# Patient Record
Sex: Female | Born: 1975 | Hispanic: Yes | Marital: Single | Smoking: Former smoker
Health system: Southern US, Community
[De-identification: ages and names within clinical notes are randomized; demographics above are authoritative.]

## PROBLEM LIST (undated history)

## (undated) DIAGNOSIS — I10 Essential (primary) hypertension: Secondary | ICD-10-CM

## (undated) DIAGNOSIS — D509 Iron deficiency anemia, unspecified: Secondary | ICD-10-CM

## (undated) DIAGNOSIS — M5136 Other intervertebral disc degeneration, lumbar region: Secondary | ICD-10-CM

## (undated) DIAGNOSIS — Z87442 Personal history of urinary calculi: Secondary | ICD-10-CM

## (undated) DIAGNOSIS — R011 Cardiac murmur, unspecified: Secondary | ICD-10-CM

## (undated) DIAGNOSIS — Z9889 Other specified postprocedural states: Secondary | ICD-10-CM

## (undated) DIAGNOSIS — F329 Major depressive disorder, single episode, unspecified: Secondary | ICD-10-CM

## (undated) DIAGNOSIS — F32A Depression, unspecified: Secondary | ICD-10-CM

## (undated) DIAGNOSIS — D649 Anemia, unspecified: Secondary | ICD-10-CM

## (undated) DIAGNOSIS — F419 Anxiety disorder, unspecified: Secondary | ICD-10-CM

## (undated) DIAGNOSIS — G4730 Sleep apnea, unspecified: Secondary | ICD-10-CM

## (undated) DIAGNOSIS — Z9289 Personal history of other medical treatment: Secondary | ICD-10-CM

## (undated) DIAGNOSIS — M5126 Other intervertebral disc displacement, lumbar region: Secondary | ICD-10-CM

## (undated) DIAGNOSIS — K219 Gastro-esophageal reflux disease without esophagitis: Secondary | ICD-10-CM

## (undated) DIAGNOSIS — Z973 Presence of spectacles and contact lenses: Secondary | ICD-10-CM

## (undated) DIAGNOSIS — M5030 Other cervical disc degeneration, unspecified cervical region: Secondary | ICD-10-CM

## (undated) DIAGNOSIS — R112 Nausea with vomiting, unspecified: Secondary | ICD-10-CM

## (undated) DIAGNOSIS — M5020 Other cervical disc displacement, unspecified cervical region: Secondary | ICD-10-CM

## (undated) DIAGNOSIS — M51369 Other intervertebral disc degeneration, lumbar region without mention of lumbar back pain or lower extremity pain: Secondary | ICD-10-CM

## (undated) DIAGNOSIS — Z8489 Family history of other specified conditions: Secondary | ICD-10-CM

## (undated) HISTORY — DX: Cardiac murmur, unspecified: R01.1

## (undated) HISTORY — DX: Personal history of other medical treatment: Z92.89

## (undated) HISTORY — PX: ERCP: SHX60

## (undated) HISTORY — PX: TUBAL LIGATION: SHX77

## (undated) HISTORY — PX: ELBOW SURGERY: SHX618

## (undated) HISTORY — DX: Iron deficiency anemia, unspecified: D50.9

## (undated) HISTORY — PX: DILATION AND CURETTAGE OF UTERUS: SHX78

## (undated) HISTORY — DX: Anemia, unspecified: D64.9

## (undated) HISTORY — PX: OTHER SURGICAL HISTORY: SHX169

---

## 2003-06-04 DIAGNOSIS — R011 Cardiac murmur, unspecified: Secondary | ICD-10-CM

## 2003-06-04 DIAGNOSIS — Z9289 Personal history of other medical treatment: Secondary | ICD-10-CM

## 2003-06-04 HISTORY — DX: Cardiac murmur, unspecified: R01.1

## 2003-06-04 HISTORY — DX: Personal history of other medical treatment: Z92.89

## 2006-02-15 ENCOUNTER — Other Ambulatory Visit: Payer: Self-pay

## 2006-02-15 ENCOUNTER — Emergency Department: Payer: Self-pay | Admitting: Emergency Medicine

## 2006-04-22 ENCOUNTER — Encounter: Payer: Self-pay | Admitting: Cardiovascular Disease

## 2007-01-22 ENCOUNTER — Emergency Department: Payer: Self-pay | Admitting: Emergency Medicine

## 2007-01-22 ENCOUNTER — Other Ambulatory Visit: Payer: Self-pay

## 2007-03-02 ENCOUNTER — Ambulatory Visit: Payer: Self-pay | Admitting: Family Medicine

## 2007-05-03 ENCOUNTER — Emergency Department: Payer: Self-pay | Admitting: Internal Medicine

## 2007-09-23 ENCOUNTER — Ambulatory Visit: Payer: Self-pay | Admitting: Gastroenterology

## 2008-06-03 HISTORY — PX: RENAL ENDOSCOPY VIA NEPHROSTOMY / PYELOSTOMY: SUR446

## 2008-06-03 HISTORY — PX: COLONOSCOPY: SHX174

## 2008-06-21 ENCOUNTER — Emergency Department: Payer: Self-pay | Admitting: Emergency Medicine

## 2008-06-30 ENCOUNTER — Ambulatory Visit: Payer: Self-pay | Admitting: Urology

## 2008-07-08 ENCOUNTER — Ambulatory Visit: Payer: Self-pay | Admitting: Urology

## 2008-07-14 ENCOUNTER — Ambulatory Visit: Payer: Self-pay | Admitting: Urology

## 2008-07-18 ENCOUNTER — Ambulatory Visit: Payer: Self-pay | Admitting: Urology

## 2008-07-21 ENCOUNTER — Ambulatory Visit: Payer: Self-pay | Admitting: Unknown Physician Specialty

## 2008-09-01 ENCOUNTER — Emergency Department: Payer: Self-pay

## 2008-09-16 DIAGNOSIS — N8000 Endometriosis of the uterus, unspecified: Secondary | ICD-10-CM | POA: Insufficient documentation

## 2008-10-06 ENCOUNTER — Ambulatory Visit: Payer: Self-pay | Admitting: Family Medicine

## 2008-11-04 ENCOUNTER — Ambulatory Visit: Payer: Self-pay | Admitting: Family Medicine

## 2008-12-21 ENCOUNTER — Ambulatory Visit: Payer: Self-pay | Admitting: Internal Medicine

## 2008-12-28 ENCOUNTER — Ambulatory Visit: Payer: Self-pay | Admitting: Gastroenterology

## 2009-01-04 ENCOUNTER — Encounter: Admission: RE | Admit: 2009-01-04 | Discharge: 2009-01-04 | Payer: Self-pay | Admitting: Neurological Surgery

## 2009-03-03 ENCOUNTER — Ambulatory Visit: Payer: Self-pay | Admitting: Urology

## 2009-05-10 ENCOUNTER — Ambulatory Visit: Payer: Self-pay | Admitting: Unknown Physician Specialty

## 2009-05-18 ENCOUNTER — Ambulatory Visit: Payer: Self-pay | Admitting: Urology

## 2009-06-14 ENCOUNTER — Ambulatory Visit: Payer: Self-pay | Admitting: Gastroenterology

## 2009-06-17 ENCOUNTER — Ambulatory Visit: Payer: Self-pay | Admitting: Family Medicine

## 2009-07-19 ENCOUNTER — Ambulatory Visit: Payer: Self-pay | Admitting: Family Medicine

## 2009-07-25 ENCOUNTER — Ambulatory Visit: Payer: Self-pay | Admitting: Gastroenterology

## 2009-07-25 LAB — HM COLONOSCOPY

## 2009-07-26 ENCOUNTER — Encounter: Payer: Self-pay | Admitting: Cardiovascular Disease

## 2009-07-26 ENCOUNTER — Emergency Department: Payer: Self-pay | Admitting: Emergency Medicine

## 2009-08-01 ENCOUNTER — Ambulatory Visit: Payer: Self-pay | Admitting: Family Medicine

## 2009-08-03 ENCOUNTER — Ambulatory Visit: Payer: Self-pay | Admitting: Gastroenterology

## 2009-08-08 ENCOUNTER — Ambulatory Visit: Payer: Self-pay | Admitting: Unknown Physician Specialty

## 2009-10-05 ENCOUNTER — Ambulatory Visit: Payer: Self-pay | Admitting: Unknown Physician Specialty

## 2009-10-19 DIAGNOSIS — K219 Gastro-esophageal reflux disease without esophagitis: Secondary | ICD-10-CM | POA: Insufficient documentation

## 2009-11-21 ENCOUNTER — Ambulatory Visit: Payer: Self-pay

## 2009-12-15 ENCOUNTER — Ambulatory Visit: Payer: Self-pay | Admitting: Rheumatology

## 2009-12-18 ENCOUNTER — Ambulatory Visit: Payer: Self-pay | Admitting: Family Medicine

## 2009-12-29 ENCOUNTER — Ambulatory Visit: Payer: Self-pay | Admitting: Cardiovascular Disease

## 2009-12-29 DIAGNOSIS — R079 Chest pain, unspecified: Secondary | ICD-10-CM | POA: Insufficient documentation

## 2009-12-29 DIAGNOSIS — R0602 Shortness of breath: Secondary | ICD-10-CM | POA: Insufficient documentation

## 2010-01-01 ENCOUNTER — Encounter: Payer: Self-pay | Admitting: Cardiovascular Disease

## 2010-01-03 ENCOUNTER — Ambulatory Visit: Payer: Self-pay | Admitting: Cardiovascular Disease

## 2010-01-04 ENCOUNTER — Encounter: Payer: Self-pay | Admitting: Cardiovascular Disease

## 2010-01-17 ENCOUNTER — Telehealth: Payer: Self-pay | Admitting: Cardiovascular Disease

## 2010-03-23 ENCOUNTER — Telehealth: Payer: Self-pay | Admitting: Cardiovascular Disease

## 2010-04-19 ENCOUNTER — Telehealth: Payer: Self-pay | Admitting: Cardiovascular Disease

## 2010-06-03 HISTORY — PX: GASTRIC BYPASS: SHX52

## 2010-06-08 ENCOUNTER — Ambulatory Visit: Payer: Self-pay | Admitting: Specialist

## 2010-07-03 NOTE — Assessment & Plan Note (Signed)
Summary: NP6/GLC   Visit Type:  Initial Consult Primary Provider:  Nilda Simmer, M.D.  CC:  shortness of breath and chest tightness.  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 She presents for evaluation.  She reports that she has had significant weight gain over the past year, approximately 100 pounds. She does not think that this is contributing to new onset shortness of breath over the past several months. 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 when she walks outside of work, she has difficulty with shortness of breath and has to stop. She does not think it is too to general deconditioning.  She used to smoke but stopped in 2001. She smoked for total of 8 years  EKG shows normal sinus rhythm with rate 74 beats per minute, no significant ST or T wave changes. EKG from February 2011 and shows normal sinus rhythm with rate 69 beats per minute.  Preventive Screening-Counseling & Management  Alcohol-Tobacco     Smoking Status: quit  Caffeine-Diet-Exercise     Does Patient Exercise: no  Current Medications (verified): 1)  Protonix 40 Mg Tbec (Pantoprazole Sodium) .... Take 1 Tablet By Mouth Once A Day  Allergies (verified): No Known Drug Allergies  Past History:  Past Medical History: heart murmur 2005 stress test 2005 in [place] showed MR & TR  Past Surgical History: gallbladder removed Laproscopy colonoscopy-2010 endoscopy-2010 tubal ligation  Social History: Full Time Married  Tobacco Use - Former. 1PPD x 8 years. Quit 2001. Regular Exercise - no Smoking Status:  quit Does Patient Exercise:  no  Review of Systems       The patient complains of chest pain, dyspnea on exertion, and peripheral edema.  The  patient denies fever, weight loss, weight gain, vision loss, decreased hearing, hoarseness, syncope, prolonged cough, abdominal pain, incontinence, muscle weakness, depression, and enlarged lymph nodes.         Fatigue  Vital Signs:  Patient profile:   35 year old female Height:      69 inches Weight:      272 pounds BMI:     40.31 Pulse rate:   81 / minute BP sitting:   155 / 102  (right arm) Cuff size:   large  Vitals Entered By: Benedict Needy, RN (December 29, 2009 11:39 AM)  Physical Exam  General:  Well developed, well nourished, in no acute distress. Head:  normocephalic and atraumatic Neck:  Neck supple, no JVD. No masses, thyromegaly or abnormal cervical nodes. Lungs:  Clear bilaterally to auscultation and percussion. Heart:  Non-displaced PMI, chest non-tender; regular rate and rhythm, S1, S2 with I-II SEM RSB, no rubs or gallops. Carotid upstroke normal, no bruit.  Pedals normal pulses. No edema, no varicosities. Abdomen:  Bowel sounds positive; abdomen soft and non-tender without masses Msk:  Back normal, normal gait. Muscle strength and tone normal. Pulses:  pulses normal in all 4 extremities Extremities:  No clubbing or cyanosis. Neurologic:  Alert and oriented x 3. Skin:  Intact without lesions or rashes. Psych:  Normal affect.   Impression & Recommendations:  Problem # 1:  CHEST PAIN UNSPECIFIED (ICD-786.50) she has a recent history of chest discomfort. She reports it happens more commonly with exertion. She is low risk as she is  not a diabetic, not actively smoking, is young. No significant family history of coronary artery disease. Negative stress test by her report in 2005 which sounds like a treadmill echo.  We have set her up for a routine treadmill exercise study to evaluate her heart rate with exercise, as well as her blood pressure.  Orders: EKG w/ Interpretation (93000) Echocardiogram (Echo) Treadmill (Treadmill)  Problem # 2:  DYSPNEA  (ICD-786.05) Etiology of her shortness of breath is uncertain. We have ordered an echocardiogram given her murmur is new symptoms of shortness of breath. This could also be due to weight gain, general deconditioned state. No clinical signs of significant pulmonary hypertension and she has no significant edema though we will confirm her right heart pressures on echocardiography.  Orders: EKG w/ Interpretation (93000) Echocardiogram (Echo) Treadmill (Treadmill)  Patient Instructions: 1)  Your physician recommends that you continue on your current medications as directed. Please refer to the Current Medication list given to you today. 2)  Your physician has requested that you have an echocardiogram.  Echocardiography is a painless test that uses sound waves to create images of your heart. It provides your doctor with information about the size and shape of your heart and how well your heart's chambers and valves are working.  This procedure takes approximately one hour. There are no restrictions for this procedure. 3)  Your physician has requested that you have an exercise tolerance test.  For further information please visit [email or web address]  Please also follow instruction sheet, as given.  Appended Document: NP6/GLC her blood pressure on repeat was 145/90. We have suggested that she start monitoring her blood pressure and heart rate at work. If her blood pressure continues to be moderately elevated, we would suggest starting a medication for blood pressure. We would also recommend increased exercise and diet for weight loss.

## 2010-07-03 NOTE — Letter (Signed)
Summary: Historic Patient File  Historic Patient File   Imported By: West Carbo 12/29/2009 13:40:07  _____________________________________________________________________  External Attachment:    Type:   Image     Comment:   External Document

## 2010-07-03 NOTE — Letter (Signed)
Summary: External Correspondence  External Correspondence   Imported By: West Carbo 12/29/2009 14:23:46  _____________________________________________________________________  External Attachment:    Type:   Image     Comment:   External Document

## 2010-07-03 NOTE — Progress Notes (Signed)
Summary: BP questions  Phone Note Call from Patient Call back at Home Phone [phone]   Caller: Patient Call For: RN Summary of Call: pt called with complaints of being lightheaded last night and today " just hasn't felt right". Hasnt had a chance to keep record of BP. Would like nurse to call to discuss medications that might need to be changed. Initial call taken by: Lysbeth Galas CMA,  April 19, 2010 1:57 PM  Follow-up for Phone Call        Called spoke with pt, pt states she is on Metoprolol one tablet two times a day. She does not have BP cuff at home, so she cannot check BP regularly, but pt reports feeling lightheaded, and head pounding like a HA coming on BP checked at that time 156/85, pulse ox O2 sat good, but HR jumping around 60-80. Pt has also been dx with sleep apnea and has a CPAP machine at home, but is unable to tolerate at present has requested sleep aid from , but has to come in for appt first and has pending OV next week.  Pt isn't getting much sleep at present works 3rd shift and is only sleeping for 2-3 hours.  Advised pt these symptoms she is experiencing may very likely be coming from sleep deprivation, and quality of sleep as well as decr oxygenation while at rest.  Advised pt to address sleep issues with ASAP and try to monitor BP regularly at work is able to check at work 5 days a week 8 hr shift. Asked pt to check at beginning and end of shift consistantly with same BP cuff and record and call us with readings. Follow-up by: Cloyde Reams RN,  April 19, 2010 3:34 PM  Additional Follow-up for Phone Call Additional follow up Details #1::        Sounds good. Will talk to her in clinic

## 2010-07-03 NOTE — Progress Notes (Signed)
Summary: QUESTION FOR MD  Phone Note Call from Patient Call back at Home Phone [phone]   Caller: Patient Call For: GOLLAN Summary of Call: PATIENT CALLED AND SHE IS STILL FEELING SOB AND NOT SURE IF THE BP MEDICATIONS ARE WORKING, WOULD LIKE FOR RN TO CALL HER BACK. 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 Benedict Needy, RN  January 17, 2010 3:38 PM   bp 150/89 HR 99 pt to increase metoprolol 1 1/2 -2 tabs daily. Will monitor BP and call nurse with results.  Follow-up by: Benedict Needy, RN,  January 19, 2010 4:55 PM     Appended Document: QUESTION FOR MD Called to check on pt's BP and HR LMOM TCB

## 2010-07-03 NOTE — Progress Notes (Signed)
Summary: PROBLEMS  Phone Note Call from Patient Call back at Home Phone [phone]   Caller: self Call For: [facility] Summary of Call: PT CALLED A MONTH AGO ABOUT BP-WAS GIVEN A BP MEDICATION-DOES NOT FEEL LIKE MEDICATION IS HELPING-FEELS LIKE HR GOES UP AND DOWN WHILE SLEEPING-SOB DURING THE DAY Initial call taken by: Harlon Flor,  March 23, 2010 8:26 AM  Follow-up for Phone Call        Pt reports that sher BP has been in the 150's has been taking it but not writing it down. Asked pt to monitor her BP x2 day through the weekend and also record pulses and call me on monday. Benedict Needy, RN  March 23, 2010 10:21 AM   LMOM TCB Benedict Needy, RN  March 27, 2010 3:54 PM  Attempted TCB.  LMOM TCB. Cloyde Reams RN  March 30, 2010 4:47 PM   Follow-up by: Cloyde Reams RN,  March 30, 2010 4:47 PM

## 2010-07-03 NOTE — Letter (Signed)
Summary: Historic Patient File  Historic Patient File   Imported By: West Carbo 01/01/2010 09:54:47  _____________________________________________________________________  External Attachment:    Type:   Image     Comment:   External Document

## 2010-07-03 NOTE — Letter (Signed)
Summary: Historic Patient File  Historic Patient File   Imported By: West Carbo 01/01/2010 09:42:13  _____________________________________________________________________  External Attachment:    Type:   Image     Comment:   External Document

## 2010-07-03 NOTE — Miscellaneous (Signed)
Summary: Metoprolol  Clinical Lists Changes  Medications: Added new medication of METOPROLOL TARTRATE 25 MG TABS (METOPROLOL TARTRATE) Take one tablet by mouth twice a day - Signed Rx of METOPROLOL TARTRATE 25 MG TABS (METOPROLOL TARTRATE) Take one tablet by mouth twice a day;  #60 x 4;  Signed;  Entered by: Benedict Needy, RN;  Authorized by: Dossie Arbour MD;  Method used: Electronically to [facility] [address] [id number] [address], [place], [place]  [address], Ph: [phone], Fax: [phone]    Prescriptions: METOPROLOL TARTRATE 25 MG TABS (METOPROLOL TARTRATE) Take one tablet by mouth twice a day  #60 x 4   Entered by:   Benedict Needy, RN   Authorized by:   Dossie Arbour MD   Signed by:   Benedict Needy, RN on 01/04/2010   Method used:   Electronically to        [facility] [address] [phone]* (retail)       [address] [place]  [address]       Ph: [phone]       Fax: [phone]   RxID:   [id number]

## 2010-07-04 ENCOUNTER — Ambulatory Visit: Payer: Self-pay | Admitting: Specialist

## 2010-07-09 ENCOUNTER — Ambulatory Visit: Payer: Self-pay | Admitting: Bariatrics

## 2010-08-02 ENCOUNTER — Ambulatory Visit: Payer: Self-pay | Admitting: Specialist

## 2010-09-02 ENCOUNTER — Ambulatory Visit: Payer: Self-pay | Admitting: Specialist

## 2010-10-02 ENCOUNTER — Ambulatory Visit: Payer: Self-pay | Admitting: Specialist

## 2010-10-23 ENCOUNTER — Ambulatory Visit: Payer: Self-pay | Admitting: Family Medicine

## 2010-10-29 ENCOUNTER — Ambulatory Visit: Payer: Self-pay | Admitting: Gastroenterology

## 2010-11-02 ENCOUNTER — Ambulatory Visit: Payer: Self-pay | Admitting: Specialist

## 2010-11-28 ENCOUNTER — Ambulatory Visit: Payer: Self-pay | Admitting: Bariatrics

## 2010-12-11 ENCOUNTER — Inpatient Hospital Stay: Payer: Self-pay | Admitting: Bariatrics

## 2010-12-18 ENCOUNTER — Encounter: Payer: Self-pay | Admitting: Cardiovascular Disease

## 2011-03-04 ENCOUNTER — Emergency Department: Payer: Self-pay | Admitting: Emergency Medicine

## 2011-03-25 ENCOUNTER — Encounter: Payer: Self-pay | Admitting: Cardiovascular Disease

## 2011-03-25 ENCOUNTER — Ambulatory Visit (INDEPENDENT_AMBULATORY_CARE_PROVIDER_SITE_OTHER): Payer: PRIVATE HEALTH INSURANCE | Admitting: Cardiovascular Disease

## 2011-03-25 DIAGNOSIS — R0602 Shortness of breath: Secondary | ICD-10-CM

## 2011-03-25 DIAGNOSIS — R42 Dizziness and giddiness: Secondary | ICD-10-CM

## 2011-03-25 DIAGNOSIS — R002 Palpitations: Secondary | ICD-10-CM | POA: Insufficient documentation

## 2011-03-25 MED ORDER — FLUDROCORTISONE ACETATE 0.1 MG PO TABS: 0.1000 mg | ORAL_TABLET | Freq: Every day | ORAL | Status: AC

## 2011-03-25 NOTE — Assessment & Plan Note (Signed)
I am concerned about her dizziness in the setting of recent weight loss of 60 pounds. Blood pressure is borderline low. Symptoms are likely secondary to orthostasis. She reports having some of these symptoms prior to her surgery as well. Given that they were there prior to surgery and they have been chronic, my suspicion is that they are from low blood pressure exacerbated when she stands up. We have encouraged she increase her fluid intake, salt intake and we will start Florinef 0.1 mg 3 times a week, titrating to daily if needed for symptoms.

## 2011-03-25 NOTE — Patient Instructions (Signed)
You are doing well. Please start florinef three times a week. Continue for a few weeks, If you continue to have dizzy spells, increase the dose to daily.  We will send a order for a holter that you can pick when you have palpitations.  Please call us if you have new issues that need to be addressed before your next appt.  The office will contact you for a follow up Appt. In 6 months

## 2011-03-25 NOTE — Assessment & Plan Note (Addendum)
Frequent symptoms of palpitations. We have put in an order for a Holter monitor that she can wear at [facility] on days when she has significant  Palpitations. We will see the Holter once it has been completed. Blood pressure is too low for low-dose beta blockers. We have started Florinef.

## 2011-03-25 NOTE — Assessment & Plan Note (Signed)
She does have occasional palpitations and dizziness. They seem to go together. Uncertain if she is having arrhythmia or ectopy. She does not appear to have CHF on her presentation today.

## 2011-03-25 NOTE — Progress Notes (Signed)
Patient ID: Misty Zamora, female    DOB: 02/17/1976, 35 y.o.   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 She continues to work at [employer].   She states that she has lost 60 pounds since her gastric bypass surgery. Prior to the surgery, she had symptoms of dizziness, most notably when she was standing from a sitting position. She continues to have episodes of dizziness that are positional. Also has symptoms of palpitations and shortness of breath. They are not every day, possibly twice per week. Recently she had feelings of malaise, in the evening had a pulsation in her ear when going to bed. In the morning she felt weak and dizzy after standing up. She has noticed fluctuations in her pulses from the high 50s to 80 while at rest.   She used to smoke but stopped in 2001. She smoked for total of 8 years  She was seen in the emergency room on October 1 for palpitations. Workup was negative including negative chest x-ray, blood work. TSH 0.7. EKG was essentially normal  EKG today shows normal sinus rhythm with rate 70 beats per minute with no significant ST-T wave changes    Outpatient Encounter Prescriptions as of 03/25/2011  Medication Sig Dispense Refill  . DISCONTD: metoprolol tartrate (LOPRESSOR) 25 MG tablet Take 25 mg by mouth 2 (two) times daily.        Marland Kitchen DISCONTD: pantoprazole (PROTONIX) 40 MG tablet Take 40 mg by mouth daily.           Review of Systems  Constitutional: Negative.   HENT: Negative.   Eyes: Negative.   Respiratory: Positive for shortness of breath.   Cardiovascular: Positive for palpitations.  Gastrointestinal: Negative.   Musculoskeletal: Negative.   Skin: Negative.     Neurological: Positive for dizziness.  Hematological: Negative.   Psychiatric/Behavioral: Negative.   All other systems reviewed and are negative.    BP 115/80  Pulse 70  Ht 5\' 9"  (1.753 m)  Wt 214 lb (97.07 kg)  BMI 31.60 kg/m2  Physical Exam  Nursing note and vitals reviewed. Constitutional: She is oriented to person, place, and time. She appears well-developed and well-nourished.       obese  HENT:  Head: Normocephalic.  Nose: Nose normal.  Mouth/Throat: Oropharynx is clear and moist.  Eyes: Conjunctivae are normal. Pupils are equal, round, and reactive to light.  Neck: Normal range of motion. Neck supple. No JVD present.  Cardiovascular: Normal rate, regular rhythm, S1 normal, S2 normal, normal heart sounds and intact distal pulses.  Exam reveals no gallop and no friction rub.   No murmur heard. Pulmonary/Chest: Effort normal and breath sounds normal. No respiratory distress. She has no wheezes. She has no rales. She exhibits no tenderness.  Abdominal: Soft. Bowel sounds are normal. She exhibits no distension. There is no tenderness.  Musculoskeletal: Normal range of motion. She exhibits no edema and no tenderness.  Lymphadenopathy:    She has no cervical adenopathy.  Neurological: She is alert and oriented to person, place, and time. Coordination normal.  Skin: Skin is warm and dry. No rash noted. No erythema.  Psychiatric: She has a normal mood and affect. Her behavior is normal. Judgment and thought content normal.  Assessment and Plan

## 2011-05-03 ENCOUNTER — Ambulatory Visit: Payer: Self-pay | Admitting: Family Medicine

## 2011-08-26 ENCOUNTER — Ambulatory Visit: Payer: Self-pay | Admitting: Bariatrics

## 2011-08-26 LAB — COMPREHENSIVE METABOLIC PANEL
Alkaline Phosphatase: 67 U/L (ref 50–136)
Anion Gap: 10 (ref 7–16)
BUN: 7 mg/dL (ref 7–18)
Calcium, Total: 8.5 mg/dL (ref 8.5–10.1)
Glucose: 78 mg/dL (ref 65–99)
Osmolality: 284 (ref 275–301)
SGPT (ALT): 22 U/L
Total Protein: 7.1 g/dL (ref 6.4–8.2)

## 2011-08-26 LAB — LIPASE, BLOOD: Lipase: 158 U/L (ref 73–393)

## 2011-08-26 LAB — CBC WITH DIFFERENTIAL/PLATELET
Eosinophil #: 0.1 10*3/uL (ref 0.0–0.7)
Eosinophil %: 1 %
Lymphocyte %: 39.4 %
Monocyte #: 0.2 10*3/uL (ref 0.0–0.7)
Neutrophil #: 3.7 10*3/uL (ref 1.4–6.5)
Neutrophil %: 54.6 %
Platelet: 208 10*3/uL (ref 150–440)
RBC: 4.06 10*6/uL (ref 3.80–5.20)
WBC: 6.7 10*3/uL (ref 3.6–11.0)

## 2011-08-26 LAB — AMYLASE: Amylase: 51 U/L (ref 25–115)

## 2011-08-26 LAB — MAGNESIUM: Magnesium: 1.8 mg/dL

## 2011-08-26 LAB — RETICULOCYTES
Absolute Retic Count: 0.115 10*6/uL — ABNORMAL HIGH (ref 0.024–0.084)
Reticulocyte: 2.9 % — ABNORMAL HIGH (ref 0.5–1.5)

## 2011-08-27 LAB — FOLATE: Folic Acid: 9.8 ng/mL (ref 3.1–100.0)

## 2011-08-27 LAB — IRON AND TIBC
Iron Bind.Cap.(Total): 402 ug/dL (ref 250–450)
Iron: 66 ug/dL (ref 50–170)

## 2011-09-19 ENCOUNTER — Emergency Department: Payer: Self-pay | Admitting: Emergency Medicine

## 2011-09-20 LAB — CBC
HCT: 38.7 % (ref 35.0–47.0)
MCH: 29.7 pg (ref 26.0–34.0)
MCHC: 33.5 g/dL (ref 32.0–36.0)
Platelet: 189 10*3/uL (ref 150–440)

## 2011-09-20 LAB — URINALYSIS, COMPLETE
Bilirubin,UR: NEGATIVE
Glucose,UR: NEGATIVE mg/dL (ref 0–75)
Nitrite: NEGATIVE
Protein: 30
RBC,UR: 1241 /HPF (ref 0–5)
Specific Gravity: 1.027 (ref 1.003–1.030)
WBC UR: 3 /HPF (ref 0–5)

## 2011-09-20 LAB — BASIC METABOLIC PANEL
Anion Gap: 8 (ref 7–16)
BUN: 10 mg/dL (ref 7–18)
Chloride: 105 mmol/L (ref 98–107)
Creatinine: 0.71 mg/dL (ref 0.60–1.30)
EGFR (African American): >60
EGFR (Non-African Amer.): >60
Glucose: 109 mg/dL — ABNORMAL HIGH (ref 65–99)
Osmolality: 279 (ref 275–301)

## 2011-09-23 LAB — URINE CULTURE

## 2011-09-25 ENCOUNTER — Ambulatory Visit: Payer: Self-pay | Admitting: Family Medicine

## 2011-09-25 LAB — COMPREHENSIVE METABOLIC PANEL
Albumin: 3.9 g/dL (ref 3.4–5.0)
Alkaline Phosphatase: 64 U/L (ref 50–136)
Calcium, Total: 8.6 mg/dL (ref 8.5–10.1)
Chloride: 106 mmol/L (ref 98–107)
Co2: 28 mmol/L (ref 21–32)
EGFR (Non-African Amer.): >60
SGOT(AST): 22 U/L (ref 15–37)

## 2011-09-25 LAB — CBC WITH DIFFERENTIAL/PLATELET
Eosinophil #: 0.1 10*3/uL (ref 0.0–0.7)
Eosinophil %: 1.2 %
HGB: 12.6 g/dL (ref 12.0–16.0)
Lymphocyte %: 35 %
Neutrophil %: 59.4 %
RBC: 4.22 10*6/uL (ref 3.80–5.20)
RDW: 13.6 % (ref 11.5–14.5)
WBC: 7.5 10*3/uL (ref 3.6–11.0)

## 2011-11-04 ENCOUNTER — Ambulatory Visit: Payer: Self-pay | Admitting: Gastroenterology

## 2012-01-22 ENCOUNTER — Inpatient Hospital Stay: Payer: Self-pay | Admitting: Student

## 2012-01-22 LAB — CBC WITH DIFFERENTIAL/PLATELET
Basophil %: 1 %
Eosinophil %: 1.1 %
HCT: 36 % (ref 35.0–47.0)
HGB: 12.6 g/dL (ref 12.0–16.0)
MCH: 30.5 pg (ref 26.0–34.0)
MCV: 87 fL (ref 80–100)
Monocyte #: 0.5 x10 3/mm (ref 0.2–0.9)
Neutrophil #: 4.8 10*3/uL (ref 1.4–6.5)
Neutrophil %: 59.6 %
RBC: 4.15 10*6/uL (ref 3.80–5.20)

## 2012-01-22 LAB — COMPREHENSIVE METABOLIC PANEL
Anion Gap: 6 — ABNORMAL LOW (ref 7–16)
Bilirubin,Total: 0.5 mg/dL (ref 0.2–1.0)
Chloride: 105 mmol/L (ref 98–107)
Co2: 30 mmol/L (ref 21–32)
Creatinine: 0.82 mg/dL (ref 0.60–1.30)
EGFR (African American): >60
EGFR (Non-African Amer.): >60
Osmolality: 277 (ref 275–301)
Potassium: 3.4 mmol/L — ABNORMAL LOW (ref 3.5–5.1)
Sodium: 141 mmol/L (ref 136–145)

## 2012-01-22 LAB — URINALYSIS, COMPLETE
Bacteria: NONE SEEN
Blood: NEGATIVE
Glucose,UR: NEGATIVE mg/dL (ref 0–75)
Leukocyte Esterase: NEGATIVE
Nitrite: NEGATIVE
Protein: NEGATIVE
Specific Gravity: 1.029 (ref 1.003–1.030)

## 2012-01-22 LAB — TROPONIN I
Troponin-I: <0.02 ng/mL
Troponin-I: <0.02 ng/mL

## 2012-01-23 LAB — CBC WITH DIFFERENTIAL/PLATELET
Basophil %: 0.9 %
HCT: 33.7 % — ABNORMAL LOW (ref 35.0–47.0)
Lymphocyte #: 2.3 10*3/uL (ref 1.0–3.6)
Lymphocyte %: 45.3 %
MCHC: 34.2 g/dL (ref 32.0–36.0)
Monocyte #: 0.3 x10 3/mm (ref 0.2–0.9)
Neutrophil %: 44.2 %
Platelet: 170 10*3/uL (ref 150–440)
RDW: 13.6 % (ref 11.5–14.5)

## 2012-01-23 LAB — COMPREHENSIVE METABOLIC PANEL
Alkaline Phosphatase: 91 U/L (ref 50–136)
Bilirubin,Total: 0.9 mg/dL (ref 0.2–1.0)
Chloride: 108 mmol/L — ABNORMAL HIGH (ref 98–107)
Co2: 27 mmol/L (ref 21–32)
Creatinine: 0.79 mg/dL (ref 0.60–1.30)
EGFR (African American): >60
EGFR (Non-African Amer.): >60
Sodium: 140 mmol/L (ref 136–145)

## 2012-01-23 LAB — LIPID PANEL
HDL Cholesterol: 39 mg/dL — ABNORMAL LOW (ref 40–60)
VLDL Cholesterol, Calc: 10 mg/dL (ref 5–40)

## 2012-01-29 DIAGNOSIS — R945 Abnormal results of liver function studies: Secondary | ICD-10-CM | POA: Insufficient documentation

## 2012-01-29 DIAGNOSIS — R7989 Other specified abnormal findings of blood chemistry: Secondary | ICD-10-CM | POA: Insufficient documentation

## 2012-01-29 DIAGNOSIS — G8929 Other chronic pain: Secondary | ICD-10-CM | POA: Insufficient documentation

## 2012-03-04 ENCOUNTER — Other Ambulatory Visit: Payer: Self-pay

## 2012-03-04 LAB — RENAL FUNCTION PANEL
Calcium, Total: 8.6 mg/dL (ref 8.5–10.1)
Chloride: 103 mmol/L (ref 98–107)
Co2: 30 mmol/L (ref 21–32)
Creatinine: 0.68 mg/dL (ref 0.60–1.30)
EGFR (Non-African Amer.): >60
Potassium: 4.1 mmol/L (ref 3.5–5.1)
Sodium: 139 mmol/L (ref 136–145)

## 2012-03-04 LAB — CBC WITH DIFFERENTIAL/PLATELET
Eosinophil %: 3.3 %
HCT: 35.6 % (ref 35.0–47.0)
Lymphocyte #: 2 10*3/uL (ref 1.0–3.6)
MCV: 86 fL (ref 80–100)
Monocyte %: 5.7 %
Neutrophil #: 3 10*3/uL (ref 1.4–6.5)
RBC: 4.15 10*6/uL (ref 3.80–5.20)
WBC: 5.6 10*3/uL (ref 3.6–11.0)

## 2012-06-12 ENCOUNTER — Ambulatory Visit: Payer: Self-pay

## 2012-07-11 ENCOUNTER — Other Ambulatory Visit: Payer: Self-pay | Admitting: Gastroenterology

## 2012-07-11 LAB — HEPATIC FUNCTION PANEL A (ARMC)
Albumin: 4 g/dL (ref 3.4–5.0)
Alkaline Phosphatase: 62 U/L (ref 50–136)
Bilirubin, Direct: 0.2 mg/dL (ref 0.00–0.20)
Bilirubin,Total: 0.6 mg/dL (ref 0.2–1.0)
SGOT(AST): 14 U/L — ABNORMAL LOW (ref 15–37)
SGPT (ALT): 19 U/L (ref 12–78)
Total Protein: 7.6 g/dL (ref 6.4–8.2)

## 2012-08-05 ENCOUNTER — Ambulatory Visit: Payer: Self-pay | Admitting: Gastroenterology

## 2012-09-30 ENCOUNTER — Ambulatory Visit: Payer: Self-pay | Admitting: Family Medicine

## 2013-06-21 ENCOUNTER — Other Ambulatory Visit: Payer: Self-pay | Admitting: Family Medicine

## 2013-06-21 ENCOUNTER — Ambulatory Visit: Payer: Self-pay | Admitting: Family Medicine

## 2013-06-21 LAB — COMPREHENSIVE METABOLIC PANEL
Albumin: 3.7 g/dL (ref 3.4–5.0)
Alkaline Phosphatase: 58 U/L
Anion Gap: 3 — ABNORMAL LOW (ref 7–16)
BUN: 5 mg/dL — ABNORMAL LOW (ref 7–18)
Bilirubin,Total: 0.4 mg/dL (ref 0.2–1.0)
CALCIUM: 8.8 mg/dL (ref 8.5–10.1)
Chloride: 104 mmol/L (ref 98–107)
Co2: 28 mmol/L (ref 21–32)
Creatinine: 0.62 mg/dL (ref 0.60–1.30)
EGFR (African American): >60
EGFR (Non-African Amer.): >60
Glucose: 83 mg/dL (ref 65–99)
Osmolality: 266 (ref 275–301)
Potassium: 3.9 mmol/L (ref 3.5–5.1)
SGOT(AST): 20 U/L (ref 15–37)
SGPT (ALT): 17 U/L (ref 12–78)
SODIUM: 135 mmol/L — AB (ref 136–145)
TOTAL PROTEIN: 7.4 g/dL (ref 6.4–8.2)

## 2013-06-21 LAB — CBC WITH DIFFERENTIAL/PLATELET
BASOS ABS: 0.1 10*3/uL (ref 0.0–0.1)
Basophil %: 1.3 %
EOS PCT: 1.2 %
Eosinophil #: 0.1 10*3/uL (ref 0.0–0.7)
HCT: 34.8 % — ABNORMAL LOW (ref 35.0–47.0)
HGB: 11.5 g/dL — ABNORMAL LOW (ref 12.0–16.0)
Lymphocyte #: 2 10*3/uL (ref 1.0–3.6)
Lymphocyte %: 30 %
MCH: 24.6 pg — ABNORMAL LOW (ref 26.0–34.0)
MCHC: 32.9 g/dL (ref 32.0–36.0)
MCV: 75 fL — ABNORMAL LOW (ref 80–100)
MONOS PCT: 5.1 %
Monocyte #: 0.3 x10 3/mm (ref 0.2–0.9)
NEUTROS ABS: 4.1 10*3/uL (ref 1.4–6.5)
NEUTROS PCT: 62.4 %
Platelet: 219 10*3/uL (ref 150–440)
RBC: 4.67 10*6/uL (ref 3.80–5.20)
RDW: 16 % — AB (ref 11.5–14.5)
WBC: 6.7 10*3/uL (ref 3.6–11.0)

## 2013-06-21 LAB — AMYLASE: Amylase: 60 U/L (ref 25–115)

## 2013-06-21 LAB — LIPASE, BLOOD: Lipase: 112 U/L (ref 73–393)

## 2013-11-15 DIAGNOSIS — N201 Calculus of ureter: Secondary | ICD-10-CM | POA: Insufficient documentation

## 2014-04-25 ENCOUNTER — Encounter: Payer: Self-pay | Admitting: Nurse Practitioner

## 2014-05-03 ENCOUNTER — Encounter: Payer: Self-pay | Admitting: Nurse Practitioner

## 2014-05-13 ENCOUNTER — Ambulatory Visit: Payer: Self-pay | Admitting: Family Medicine

## 2014-05-16 ENCOUNTER — Ambulatory Visit: Payer: Self-pay | Admitting: Nurse Practitioner

## 2014-05-18 ENCOUNTER — Ambulatory Visit: Payer: Self-pay | Admitting: Gastroenterology

## 2014-06-03 ENCOUNTER — Encounter: Payer: Self-pay | Admitting: Nurse Practitioner

## 2014-06-14 ENCOUNTER — Ambulatory Visit: Payer: Self-pay | Admitting: Family Medicine

## 2014-06-21 ENCOUNTER — Ambulatory Visit: Payer: Self-pay | Admitting: Surgery

## 2014-08-03 ENCOUNTER — Ambulatory Visit: Payer: Self-pay | Admitting: Obstetrics and Gynecology

## 2014-08-09 ENCOUNTER — Ambulatory Visit: Payer: Self-pay | Admitting: Obstetrics and Gynecology

## 2014-08-28 ENCOUNTER — Emergency Department: Payer: Self-pay | Admitting: Emergency Medicine

## 2014-09-13 ENCOUNTER — Telehealth: Payer: Self-pay | Admitting: Cardiovascular Disease

## 2014-09-13 NOTE — Telephone Encounter (Signed)
Spoke w/ pt.  She reports intermittent chest pain, she describes as a squeezing sensation in the center of her chest that is accompanied by nausea. Reports that sx have been present for some time now, but are increasing in frequency. Reports that her BP has been WNL, 110/52, which is her normal since her gastric bypass surgery.  Reports continued dizziness. Pt has not been seen since 2012, but reports that she has been doing well and has not needed to be seen until now.  Advised her that Dr. Rockey Situ is not in the office today. Suggested that she call her PCP's office and see if they can do an EKG or for her to proceed to the ED if sx become emergent.  She verbalizes understanding and will call back if we can be of further assistance.

## 2014-09-13 NOTE — Telephone Encounter (Signed)
Pt c/o of Chest Pain: STAT if CP now or developed within 24 hours  1. Are you having CP right now? sqeezing now but not painful right now last episode was earlier this morning 8am   2. Are you experiencing any other symptoms (ex. SOB, nausea, vomiting, sweating)? nausea  3. How long have you been experiencing CP? Couple of weeks, getting more frequent   4. Is your CP continuous or coming and going? Comes and goes   5. Have you taken Nitroglycerin? No, no rx patient has not taken any med   Episodes last about 5-10 minutes center of chest between breast.   ?

## 2014-09-20 NOTE — H&P (Signed)
PATIENT NAME:  Misty Zamora, OLDS MR#:  [id number] DATE OF BIRTH:  11/05/1975  DATE OF ADMISSION:  01/22/2012  PRIMARY CARE PHYSICIAN: [facility]   ER PHYSICIAN: Dr. Renee Ramus    ADMITTING PHYSICIAN: Dr. Pearletha Furl   GASTROENTEROLOGIST: Dr. Candace Cruise   PRESENTING COMPLAINT: Abdominal pain four hours ago.  HISTORY OF PRESENT ILLNESS: The patient is a 39 year old lady who has been having recurrent abdominal pain. This is about the sixth time this year. She was seen by Dr. Candace Cruise, gastroenterologist, a few months ago and was sent to [facility] for evaluation as she had prior history of gastric bypass and ERCP was unable to be done here. At [facility] she was scheduled for HIDA scan in one week's time. However, since returning home from [place] yesterday this early morning she started having severe abdominal pain. Pain was sharp in nature, intensity 10/10 radiating to the back. Admits to some nausea with some retching but no vomitus. Denies any fever. No loss of consciousness. No dizziness. No cold sweating or palpitations. No chest pain. With persistent severe nausea and pain she presented to the Emergency Room today where work-up included a CT of the abdomen and pelvis which showed dilated common bile duct with findings suggestive of mesenteric lymphadenitis and lipase level was elevated and she was referred to hospitalist for admission for pancreatitis. The patient denies any recent change in medication, long distance travel, or sick contacts.   REVIEW OF SYSTEMS: CONSTITUTIONAL: Positive for fatigue and weakness but no weight loss or weight gain. EYES: No blurred vision, redness, or discharge. ENT: No tinnitus, epistaxis, or difficulty swallowing. RESPIRATORY: No cough or shortness of breath. CARDIOVASCULAR: Denies chest pain, palpitations, or syncope. GI: Positive for nausea and severe abdominal pain. GU: No dysuria, frequency, or incontinence. ENDOCRINE: No polyuria, polydipsia, heat or cold intolerance, or  excessive thirst. HEMATOLOGIC: No anemia, easy bruising, bleeding, or swollen glands. SKIN: No rashes, change in hair or skin texture. MUSCULOSKELETAL: No joint pain, redness, or limited activity. NEUROLOGIC: No numbness, weakness, vertigo, or seizure. PSYCH: No anxiety or depression.   PAST MEDICAL HISTORY:  1. History of anxiety. 2. Gastroesophageal reflux disease.   3. Prior history of morbid obesity. 4. Obstructive sleep apnea. 5. Hypertension. 6. Status post gastric bypass with resolution of obesity, obstructive sleep apnea, and hypertension.  7. History of thyroid nodules.   PAST SURGICAL HISTORY:  1. Gastric bypass one year ago.  2. Internal hemorrhoids.  3. Laparoscopic cholecystectomy. 4. BTL. 5. D and C.  SOCIAL HISTORY: Lives at home with her family. Remote history of smoking, quit more than 12 years ago. No alcohol or recreational drug use. 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   FAMILY HISTORY: Diabetes, hypertension, coronary artery disease.    ALLERGIES: Percocet causes itching.   MEDICATIONS: Multivitamin.   PHYSICAL EXAMINATION:   VITAL SIGNS: Temperature 99, pulse 69, respiratory rate 18, blood pressure 122/78, oxygen sat 98 on room air.   GENERAL: Young lady laying on the gurney awake, alert, oriented to time, place, and person in mild painful distress.   HEENT: Atraumatic, normocephalic. Pupils equal, reactive to light and accommodation. Extraocular movements intact. Mucous membranes pink, moist.   NECK: Supple. No JV distention.   CHEST: Good air entry. Clear to auscultation.   HEART: Regular rate and rhythm. No murmurs.   ABDOMEN: Full, moves with respiration. Laparoscopic scars seen. Mild tenderness in right upper quadrant. No rebound. No guarding. Bowel sounds normoactive. No organomegaly.   EXTREMITIES: No  edema. No clubbing. No deformity.   NEUROLOGICAL: No focal motor or sensory deficits.   PSYCH: Affect appropriate to situation.    LABORATORY, DIAGNOSTIC, AND RADIOLOGICAL DATA: CT of the abdomen and pelvis showed dilated common bile duct unchanged from previous CT study from April of this year, findings suggestive of mesenteric lymphadenitis.   CBC white count 8, hemoglobin 12, platelets 211. Chemistry creatinine 0.8, BUN 6, potassium 3.4 down from 4.1, anion gap 6, glucose 71, calcium 8.7. Lipase 1175. AST 127. Urinalysis is negative.   IMPRESSION:  1. Acute abdominal pain. 2. Acute pancreatitis. 3. Mild hypokalemia.   PLAN:  1. Admit to general medical floor. 2. IV fluid hydration.  3. N.p.o. for bowel rest. 4. Pain control as needed.  5. GI evaluation by Dr. Candace Cruise.   CODE STATUS: FULL CODE.      TOTAL PATIENT CARE TIME: 50 minutes.   ____________________________ Jules Husbands Pearletha Furl, MD mia:drc D: 01/22/2012 08:30:12 ET T: 01/22/2012 08:55:30 ET JOB#: [id number]  cc: Ledarrius Beauchaine I. Pearletha Furl, MD, <Dictator> [place] MD ELECTRONICALLY SIGNED 01/23/2012 0:58

## 2014-09-20 NOTE — Discharge Summary (Signed)
PATIENT NAME:  Misty Zamora, Misty Zamora MR#:  [id number] DATE OF BIRTH:  [birth year]  DATE OF ADMISSION:  01/22/2012 DATE OF DISCHARGE:  01/23/2012  CONSULTANT: Dr. Verdie Shire from GI.   PRIMARY CARE PHYSICIAN: [facility]  CHIEF COMPLAINT: Abdominal pain.   DISCHARGE DIAGNOSES:  1. Acute pancreatitis. 2. Elevated liver enzymes likely from pancreatitis. 3. History of gastric bypass surgery. 4. Hypokalemia. 5. Dilated common bile duct. 6. History of prior embolus.  7. Morbid obesity. 8. History of gastroesophageal reflux disease. 9. History of anxiety. 10. History of thyroid nodule.   DISCHARGE MEDICATIONS: Multivitamin 1 tab daily.   DISPOSITION: Home.   DIET: GI soft.   FOLLOW UP: Please follow up with your [facility] gastroenterologist within a week for further GI work-up including ERCP. Please follow up with your primary care physician within a week to two weeks.   DISPOSITION: Home.   CODE STATUS: FULL CODE.   HISTORY OF PRESENT ILLNESS/HOSPITAL COURSE: For full details of the history and physical, please see the dictation on 01/22/2012 by Dr. Pearletha Furl but briefly this is a pleasant 39 year old female with history of morbid obesity in the past status post gastric bypass with resolution of her obstructive sleep apnea, hypertension and obesity who presented with abdominal pain. Patient apparently has been having off and on chronic abdominal pain for greater than a year, however, now feels it has been occurring more frequently. Patient on arrival was noted to have elevated lipase greater than 1000 and admitted to the hospitalist service for pancreatitis and further work-up. Patient was made n.p.o, started on IV fluids. Patient also was noted to have hypokalemia of 3.4 and that was repleted. GI was consulted. Patient also has mild elevation of AST of 127 on arrival. Patient had negative troponins x3. Initial CT of the abdomen and pelvis with contrast had shown distention of the common  bile duct, unchanged from prior study with some mild bladder distention. Patient had bladder scan which was less than 100 mL. There are no other acute abnormalities in the CT of the abdomen and pelvis. Patient was also started on pain medications and antiemetics. The next day her lipase normalized. Patient did not have any further abdominal pain. Upper GI and M.R.C.P. was ordered. Patient also has been following at [facility] for her CBD and abdominal pain. A HIDA scan has been scheduled for Tuesday. An M.R.C.P. was done here which did not show any CBD obstruction. There is slightly more prominence of the CBD than prior. Pancreas was unremarkable. Pancreatic duct was normal as well. At this point with resolution of the abdominal pain and normalization of lipase she would be discharged with outpatient follow up with [facility] for ERCP and possible HIDA scan.   TOTAL TIME SPENT: 40 minutes.  ____________________________ Vivien Presto, MD sa:cms D: 01/23/2012 17:37:13 ET T: 01/24/2012 10:36:52 ET JOB#: [id number]  cc: Vivien Presto, MD, <Dictator> [place] Candace Cruise, MD Vivien Presto MD ELECTRONICALLY SIGNED 01/26/2012 16:17

## 2014-09-20 NOTE — Consult Note (Signed)
Chief Complaint:   Subjective/Chief Complaint No abd pain but nausea and headache. Lipase back to normal. Just returned from MRCP.   VITAL SIGNS/ANCILLARY NOTES: **Vital Signs.:   22-Aug-13 09:35   Vital Signs Type Q 4hr   Temperature Temperature (F) 98.1   Celsius 36.7   Temperature Source Oral   Pulse Pulse 70   Respirations Respirations 18   Systolic BP Systolic BP 941   Diastolic BP (mmHg) Diastolic BP (mmHg) 89   Mean BP 104   Pulse Ox % Pulse Ox % 99   Pulse Ox Activity Level  At rest   Oxygen Delivery Room Air/ 21 %   Brief Assessment:   Cardiac Regular    Respiratory normal resp effort    Gastrointestinal Normal   Lab Results: Hepatic:  22-Aug-13 05:45    Bilirubin, Total 0.9   Alkaline Phosphatase 91   SGPT (ALT)  140   SGOT (AST)  107   Total Protein, Serum  5.7   Albumin, Serum  2.8  Routine Chem:  22-Aug-13 05:45    Lipase 84 (Result(s) reported on 23 Jan 2012 at 07:34AM.)   Glucose, Serum 75   BUN  5   Creatinine (comp) 0.79   Sodium, Serum 140   Potassium, Serum 4.1   Chloride, Serum  108   CO2, Serum 27   Calcium (Total), Serum  8.3   Osmolality (calc) 275   eGFR (African American) >60   eGFR (Non-African American) >60 (eGFR values <53m/min/1.73 m2 may be an indication of chronic kidney disease (CKD). Calculated eGFR is useful in patients with stable renal function. The eGFR calculation will not be reliable in acutely ill patients when serum creatinine is changing rapidly. It is not useful in  patients on dialysis. The eGFR calculation may not be applicable to patients at the low and high extremes of body sizes, pregnant women, and vegetarians.)   Anion Gap  5   Cholesterol, Serum 114   Triglycerides, Serum 50   HDL (INHOUSE)  39   VLDL Cholesterol Calculated 10   LDL Cholesterol Calculated 65 (Result(s) reported on 23 Jan 2012 at 07:34AM.)  Routine Hem:  22-Aug-13 05:45    WBC (CBC) 5.0   RBC (CBC) 3.83   Hemoglobin (CBC)  11.5    Hematocrit (CBC)  33.7   Platelet Count (CBC) 170   MCV 88   MCH 30.1   MCHC 34.2   RDW 13.6   Neutrophil % 44.2   Lymphocyte % 45.3   Monocyte % 6.5   Eosinophil % 3.1   Basophil % 0.9   Neutrophil # 2.2   Lymphocyte # 2.3   Monocyte # 0.3   Eosinophil # 0.2   Basophil # 0.0 (Result(s) reported on 23 Jan 2012 at 06:48AM.)   Assessment/Plan:  Assessment/Plan:   Assessment Pancreatitis, resolving.    Plan Clear liquid diet ordered. May help with headache. Await MRCP results. Consider ordering HIDA scan tomorrow. Make sure patient gets CCK for HIDA. Thanks.   Electronic Signatures: OVerdie Shire(MD)  (Signed [id number])  Authored: Chief Complaint, VITAL SIGNS/ANCILLARY NOTES, Brief Assessment, Lab Results, Assessment/Plan   Last Updated: 22-Aug-13 13:04 by OVerdie Shire(MD)

## 2014-09-20 NOTE — Consult Note (Signed)
Pt seen and examined. Acute pancreatitis. Feels better now. See C. London's notes. Repeat MRCP. HIDA scan when patient no longer requires pain meds for pancreatitis. Will follow. Thanks.  Electronic Signatures: Verdie Shire (MD)  (Signed on 21-Aug-13 21:35)  Authored  Last Updated: 21-Aug-13 21:35 by Verdie Shire (MD)

## 2014-09-20 NOTE — Consult Note (Signed)
Brief Consult Note: Diagnosis: abdominal pain.   Patient was seen by consultant.   Consult note dictated.   Comments: Appreciate consult for 39 y/o Hispanic woman to evaluate pancreatitis. She has a history of cholecystectomy and rouen-y-bypass and underwent MRCP for RUQ pain in June. Was referred to [facility] for 2nd opinion prior to ERCP, as MRCP was not conclusive for problem.  Patient states that Dr Mont Dutton at [facility] wanted her to have HIDA prior to ERCP, but if pain started again, she should have labwork done. HIDA scheduled for next week. Patient reports onset of epigastric pain that radiated to the back and across the abdomenm onset 0200 today. Accompanied by nausea, no vomiting. Denies further GI symptoms. Feeling better now after pain meds. Lipase 1175 today. No fevers and denies further c/o. Noted elevated AST, patient states she rarely consumes etoh. Abdomen soft, bowel sounds x 4, minimal epigastric tenderness.  Impression and plan: Pancreatitis, recommend correcting electrolytes, hydration, pain control. Will repeat MRCP due to pain and elevated AST to evaluate hepatobiliary tree again. Further recommendations to follow.  Electronic Signatures: Stephens November H (NP)  (Signed 21-Aug-13 17:41)  Authored: Brief Consult Note   Last Updated: 21-Aug-13 17:41 by Theodore Demark (NP)

## 2014-09-20 NOTE — Consult Note (Signed)
PATIENT NAME:  Misty Zamora, Misty Zamora MR#:  [id number] DATE OF BIRTH:  07-Nov-1975  DATE OF CONSULTATION:  01/22/2012  REFERRING PHYSICIAN:  Dr. Pearletha Furl CONSULTING PHYSICIAN:  Theodore Demark, NP  PRIMARY CARE PHYSICIAN: [facility]  HISTORY OF PRESENT ILLNESS:  Misty Zamora is a pleasant 39 year old Hispanic woman admitted with abdominal pain. GI has been consulted at the request of Dr. Pearletha Furl to evaluate pancreatitis and abdominal pain. She has a history of cholecystectomy and Roux-en-Y bypass. She underwent MRCP in June of this year to evaluate right upper quadrant pain. She was referred to [facility] for second opinion prior to progressing to ERCP as MRCP was not conclusive for the problem. The patient states that Dr. Mont Dutton, the GI physician she saw at [facility], wanted her to have HIDA prior to ERCP, but if the pain started again she should have lab work done. HIDA is scheduled for this week.  The patient reports onset of epigastric pain that radiates to the back and across the top of the abdomen, onset 02:00 today, accompanied by nausea but no vomiting. She denies further GI symptoms, feeling better now after pain medications. Lipase 1175 today. No fever and denies further complaints. Noted elevated AST. The patient states she rarely consumes EtOH.  REVIEW OF SYSTEMS:  Ten-point review negative other than what is noted above.   PAST MEDICAL HISTORY: 1. Anxiety. 2. Gastroesophageal reflux disease. However, this has resolved since her Roux-en-Y.  3. Prior history of morbid obesity. 4. History of obstructive sleep apnea. 5. Hypertension. However, this seems to have resolved since her Roux-en-Y. She is not on medication currently. 6. Thyroid nodules.  7. Gastric bypass one year ago. 8. Internal hemorrhoids. 9. Laparoscopic cholecystectomy. 10. Bilateral tubal ligation. 11. Dilation and curettage.   SOCIAL HISTORY: Lives with family. Remote history of smoking, quit greater than 12 years  ago. Rare EtOH. No illicits. 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   FAMILY HISTORY: Significant for diabetes, hypertension, and coronary artery disease.   ALLERGIES: Percocet.  MEDICATIONS:  Multivitamin 1 p.o. daily.   MOST RECENT LABS: Glucose 71, BUN 6, creatinine 0.82, sodium 141, potassium 3.4, chloride 105, GFR greater than 60, calcium 8.7, lipase 1175, total serum protein 7.3, albumin 3.8, total bilirubin 0.5, alkaline phosphatase 77, AST 127, ALT 42. Troponin less than 0.02. WBC 8.1, hemoglobin 12.6, hematocrit 36, platelets 211. Red cells are normocytic. Urinalysis is unremarkable. CT of the abdomen and pelvis with mild distention from the common bile duct, unchanged from prior study. No other acute abnormality.   PHYSICAL EXAMINATION:  VITAL SIGNS: Most recent vital signs: Temperature 98.3, pulse 54, respiratory rate 16, blood pressure 105/64.    GENERAL: Pleasant young woman, in no acute distress.   PSYCHIATRIC: Cooperative, good memory. Thought logical.   HEENT: Atraumatic, normocephalic. No redness, drainage, or inflammation to the eyes or the nares. Oral mucous membranes are pink and moist.   NECK: Supple. No thyromegaly, adenopathy, or JVD.   CHEST: Respirations eupneic. Lungs clear to auscultation and percussion.  CARDIOVASCULAR: S1, S2. Regular rate and rhythm. No murmurs, rubs, or gallops.  Peripheral pulses 2+. No edema.   ABDOMEN: Flat, soft, bowel sounds times four. Mild epigastric tenderness. No rebound, peritoneal signs, guarding, hernias, or hepatosplenomegaly.   GENITOURINARY: Deferred.   RECTAL: Deferred.   EXTREMITIES: No clubbing, deformity, or cyanosis. Moves all extremities well times four.   NEUROLOGICAL: Cranial nerves II through XII grossly intact. Alert and oriented times three. Speech clear. No facial  droop.   IMPRESSION AND PLAN: Pancreatitis, stable. Dilated common bile duct. Do recommend  correcting electrolytes and continue with  hydration and pain control. We will repeat MRCP due to pain, elevated AST, and dilated CBD to evaluate hepatobiliary tree again. Further recommendations to follow.   These services were provided by Stephens November, MSN, [facility] in collaboration with Dr. Verdie Shire, with whom I have discussed this patient in full.    ____________________________ Theodore Demark, NP chl:bjt D: 01/22/2012 19:24:38 ET T: 01/22/2012 20:38:23 ET JOB#: [id number]  cc: Theodore Demark, NP, <Dictator> [place] SIGNED 01/28/2012 16:21

## 2014-09-26 LAB — SURGICAL PATHOLOGY

## 2014-09-28 ENCOUNTER — Other Ambulatory Visit: Payer: Self-pay | Admitting: Bariatrics

## 2014-09-28 DIAGNOSIS — IMO0001 Reserved for inherently not codable concepts without codable children: Secondary | ICD-10-CM

## 2014-09-28 DIAGNOSIS — K219 Gastro-esophageal reflux disease without esophagitis: Principal | ICD-10-CM

## 2014-10-02 NOTE — Op Note (Signed)
PATIENT NAME:  Misty Zamora, Misty Zamora MR#:  [id number] DATE OF BIRTH:  1976-05-18  DATE OF PROCEDURE:  06/21/2014  PREOPERATIVE DIAGNOSIS:  Symptomatic plica left elbow.   POSTOPERATIVE DIAGNOSIS: Synovitis with symptomatic plica left elbow.   PROCEDURE:  Arthroscopic partial synovectomy with excision of posterolateral plica, left elbow.   SURGEON:  Pascal Lux, MD   ASSISTANT:  April Berndt, NP.  ANESTHESIA:  General endotracheal.   FINDINGS: As noted above.   COMPLICATIONS: None.   ESTIMATED BLOOD LOSS: Minimal.   TOTAL FLUIDS: Crystalloid 800 mL.   TOURNIQUET TIME:  47 minutes at 250 mmHg.   DRAINS: None.   CLOSURE: 4-0 Prolene interrupted sutures.  BRIEF CLINICAL NOTE: The patient is a 39 year old female with a 6+ month history of gradually worsening painful catching of her left elbow. Her symptoms have persisted despite medications, activity modification, physical therapy, and a steroid injection. Her history and examination were suspicious for a symptomatic plica. An MRI scan was negative for any other pathology.  She presents at this time for arthroscopy, debridement, and excision of the presumed a symptomatic plica.   PROCEDURE: The patient was brought into the Operating Room and lain in the supine position. After adequate general endotracheal intubation and anesthesia were obtained, she was repositioned in the right lateral decubitus position and secured using a lateral hip positioner. The right arm was placed over the arm holder and secured to the arm holder. The left upper extremity was prepped with ChloraPrep solution before being draped sterilely. Preoperative antibiotics were administered. The limb was exsanguinated with an Esmarch before the tourniquet was inflated to 250 mmHg.  The expected portal sites were carefully marked on the skin before the joint was insufflated with 25 mL of sterile saline solution.  An anterolateral portal site was created and the cannula  introduced.  It was brought through to the far side and, after tenting the skin, an anteromedial portal was created and the trocar advanced through the skin. This was used to guide the disposable cannula into the joint. Visibility was quite difficult due to what appeared to be proliferative synovitis.  It was unclear that we were in the joint, so several additional attempts were made to access the anterior aspect of the joint. These proved unsuccessful, so it was felt best to proceed with accessing the posterior portal and proceeding to where the expected pathology would be.  A posterolateral portal was created and initially used as the viewing portal. A central posterior portal was created and initially used as a working portal. The synovial tissues were debrided from the olecranon fossa before the olecranon tip was exposed using the full radius resector, followed by the ArthroCare wand.  There was no evidence of degenerative changes. The posteromedial gutter was clean with only minimal synovitis. The posterolateral gutter was notable for a thickened plica which was debrided using the full radius resector. In order to complete resection, the camera was repositioned in the central posterior portal and the full radius resector reintroduced through the posterolateral portal.  This was done to enable complete excision of the symptomatic plica.   Attention was redirected to the anterior aspect of the elbow.  Several additional attempts were made to access the anterior aspect of the joint, both from the anterolateral and anteromedial portals.  In addition, a superior anteromedial portal was created and another attempt was made to access the joint through this, but was also unsuccessful.  Therefore, it was felt best to abort the procedure at  this point rather than risk causing injury to the anterior neurovascular structures. The portal sites were closed using 4-0 Prolene interrupted sutures before a sterile bulky  dressing was applied to the elbow.  The patient was rolled back in the supine position before she was awakened, extubated, and returned to the recovery room in satisfactory condition after tolerating the procedure well.  Of note, the patient's left upper extremity was placed into a sling for comfort.      ____________________________ J. Dorien Chihuahua, MD jjp:DT D: 06/21/2014 14:22:21 ET T: 06/21/2014 16:51:22 ET JOB#: [id number]  cc: Pascal Lux, MD, <Dictator> JEFF Robby Sermon MD ELECTRONICALLY SIGNED 06/28/2014 9:06

## 2014-10-02 NOTE — Op Note (Signed)
PATIENT NAME:  Misty Zamora, Misty Zamora MR#:  [id number] DATE OF BIRTH:  1975-07-01  DATE OF PROCEDURE:  08/09/2014  PREOPERATIVE DIAGNOSES:  1.  Abnormal uterine bleeding.  2.  Endometrial polyp.   POSTOPERATIVE DIAGNOSES: 1.  Abnormal uterine bleeding.  2.  Endometrial polyp.   PROCEDURES:  1.  Dilation and curettage.  2.  Hysteroscopy.  3.  Polypectomy.   ANESTHESIA: General.   SURGEON: Prentice Docker, MD   ESTIMATED BLOOD LOSS: 25 mL.   OPERATIVE FLUIDS: Crystalloid 400 mL.   FLUID DEFICIT: As measured by Renato Battles system zero milliliters.   COMPLICATIONS: None.   FINDINGS:  1.  A 1 to 2 cm right anterior broad-based polyp in uterus.  2.  Otherwise normal-appearing uterine cavity.   SPECIMENS:   1.  Endometrial curettings.  2.  Endometrial polyp.   CONDITION AT THE END OF PROCEDURE: Stable.   PROCEDURE IN DETAIL: The patient was taken to the operating room where general anesthesia was administered and found to be adequate. The patient was placed in the dorsal supine high lithotomy position in candy cane stirrups and prepped and draped in the usual sterile fashion. After a timeout was called, in and out catheterization was performed for return of 50 mL of clear urine. A sterile speculum was placed in the vagina and a single-tooth tenaculum was used to grasp the anterior lip of the cervix. The cervix was dilated gently in a serial fashion using Hegar dilators to a dilatation of 6 mm. The MyoSure hysteroscope was passed gently to the cervix and into the uterus with the above-noted findings. The MyoSure light device was attached to the MyoSure hysteroscope and polypectomy was undertaken using the MyoSure device. The MyoSure hysteroscope was removed and a gentle curettage was performed for global sampling throughout. The hysteroscope was returned to the uterus with hemostasis noted at pressures well below the mean arterial pressure inside the uterus with hemostasis. This terminated the  procedure. The hysteroscope was removed and the tenaculum was removed from the anterior lip of the cervix. Silver nitrate was used on one of the insertion sites of the tenaculum to obtain hemostasis. Verification that no instrumentation or sponges were left in the vagina was performed.   The patient tolerated the procedure well. Sponge, lap, and needle counts were correct x2. The patient was wearing pneumatic compression stockings throughout the entire procedure for VTE prophylaxis. She was awakened in the operating room and taken to the recovery area in stable condition.    ____________________________ Will Bonnet, MD sdj:TM D: 08/09/2014 15:57:25 ET T: 08/10/2014 00:41:42 ET JOB#: [id number]  cc: Will Bonnet, MD, <Dictator> Will Bonnet MD ELECTRONICALLY SIGNED 08/18/2014 10:12

## 2014-10-03 ENCOUNTER — Emergency Department
Admission: EM | Admit: 2014-10-03 | Discharge: 2014-10-03 | Disposition: A | Discharge status: Home / Self Care | Payer: 59 | Attending: Emergency Medicine | Admitting: Emergency Medicine

## 2014-10-03 ENCOUNTER — Emergency Department: Payer: 59

## 2014-10-03 ENCOUNTER — Other Ambulatory Visit: Payer: Self-pay

## 2014-10-03 DIAGNOSIS — R1031 Right lower quadrant pain: Secondary | ICD-10-CM | POA: Diagnosis not present

## 2014-10-03 DIAGNOSIS — R109 Unspecified abdominal pain: Secondary | ICD-10-CM

## 2014-10-03 DIAGNOSIS — Z87891 Personal history of nicotine dependence: Secondary | ICD-10-CM | POA: Insufficient documentation

## 2014-10-03 DIAGNOSIS — Z9851 Tubal ligation status: Secondary | ICD-10-CM | POA: Insufficient documentation

## 2014-10-03 DIAGNOSIS — Z9884 Bariatric surgery status: Secondary | ICD-10-CM | POA: Diagnosis not present

## 2014-10-03 DIAGNOSIS — Z9889 Other specified postprocedural states: Secondary | ICD-10-CM | POA: Diagnosis not present

## 2014-10-03 LAB — URINALYSIS COMPLETE WITH MICROSCOPIC (ARMC ONLY)
BILIRUBIN URINE: NEGATIVE
Bacteria, UA: NONE SEEN
Glucose, UA: NEGATIVE mg/dL
Hgb urine dipstick: NEGATIVE
KETONES UR: NEGATIVE mg/dL
Nitrite: NEGATIVE
Protein, ur: NEGATIVE mg/dL
SPECIFIC GRAVITY, URINE: 1.015 (ref 1.005–1.030)
pH: 6 (ref 5.0–8.0)

## 2014-10-03 LAB — COMPREHENSIVE METABOLIC PANEL
ALT: 15 U/L (ref 14–54)
ANION GAP: 4 — AB (ref 5–15)
AST: 36 U/L (ref 15–41)
Albumin: 3.4 g/dL — ABNORMAL LOW (ref 3.5–5.0)
Alkaline Phosphatase: 54 U/L (ref 38–126)
BILIRUBIN TOTAL: 0.2 mg/dL — AB (ref 0.3–1.2)
BUN: <5 mg/dL — ABNORMAL LOW (ref 6–20)
CO2: 29 mmol/L (ref 22–32)
Calcium: 8.8 mg/dL — ABNORMAL LOW (ref 8.9–10.3)
Chloride: 106 mmol/L (ref 101–111)
Creatinine, Ser: 0.64 mg/dL (ref 0.44–1.00)
Glucose, Bld: 85 mg/dL (ref 65–99)
Potassium: 4.1 mmol/L (ref 3.5–5.1)
Sodium: 139 mmol/L (ref 135–145)
Total Protein: 6.6 g/dL (ref 6.5–8.1)

## 2014-10-03 LAB — CBC WITH DIFFERENTIAL/PLATELET
Basophils Absolute: 0.1 10*3/uL (ref 0–0.1)
EOS ABS: 0.1 10*3/uL (ref 0–0.7)
HCT: 31.3 % — ABNORMAL LOW (ref 35.0–47.0)
Hemoglobin: 9.9 g/dL — ABNORMAL LOW (ref 12.0–16.0)
Lymphocytes Relative: 31 %
Lymphs Abs: 1.9 10*3/uL (ref 1.0–3.6)
MCH: 22.5 pg — ABNORMAL LOW (ref 26.0–34.0)
MCHC: 31.6 g/dL — ABNORMAL LOW (ref 32.0–36.0)
MCV: 71.1 fL — ABNORMAL LOW (ref 80.0–100.0)
Monocytes Absolute: 0.4 10*3/uL (ref 0.2–0.9)
Monocytes Relative: 7 %
Neutro Abs: 3.5 10*3/uL (ref 1.4–6.5)
PLATELETS: 252 10*3/uL (ref 150–440)
RBC: 4.4 MIL/uL (ref 3.80–5.20)
RDW: 20.3 % — ABNORMAL HIGH (ref 11.5–14.5)
WBC: 6 10*3/uL (ref 3.6–11.0)

## 2014-10-03 LAB — LIPASE, BLOOD: Lipase: 28 U/L (ref 22–51)

## 2014-10-03 MED ORDER — MORPHINE SULFATE 4 MG/ML IJ SOLN
INTRAMUSCULAR | Status: AC
  Filled 2014-10-03: qty 1

## 2014-10-03 MED ORDER — OXYCODONE-ACETAMINOPHEN 5-325 MG PO TABS: 1.0000 | ORAL_TABLET | Freq: Four times a day (QID) | ORAL | Status: DC | PRN

## 2014-10-03 MED ORDER — HYDROMORPHONE HCL 1 MG/ML IJ SOLN
INTRAMUSCULAR | Status: AC
  Administered 2014-10-03: 1 mg via INTRAVENOUS
  Filled 2014-10-03: qty 1

## 2014-10-03 MED ORDER — ONDANSETRON HCL 4 MG/2ML IJ SOLN
INTRAMUSCULAR | Status: AC
  Filled 2014-10-03: qty 2

## 2014-10-03 MED ORDER — FAMOTIDINE IN NACL 20-0.9 MG/50ML-% IV SOLN
20.0000 mg | Freq: Once | INTRAVENOUS | Status: AC
  Administered 2014-10-03: 20 mg via INTRAVENOUS

## 2014-10-03 MED ORDER — MORPHINE SULFATE 4 MG/ML IJ SOLN
4.0000 mg | Freq: Once | INTRAMUSCULAR | Status: AC
  Administered 2014-10-03: 4 mg via INTRAVENOUS

## 2014-10-03 MED ORDER — ONDANSETRON HCL 4 MG/2ML IJ SOLN
4.0000 mg | Freq: Once | INTRAMUSCULAR | Status: AC
  Administered 2014-10-03: 4 mg via INTRAVENOUS

## 2014-10-03 MED ORDER — ONDANSETRON HCL 4 MG/2ML IJ SOLN: 4.0000 mg | Freq: Once | INTRAMUSCULAR | Status: DC

## 2014-10-03 MED ORDER — SODIUM CHLORIDE 0.9 % IV BOLUS (SEPSIS)
1000.0000 mL | Freq: Once | INTRAVENOUS | Status: AC
  Administered 2014-10-03: 1000 mL via INTRAVENOUS

## 2014-10-03 MED ORDER — MORPHINE SULFATE 4 MG/ML IJ SOLN: 4.0000 mg | Freq: Once | INTRAMUSCULAR | Status: DC

## 2014-10-03 MED ORDER — FAMOTIDINE IN NACL 20-0.9 MG/50ML-% IV SOLN
INTRAVENOUS | Status: AC
  Filled 2014-10-03: qty 50

## 2014-10-03 MED ORDER — ONDANSETRON HCL 4 MG PO TABS: 4.0000 mg | ORAL_TABLET | Freq: Four times a day (QID) | ORAL | Status: DC | PRN

## 2014-10-03 MED ORDER — HYDROMORPHONE HCL 1 MG/ML IJ SOLN
1.0000 mg | INTRAMUSCULAR | Status: DC | PRN
  Administered 2014-10-03 (×2): 1 mg via INTRAVENOUS

## 2014-10-03 MED ORDER — PROMETHAZINE HCL 25 MG/ML IJ SOLN
INTRAMUSCULAR | Status: AC
  Administered 2014-10-03: 10:00:00 via INTRAVENOUS
  Filled 2014-10-03: qty 1

## 2014-10-03 MED ORDER — HYDROMORPHONE HCL 1 MG/ML IJ SOLN
INTRAMUSCULAR | Status: AC
  Filled 2014-10-03: qty 1

## 2014-10-03 NOTE — Discharge Instructions (Signed)
Abdominal Pain, Women °Abdominal (stomach, pelvic, or belly) pain can be caused by many things. It is important to tell your doctor: °· The location of the pain. °· Does it come and go or is it present all the time? °· Are there things that start the pain (eating certain foods, exercise)? °· Are there other symptoms associated with the pain (fever, nausea, vomiting, diarrhea)? °All of this is helpful to know when trying to find the cause of the pain. °CAUSES  °· Stomach: virus or bacteria infection, or ulcer. °· Intestine: appendicitis (inflamed appendix), regional ileitis (Crohn's disease), ulcerative colitis (inflamed colon), irritable bowel syndrome, diverticulitis (inflamed diverticulum of the colon), or cancer of the stomach or intestine. °· Gallbladder disease or stones in the gallbladder. °· Kidney disease, kidney stones, or infection. °· Pancreas infection or cancer. °· Fibromyalgia (pain disorder). °· Diseases of the female organs: °¨ Uterus: fibroid (non-cancerous) tumors or infection. °¨ Fallopian tubes: infection or tubal pregnancy. °¨ Ovary: cysts or tumors. °¨ Pelvic adhesions (scar tissue). °¨ Endometriosis (uterus lining tissue growing in the pelvis and on the pelvic organs). °¨ Pelvic congestion syndrome (female organs filling up with blood just before the menstrual period). °¨ Pain with the menstrual period. °¨ Pain with ovulation (producing an egg). °¨ Pain with an IUD (intrauterine device, birth control) in the uterus. °¨ Cancer of the female organs. °· Functional pain (pain not caused by a disease, may improve without treatment). °· Psychological pain. °· Depression. °DIAGNOSIS  °Your doctor will decide the seriousness of your pain by doing an examination. °· Blood tests. °· X-rays. °· Ultrasound. °· CT scan (computed tomography, special type of X-ray). °· MRI (magnetic resonance imaging). °· Cultures, for infection. °· Barium enema (dye inserted in the large intestine, to better view it with  X-rays). °· Colonoscopy (looking in intestine with a lighted tube). °· Laparoscopy (minor surgery, looking in abdomen with a lighted tube). °· Major abdominal exploratory surgery (looking in abdomen with a large incision). °TREATMENT  °The treatment will depend on the cause of the pain.  °· Many cases can be observed and treated at home. °· Over-the-counter medicines recommended by your caregiver. °· Prescription medicine. °· Antibiotics, for infection. °· Birth control pills, for painful periods or for ovulation pain. °· Hormone treatment, for endometriosis. °· Nerve blocking injections. °· Physical therapy. °· Antidepressants. °· Counseling with a psychologist or psychiatrist. °· Minor or major surgery. °HOME CARE INSTRUCTIONS  °· Do not take laxatives, unless directed by your caregiver. °· Take over-the-counter pain medicine only if ordered by your caregiver. Do not take aspirin because it can cause an upset stomach or bleeding. °· Try a clear liquid diet (broth or water) as ordered by your caregiver. Slowly move to a bland diet, as tolerated, if the pain is related to the stomach or intestine. °· Have a thermometer and take your temperature several times a day, and record it. °· Bed rest and sleep, if it helps the pain. °· Avoid sexual intercourse, if it causes pain. °· Avoid stressful situations. °· Keep your follow-up appointments and tests, as your caregiver orders. °· If the pain does not go away with medicine or surgery, you may try: °¨ Acupuncture. °¨ Relaxation exercises (yoga, meditation). °¨ Group therapy. °¨ Counseling. °SEEK MEDICAL CARE IF:  °· You notice certain foods cause stomach pain. °· Your home care treatment is not helping your pain. °· You need stronger pain medicine. °· You want your IUD removed. °· You feel faint or   lightheaded. °· You develop nausea and vomiting. °· You develop a rash. °· You are having side effects or an allergy to your medicine. °SEEK IMMEDIATE MEDICAL CARE IF:  °· Your  pain does not go away or gets worse. °· You have a fever. °· Your pain is felt only in portions of the abdomen. The right side could possibly be appendicitis. The left lower portion of the abdomen could be colitis or diverticulitis. °· You are passing blood in your stools (bright red or black tarry stools, with or without vomiting). °· You have blood in your urine. °· You develop chills, with or without a fever. °· You pass out. °MAKE SURE YOU:  °· Understand these instructions. °· Will watch your condition. °· Will get help right away if you are not doing well or get worse. °Document Released: 03/17/2007 Document Revised: 10/04/2013 Document Reviewed: 04/06/2009 °[employer]® Patient Information ©2015 [employer]. This information is not intended to replace advice given to you by your health care provider. Make sure you discuss any questions you have with your health care provider. ° °

## 2014-10-03 NOTE — ED Provider Notes (Signed)
-----------------------------------------   2:35 PM on 10/03/2014 -----------------------------------------  I assumed care of the patient is 7:00 AM, with labs and a CT scan pending. These results and will come back and are unremarkable. I discussed the results with the patient, who was concerned due to her history of ureteral stones causing obstructions in the past. I discussed the case with Dr. Erlene Quan of [facility] urological, with whom the patient has recently established care. The patient does note that she is scheduled to have a cystoscopy in about 1 week with them. At this time and no other interventions or testing are needed, and the patient is stable for follow-up. She did have some continued nausea, but this was managed effectively with Zofran and Phenergan as needed.  Clinical impression abdominal pain, nonspecific.  Carrie Mew, MD 10/03/14 [phone]

## 2014-10-03 NOTE — ED Notes (Signed)
Pt complaining of general weakness and nausea; BP reassessed (124/70); Nurse notified

## 2014-10-03 NOTE — ED Notes (Signed)
Pt reports increased nausea, with decreased pain following administration of pain and antiemtic meds.

## 2014-10-03 NOTE — ED Provider Notes (Signed)
[facility] Emergency Department Provider Note    ____________________________________________  Time seen: 0615  I have reviewed the triage vital signs and the nursing notes.   HISTORY  Chief Complaint Flank Pain       HPI Misty Zamora is a 39 y.o. female who presents with right flank and right lower quadrant pain 1 day. Past medical history significant for obstructive kidney stones. Describes pelvic pain 1 month; recent negative evaluation at urologist.Complains of 10/10 sharp, stabbing pain in right lower quadrant and flank associated with nausea. Symptoms associated with urinary hesitancy and pressure. Denies vomiting, fever, chills, diarrhea. Taking OTC medicines without relief of symptoms. Denies vaginal bleeding or discharge.     Past Medical History  Diagnosis Date  . Heart murmur 2005  . History of cardiovascular stress test 2005    showed MR and TR    Patient Active Problem List   Diagnosis Date Noted  . Dizziness 03/25/2011  . Palpitations 03/25/2011  . DYSPNEA 12/29/2009  . CHEST PAIN UNSPECIFIED 12/29/2009    Past Surgical History  Procedure Laterality Date  . Gallbladder removed    . Laproscopy    . Colonoscopy  2010  . Renal endoscopy via nephrostomy / pyelostomy  2010  . Tubal ligation    . Gastric bypass  2012    No current outpatient prescriptions on file.  Allergies Review of patient's allergies indicates no known allergies.  Family History  Problem Relation Age of Onset  . Hypertension Mother   . Hyperlipidemia Mother   . Hyperlipidemia Father   . Hypertension Father   . Hypertension Sister   . Hypertension Brother     Social History History  Substance Use Topics  . Smoking status: Former Smoker -- 1.00 packs/day for 8 years    Types: Cigarettes    Quit date: 06/04/1999  . Smokeless tobacco: Not on file  . Alcohol Use: Yes    Review of Systems  Constitutional: Negative for fever. Eyes:  Negative for visual changes. ENT: Negative for sore throat. Cardiovascular: Negative for chest pain. Respiratory: Negative for shortness of breath. Gastrointestinal: Positive for abdominal pain and nausea. Negative for vomiting and diarrhea. Genitourinary: Positive for dysuria, pressure, and hesitancy. Musculoskeletal: Negative for back pain. Skin: Negative for rash. Neurological: Negative for headaches, focal weakness or numbness.   10-point ROS otherwise negative.  ____________________________________________   PHYSICAL EXAM:  VITAL SIGNS: ED Triage Vitals  Enc Vitals Group     BP 10/03/14 0534 131/84 mmHg     Pulse Rate 10/03/14 0534 92     Resp 10/03/14 0534 20     Temp 10/03/14 0534 98.5 F (36.9 C)     Temp src --      SpO2 10/03/14 0534 100 %     Weight 10/03/14 0534 195 lb (88.451 kg)     Height 10/03/14 0534 5\' 9"  (1.753 m)     Head Cir --      Peak Flow --      Pain Score 10/03/14 0536 8     Pain Loc --      Pain Edu? --      Excl. in [place]? --      Constitutional: Alert and oriented. Tearful in mild distress. Eyes: Conjunctivae are normal. PERRL. Normal extraocular movements. ENT   Head: Normocephalic and atraumatic.   Nose: No congestion/rhinnorhea.   Mouth/Throat: Mucous membranes are moist.   Neck: No stridor. Hematological/Lymphatic/Immunilogical: No cervical lymphadenopathy. Cardiovascular: Normal rate, regular rhythm.  Normal and symmetric distal pulses are present in all extremities. No murmurs, rubs, or gallops. Respiratory: Normal respiratory effort without tachypnea nor retractions. Breath sounds are clear and equal bilaterally. No wheezes/rales/rhonchi. Gastrointestinal: Soft and tender to right lower quadrant. No distention. No abdominal bruits. Mild right CVA tenderness.  Genitourinary: Deferred Musculoskeletal: Nontender with normal range of motion in all extremities. No joint effusions.  No lower extremity tenderness nor  edema. Neurologic:  Normal speech and language. No gross focal neurologic deficits are appreciated. Speech is normal. No gait instability. Skin:  Skin is warm, dry and intact. No rash noted. Psychiatric: Mood and affect are normal. Speech and behavior are normal. Patient exhibits appropriate insight and judgment.  ____________________________________________   EKG  ED ECG REPORT   Date: 10/03/2014  EKG Time: 0646  Rate: 81  Rhythm: normal EKG, normal sinus rhythm  Axis: Normal  Intervals:none  ST&T Change: Nonspecific   ____________________________________________    RADIOLOGY  CT abdomen/pelvis pending.  ____________________________________________   PROCEDURES  Procedure(s) performed: None  Critical Care performed: No  ____________________________________________   INITIAL IMPRESSION / ASSESSMENT AND PLAN / ED COURSE  Pertinent labs & imaging results that were available during my care of the patient were reviewed by me and considered in my medical decision making (see chart for details).  39 year old female with a history of obstructive nephrolithiasis presents with right abdominal and flank pain. Will administer IV fluids, IV analgesics, IV anti-emetics; discussed with patient and will proceed with CT abdomen/pelvis to evaluate kidney stones. Will reassess.  ----------------------------------------- 6:56 AM on 10/03/2014 -----------------------------------------  Patient had epigastric pain after administration of Dilaudid and morphine. EKG obtained which was normal. Added lipase; added IV famotidine.  ----------------------------------------- 7:10 AM on 10/03/2014 -----------------------------------------  Patient appears more comfortable. Care transferred to Dr. Joni Fears. Disposition pending results of labs and CT scan.   ____________________________________________   FINAL CLINICAL IMPRESSION(S) / ED DIAGNOSES  Final diagnoses:  Abdominal pain  in female patient  Flank pain     Paulette Blanch, MD 10/03/14 [phone]

## 2014-10-04 ENCOUNTER — Ambulatory Visit
Admission: RE | Admit: 2014-10-04 | Discharge: 2014-10-04 | Disposition: A | Discharge status: Home / Self Care | Payer: 59 | Source: Ambulatory Visit | Attending: Bariatrics | Admitting: Bariatrics

## 2014-10-04 DIAGNOSIS — K219 Gastro-esophageal reflux disease without esophagitis: Secondary | ICD-10-CM | POA: Diagnosis not present

## 2014-10-04 DIAGNOSIS — IMO0001 Reserved for inherently not codable concepts without codable children: Secondary | ICD-10-CM

## 2014-10-11 LAB — URINE CULTURE: Culture: 4000

## 2014-10-26 ENCOUNTER — Other Ambulatory Visit
Admission: RE | Admit: 2014-10-26 | Discharge: 2014-10-26 | Disposition: A | Discharge status: Home / Self Care | Payer: 59 | Source: Ambulatory Visit | Attending: Bariatrics | Admitting: Bariatrics

## 2014-10-26 LAB — PREALBUMIN: Prealbumin: 18.2 mg/dL (ref 18–38)

## 2014-10-26 LAB — FERRITIN: Ferritin: 3 ng/mL — ABNORMAL LOW (ref 11–307)

## 2014-10-26 LAB — COMPREHENSIVE METABOLIC PANEL
ALK PHOS: 53 U/L (ref 38–126)
ALT: 13 U/L — AB (ref 14–54)
AST: 20 U/L (ref 15–41)
Albumin: 4.1 g/dL (ref 3.5–5.0)
Anion gap: 6 (ref 5–15)
BILIRUBIN TOTAL: 0.3 mg/dL (ref 0.3–1.2)
BUN: 6 mg/dL (ref 6–20)
CO2: 30 mmol/L (ref 22–32)
CREATININE: 0.69 mg/dL (ref 0.44–1.00)
Calcium: 9 mg/dL (ref 8.9–10.3)
Chloride: 104 mmol/L (ref 101–111)
GFR calc Af Amer: >60 mL/min (ref >60)
Glucose, Bld: 84 mg/dL (ref 65–99)
Potassium: 3.8 mmol/L (ref 3.5–5.1)
SODIUM: 140 mmol/L (ref 135–145)
Total Protein: 7.4 g/dL (ref 6.5–8.1)

## 2014-10-26 LAB — MAGNESIUM: MAGNESIUM: 1.8 mg/dL (ref 1.7–2.4)

## 2014-10-26 LAB — CBC WITH DIFFERENTIAL/PLATELET
BASOS ABS: 0 10*3/uL (ref 0–0.1)
Basophils Relative: 1 %
Eosinophils Absolute: 0.1 10*3/uL (ref 0–0.7)
Eosinophils Relative: 1 %
HEMATOCRIT: 33.3 % — AB (ref 35.0–47.0)
Hemoglobin: 10.5 g/dL — ABNORMAL LOW (ref 12.0–16.0)
LYMPHS PCT: 39 %
Lymphs Abs: 2 10*3/uL (ref 1.0–3.6)
MCH: 22.2 pg — ABNORMAL LOW (ref 26.0–34.0)
MCHC: 31.6 g/dL — ABNORMAL LOW (ref 32.0–36.0)
MCV: 70.4 fL — AB (ref 80.0–100.0)
Monocytes Absolute: 0.2 10*3/uL (ref 0.2–0.9)
Monocytes Relative: 5 %
NEUTROS ABS: 2.8 10*3/uL (ref 1.4–6.5)
NEUTROS PCT: 54 %
PLATELETS: 296 10*3/uL (ref 150–440)
RBC: 4.73 MIL/uL (ref 3.80–5.20)
RDW: 18.1 % — AB (ref 11.5–14.5)
WBC: 5.1 10*3/uL (ref 3.6–11.0)

## 2014-10-26 LAB — IRON AND TIBC
IRON: 23 ug/dL — AB (ref 28–170)
SATURATION RATIOS: 4 % — AB (ref 10.4–31.8)
TIBC: 595 ug/dL — AB (ref 250–450)
UIBC: 572 ug/dL

## 2014-10-26 LAB — PHOSPHORUS: PHOSPHORUS: 3.5 mg/dL (ref 2.5–4.6)

## 2014-10-26 LAB — TSH: TSH: 0.756 u[IU]/mL (ref 0.350–4.500)

## 2014-10-26 LAB — AMYLASE: Amylase: 75 U/L (ref 28–100)

## 2014-10-26 LAB — VITAMIN B12: VITAMIN B 12: 406 pg/mL (ref 180–914)

## 2014-10-26 LAB — CORTISOL: CORTISOL PLASMA: 1.9 ug/dL

## 2014-10-26 LAB — PROTIME-INR
INR: 0.97
PROTHROMBIN TIME: 13.1 s (ref 11.4–15.0)

## 2014-10-26 LAB — FOLATE: Folate: 7.6 ng/mL (ref >5.9)

## 2014-10-27 LAB — HEMOGLOBIN A1C: Hgb A1c MFr Bld: 5.1 % (ref 4.0–6.0)

## 2014-10-28 LAB — MISC LABCORP TEST (SEND OUT)
LABCORP TEST CODE: 121200
LabCorp test name: 121200

## 2014-10-28 LAB — VITAMIN A: VITAMIN A (RETINOIC ACID): 26 ug/dL (ref 20–65)

## 2014-10-29 LAB — H PYLORI, IGM, IGG, IGA AB
H. Pylogi, Iga Abs: <9 units (ref 0.0–8.9)
H. Pylogi, Igm Abs: <9 units (ref 0.0–8.9)

## 2014-10-29 LAB — MISC LABCORP TEST (SEND OUT): Labcorp test code: 81000

## 2014-10-29 LAB — ZINC: Zinc: 73 ug/dL (ref 56–134)

## 2014-10-29 LAB — VITAMIN D 1,25 DIHYDROXY
VITAMIN D3 1, 25 (OH): 111 pg/mL
Vitamin D 1, 25 (OH)2 Total: 111 pg/mL
Vitamin D2 1, 25 (OH)2: <10 pg/mL

## 2014-10-29 LAB — COPPER, SERUM: Copper: 128 ug/dL (ref 72–166)

## 2014-10-29 LAB — C-PEPTIDE: C PEPTIDE: 1.5 ng/mL (ref 1.1–4.4)

## 2014-10-29 LAB — VITAMIN B1: VITAMIN B1 (THIAMINE): 115.9 nmol/L (ref 66.5–200.0)

## 2014-10-30 LAB — INSULIN ANTIBODIES, BLOOD

## 2014-11-04 ENCOUNTER — Encounter (HOSPITAL_COMMUNITY): Payer: Self-pay | Admitting: Emergency Medicine

## 2014-11-04 ENCOUNTER — Emergency Department (HOSPITAL_COMMUNITY)
Admission: EM | Admit: 2014-11-04 | Discharge: 2014-11-04 | Discharge status: Against Medical Advice | Payer: 59 | Attending: Emergency Medicine | Admitting: Emergency Medicine

## 2014-11-04 DIAGNOSIS — R109 Unspecified abdominal pain: Secondary | ICD-10-CM | POA: Diagnosis not present

## 2014-11-04 DIAGNOSIS — R509 Fever, unspecified: Secondary | ICD-10-CM | POA: Diagnosis not present

## 2014-11-04 DIAGNOSIS — R110 Nausea: Secondary | ICD-10-CM | POA: Insufficient documentation

## 2014-11-04 LAB — COMPREHENSIVE METABOLIC PANEL
ALBUMIN: 3.4 g/dL — AB (ref 3.5–5.0)
ALK PHOS: 48 U/L (ref 38–126)
ALT: 11 U/L — ABNORMAL LOW (ref 14–54)
AST: 16 U/L (ref 15–41)
Anion gap: 7 (ref 5–15)
CHLORIDE: 105 mmol/L (ref 101–111)
CO2: 27 mmol/L (ref 22–32)
Calcium: 8.8 mg/dL — ABNORMAL LOW (ref 8.9–10.3)
Creatinine, Ser: 0.62 mg/dL (ref 0.44–1.00)
GFR calc Af Amer: >60 mL/min (ref >60)
Glucose, Bld: 97 mg/dL (ref 65–99)
POTASSIUM: 3.8 mmol/L (ref 3.5–5.1)
SODIUM: 139 mmol/L (ref 135–145)
TOTAL PROTEIN: 6.3 g/dL — AB (ref 6.5–8.1)
Total Bilirubin: 0.3 mg/dL (ref 0.3–1.2)

## 2014-11-04 LAB — CBC WITH DIFFERENTIAL/PLATELET
BASOS ABS: 0.1 10*3/uL (ref 0.0–0.1)
BASOS PCT: 1 % (ref 0–1)
EOS ABS: 0.1 10*3/uL (ref 0.0–0.7)
Eosinophils Relative: 2 % (ref 0–5)
HEMATOCRIT: 30.2 % — AB (ref 36.0–46.0)
HEMOGLOBIN: 9.5 g/dL — AB (ref 12.0–15.0)
LYMPHS PCT: 42 % (ref 12–46)
Lymphs Abs: 2.7 10*3/uL (ref 0.7–4.0)
MCH: 22.8 pg — ABNORMAL LOW (ref 26.0–34.0)
MCHC: 31.5 g/dL (ref 30.0–36.0)
MCV: 72.4 fL — AB (ref 78.0–100.0)
Monocytes Absolute: 0.3 10*3/uL (ref 0.1–1.0)
Monocytes Relative: 5 % (ref 3–12)
Neutro Abs: 3.3 10*3/uL (ref 1.7–7.7)
Neutrophils Relative %: 50 % (ref 43–77)
Platelets: 245 10*3/uL (ref 150–400)
RBC: 4.17 MIL/uL (ref 3.87–5.11)
RDW: 16.5 % — AB (ref 11.5–15.5)
WBC: 6.5 10*3/uL (ref 4.0–10.5)

## 2014-11-04 LAB — URINALYSIS, ROUTINE W REFLEX MICROSCOPIC
BILIRUBIN URINE: NEGATIVE
GLUCOSE, UA: NEGATIVE mg/dL
Hgb urine dipstick: NEGATIVE
Ketones, ur: NEGATIVE mg/dL
Leukocytes, UA: NEGATIVE
NITRITE: NEGATIVE
PROTEIN: NEGATIVE mg/dL
SPECIFIC GRAVITY, URINE: 1.004 — AB (ref 1.005–1.030)
Urobilinogen, UA: 1 mg/dL (ref 0.0–1.0)
pH: 7 (ref 5.0–8.0)

## 2014-11-04 LAB — POC URINE PREG, ED: PREG TEST UR: NEGATIVE

## 2014-11-04 NOTE — ED Notes (Signed)
Pt called out from room to be unhooked from monitor. Pt requesting to leave AMA; had unhooked self and got dressed. Informed pt EDP would be in shortly. Pt stating "I've been sitting here too long in pain." Pt ambulatory without difficulty. NAD noted

## 2014-11-04 NOTE — ED Notes (Signed)
This nurse walked by the patient room found gown on the bedside. BP cuff and Oxgen probe on the floor. Patient and family member where out of room. MD notfied.

## 2014-11-04 NOTE — ED Notes (Signed)
Pt. reports left flank/left back pain onset today with fever , chills and nausea . Denies hematuria / no dysuria .

## 2014-11-23 ENCOUNTER — Ambulatory Visit: Payer: 59 | Admitting: Internal Medicine

## 2014-12-01 ENCOUNTER — Encounter: Payer: Self-pay | Admitting: Internal Medicine

## 2014-12-01 ENCOUNTER — Ambulatory Visit: Payer: 59

## 2014-12-01 ENCOUNTER — Encounter: Payer: Self-pay | Admitting: Emergency Medicine

## 2014-12-01 ENCOUNTER — Ambulatory Visit
Admission: EM | Admit: 2014-12-01 | Discharge: 2014-12-01 | Disposition: A | Discharge status: Home / Self Care | Payer: 59 | Attending: Family Medicine | Admitting: Family Medicine

## 2014-12-01 ENCOUNTER — Inpatient Hospital Stay: Payer: 59 | Attending: Internal Medicine | Admitting: Internal Medicine

## 2014-12-01 VITALS — BP 119/76 | HR 87 | Temp 98.6°F | Ht 69.69 in | Wt 198.4 lb

## 2014-12-01 DIAGNOSIS — D509 Iron deficiency anemia, unspecified: Secondary | ICD-10-CM | POA: Insufficient documentation

## 2014-12-01 DIAGNOSIS — R1084 Generalized abdominal pain: Secondary | ICD-10-CM | POA: Diagnosis not present

## 2014-12-01 DIAGNOSIS — D649 Anemia, unspecified: Secondary | ICD-10-CM | POA: Diagnosis not present

## 2014-12-01 DIAGNOSIS — Z9884 Bariatric surgery status: Secondary | ICD-10-CM | POA: Diagnosis not present

## 2014-12-01 DIAGNOSIS — R110 Nausea: Secondary | ICD-10-CM | POA: Diagnosis present

## 2014-12-01 DIAGNOSIS — R011 Cardiac murmur, unspecified: Secondary | ICD-10-CM | POA: Insufficient documentation

## 2014-12-01 DIAGNOSIS — R109 Unspecified abdominal pain: Secondary | ICD-10-CM | POA: Insufficient documentation

## 2014-12-01 DIAGNOSIS — Z79899 Other long term (current) drug therapy: Secondary | ICD-10-CM | POA: Diagnosis not present

## 2014-12-01 LAB — FERRITIN: Ferritin: 6 ng/mL — ABNORMAL LOW (ref 11–307)

## 2014-12-01 LAB — URINALYSIS COMPLETE WITH MICROSCOPIC (ARMC ONLY)
Bacteria, UA: NONE SEEN — AB
Bilirubin Urine: NEGATIVE
Glucose, UA: NEGATIVE mg/dL
Hgb urine dipstick: NEGATIVE
Ketones, ur: NEGATIVE mg/dL
Leukocytes, UA: NEGATIVE
Nitrite: NEGATIVE
PROTEIN: NEGATIVE mg/dL
RBC / HPF: NONE SEEN RBC/hpf (ref <3)
SPECIFIC GRAVITY, URINE: 1.02 (ref 1.005–1.030)
pH: 7.5 (ref 5.0–8.0)

## 2014-12-01 LAB — COMPREHENSIVE METABOLIC PANEL
ALT: 13 U/L — ABNORMAL LOW (ref 14–54)
AST: 17 U/L (ref 15–41)
Albumin: 3.9 g/dL (ref 3.5–5.0)
Alkaline Phosphatase: 57 U/L (ref 38–126)
Anion gap: 7 (ref 5–15)
BUN: 7 mg/dL (ref 6–20)
CO2: 29 mmol/L (ref 22–32)
Calcium: 8.9 mg/dL (ref 8.9–10.3)
Chloride: 99 mmol/L — ABNORMAL LOW (ref 101–111)
Creatinine, Ser: 0.59 mg/dL (ref 0.44–1.00)
GFR calc Af Amer: >60 mL/min (ref >60)
GFR calc non Af Amer: >60 mL/min (ref >60)
GLUCOSE: 86 mg/dL (ref 65–99)
Potassium: 4.3 mmol/L (ref 3.5–5.1)
Sodium: 135 mmol/L (ref 135–145)
Total Bilirubin: 0.3 mg/dL (ref 0.3–1.2)
Total Protein: 7.4 g/dL (ref 6.5–8.1)

## 2014-12-01 LAB — PREGNANCY, URINE: Preg Test, Ur: NEGATIVE

## 2014-12-01 LAB — LIPASE, BLOOD: Lipase: 15 U/L — ABNORMAL LOW (ref 22–51)

## 2014-12-01 LAB — CBC WITH DIFFERENTIAL/PLATELET
Basophils Absolute: 0 10*3/uL (ref 0–0.1)
Basophils Relative: 1 %
EOS ABS: 0.1 10*3/uL (ref 0–0.7)
Eosinophils Relative: 1 %
HEMATOCRIT: 31.9 % — AB (ref 35.0–47.0)
Hemoglobin: 10.2 g/dL — ABNORMAL LOW (ref 12.0–16.0)
LYMPHS PCT: 18 %
Lymphs Abs: 1.6 10*3/uL (ref 1.0–3.6)
MCH: 22.4 pg — AB (ref 26.0–34.0)
MCHC: 32 g/dL (ref 32.0–36.0)
MCV: 70.1 fL — AB (ref 80.0–100.0)
Monocytes Absolute: 0.5 10*3/uL (ref 0.2–0.9)
Monocytes Relative: 6 %
NEUTROS PCT: 74 %
Neutro Abs: 6.7 10*3/uL — ABNORMAL HIGH (ref 1.4–6.5)
Platelets: 252 10*3/uL (ref 150–440)
RBC: 4.56 MIL/uL (ref 3.80–5.20)
RDW: 17.1 % — ABNORMAL HIGH (ref 11.5–14.5)
WBC: 9 10*3/uL (ref 3.6–11.0)

## 2014-12-01 LAB — IRON AND TIBC
IRON: 26 ug/dL — AB (ref 28–170)
Saturation Ratios: 5 % — ABNORMAL LOW (ref 10.4–31.8)
TIBC: 518 ug/dL — ABNORMAL HIGH (ref 250–450)
UIBC: 492 ug/dL

## 2014-12-01 MED ORDER — ONDANSETRON 4 MG PO TBDP
4.0000 mg | ORAL_TABLET | Freq: Once | ORAL | Status: AC
  Administered 2014-12-01: 4 mg via ORAL

## 2014-12-01 MED ORDER — DICYCLOMINE HCL 20 MG PO TABS: 20.0000 mg | ORAL_TABLET | Freq: Three times a day (TID) | ORAL | Status: DC

## 2014-12-01 MED ORDER — POLYETHYLENE GLYCOL 3350 17 G PO PACK: 17.0000 g | PACK | Freq: Every day | ORAL | Status: DC

## 2014-12-01 MED ORDER — SIMETHICONE 80 MG PO CHEW: 80.0000 mg | CHEWABLE_TABLET | Freq: Four times a day (QID) | ORAL | Status: DC | PRN

## 2014-12-01 NOTE — ED Provider Notes (Signed)
CSN: [id number]     Arrival date & time 12/01/14  1201 History   First MD Initiated Contact with Patient 12/01/14 1231     Chief Complaint  Patient presents with  . Abdominal Pain  . Nausea   (Consider location/radiation/quality/duration/timing/severity/associated sxs/prior Treatment) HPI      39 year old female with a history of a gastric ulcer found by upper endoscopy last month, pancreatitis 2 years ago, diverticulitis, cholecystitis status post cholecystectomy, status post gastric bypass, status post open exploratory laparotomy for unexplained abdominal pain, presents complaining of abdominal pain. She states that this feels similar to when she had pancreatitis in the past. This started 2 days ago with cramping midabdominal pain. It has progressed up until today. She has not had any appetite today. She is nauseous but has not vomited. She has had some diarrhea, watery, nonbloody. Denies fever, chills. Denies urinary symptoms or flank pain. No recent travel or sick contacts. No recent antibiotic use.    Also she complains of 3 days with sinus congestion, drainage, and a mild productive cough. No chest pain or shortness of breath. No sinus pain or pressure  Past Medical History  Diagnosis Date  . Heart murmur 2005  . History of cardiovascular stress test 2005    showed MR and TR  . Anemia    Past Surgical History  Procedure Laterality Date  . Gallbladder removed    . Laproscopy    . Colonoscopy  2010  . Renal endoscopy via nephrostomy / pyelostomy  2010  . Tubal ligation    . Gastric bypass  2012   Family History  Problem Relation Age of Onset  . Hypertension Mother   . Hyperlipidemia Mother   . Hyperlipidemia Father   . Hypertension Father   . Hypertension Sister   . Hypertension Brother    History  Substance Use Topics  . Smoking status: Former Smoker -- 0.00 packs/day for 8 years    Types: Cigarettes    Quit date: 06/04/1999  . Smokeless tobacco: Not on file  .  Alcohol Use: Yes   OB History    No data available     Review of Systems  Constitutional: Negative for fever and chills.  HENT: Positive for congestion and rhinorrhea. Negative for ear pain, sinus pressure and sore throat.   Eyes: Negative for visual disturbance.  Respiratory: Positive for cough. Negative for chest tightness, shortness of breath and wheezing.   Cardiovascular: Negative for chest pain, palpitations and leg swelling.  Gastrointestinal: Positive for nausea, abdominal pain and diarrhea. Negative for vomiting, constipation and blood in stool.  Endocrine: Negative for polydipsia and polyuria.  Genitourinary: Negative for dysuria, urgency and frequency.  Musculoskeletal: Negative for myalgias and arthralgias.  Skin: Negative for rash.  Neurological: Negative for dizziness, weakness and light-headedness.    Allergies  Review of patient's allergies indicates no known allergies.  Home Medications   Prior to Admission medications   Medication Sig Start Date End Date Taking? Authorizing Provider  omeprazole (PRILOSEC) 20 MG capsule Take 20 mg by mouth daily.   Yes Historical Provider, MD  clonazePAM (KLONOPIN) 0.5 MG tablet Take 0.5 mg by mouth daily as needed for anxiety.    Historical Provider, MD  dicyclomine (BENTYL) 20 MG tablet Take 1 tablet (20 mg total) by mouth 4 (four) times daily -  before meals and at bedtime. 12/01/14   Liam Graham, PA-C  ferrous sulfate 325 (65 FE) MG tablet Take 325 mg by mouth daily with breakfast.  Historical Provider, MD  ondansetron (ZOFRAN) 4 MG tablet Take 1 tablet (4 mg total) by mouth every 6 (six) hours as needed for nausea or vomiting. Patient not taking: Reported on 12/01/2014 10/03/14   Carrie Mew, MD  oxyCODONE-acetaminophen (ROXICET) 5-325 MG per tablet Take 1 tablet by mouth every 6 (six) hours as needed for severe pain. Patient not taking: Reported on 12/01/2014 10/03/14   Carrie Mew, MD  polyethylene glycol [facility])  packet Take 17 g by mouth daily. 12/01/14   Liam Graham, PA-C  simethicone (GAS-X) 80 MG chewable tablet Chew 1 tablet (80 mg total) by mouth every 6 (six) hours as needed. 12/01/14   Freeman Caldron Erle Guster, PA-C   BP 129/72 mmHg  Pulse 78  Temp(Src) 98.4 F (36.9 C) (Oral)  Resp 20  Ht 5\' 9"  (1.753 m)  Wt 195 lb (88.451 kg)  BMI 28.78 kg/m2  SpO2 100%  LMP 11/18/2014 Physical Exam  Constitutional: She is oriented to person, place, and time. Vital signs are normal. She appears well-developed and well-nourished. No distress.  HENT:  Head: Normocephalic and atraumatic.  Right Ear: External ear normal.  Left Ear: External ear normal.  Nose: Nose normal.  Mouth/Throat: Oropharynx is clear and moist. No oropharyngeal exudate.  Eyes: Conjunctivae are normal.  Neck: Normal range of motion. Neck supple.  Cardiovascular: Normal rate, regular rhythm, normal heart sounds and intact distal pulses.  Exam reveals no gallop and no friction rub.   No murmur heard. Pulmonary/Chest: Effort normal and breath sounds normal. No respiratory distress. She has no wheezes. She has no rales.  Abdominal: Soft. Normal appearance and bowel sounds are normal. She exhibits no distension and no mass. There is no hepatosplenomegaly. There is tenderness (minimal ) in the right upper quadrant and left lower quadrant. There is no rebound, no guarding and no CVA tenderness. No hernia.  Lymphadenopathy:    She has no cervical adenopathy.  Neurological: She is alert and oriented to person, place, and time. She has normal strength. Coordination normal.  Skin: Skin is warm and dry. No rash noted. She is not diaphoretic.  Psychiatric: She has a normal mood and affect. Judgment normal.  Nursing note and vitals reviewed.   ED Course  Procedures (including critical care time) Labs Review Labs Reviewed  URINALYSIS COMPLETEWITH MICROSCOPIC ([facility] ONLY) - Abnormal; Notable for the following:    Bacteria, UA NONE SEEN (*)     Squamous Epithelial / LPF 0-5 (*)    All other components within normal limits  COMPREHENSIVE METABOLIC PANEL - Abnormal; Notable for the following:    Chloride 99 (*)    ALT 13 (*)    All other components within normal limits  LIPASE, BLOOD - Abnormal; Notable for the following:    Lipase 15 (*)    All other components within normal limits       Ref Range 11:53 AM (12/01/14)       WBC 3.6 - 11.0 K/uL 9.0       RBC 3.80 - 5.20 MIL/uL 4.56       Hemoglobin 12.0 - 16.0 g/dL 10.2 (L)       HCT 35.0 - 47.0 % 31.9 (L)       MCV 80.0 - 100.0 fL 70.1 (L)       MCH 26.0 - 34.0 pg 22.4 (L)       MCHC 32.0 - 36.0 g/dL 32.0       RDW 11.5 - 14.5 % 17.1 (H)  Platelets 150 - 440 K/uL 252       Neutrophils Relative % % 74       Neutro Abs 1.4 - 6.5 K/uL 6.7 (H)       Lymphocytes Relative % 18       Lymphs Abs 1.0 - 3.6 K/uL 1.6       Monocytes Relative % 6       Monocytes Absolute 0.2 - 0.9 K/uL 0.5       Eosinophils Relative % 1       Eosinophils Absolute 0 - 0.7 K/uL 0.1       Basophils Relative % 1       Basophils Absolute 0 - 0.1 K/uL 0.0            Ref Range 11:53 AM  3wk ago      Preg Test, Ur NEGATIVE  NEGATIVE NEGATIVECM   Resulting Agency  [facility]         Imaging Review Dg Abd Acute W/chest  12/01/2014   CLINICAL DATA:  Chest and abdominal pain for 2 days with nausea and vomiting  EXAM: DG ABDOMEN ACUTE W/ 1V CHEST  COMPARISON:  Abdomen series August 28, 2014; CT abdomen and pelvis Oct 03, 2014  FINDINGS: PA chest: There is no edema or consolidation. The heart size and pulmonary vascularity are normal. No adenopathy. No bone lesions.  Supine and upright abdomen: There is moderate stool in the colon. There is no bowel dilatation or air-fluid level suggesting obstruction. No free air. There are multiple surgical clips in the upper abdomen. There are small phleboliths in the pelvis.  IMPRESSION: Bowel gas pattern unremarkable. No obstruction or free air. No edema  or consolidation.   Electronically Signed   By: Lowella Grip III M.D.   On: 12/01/2014 13:52      MDM   1. Generalized abdominal pain    Patient is concerned today about possibility of recurrence of her pancreatitis. She is afebrile and nontoxic in no distress. Her abdominal exam is completely benign with very minimal tenderness. CBC reveals her stable baseline anemia which is actually improved from 3 weeks ago. CMP and lipase are normal. Urinalysis is normal, urine pregnancy is negative. PA chest x-ray and flat and upright abdominal films are negative. She declines pelvic exam. She had one 4 mg Zofran ODT here with some improvement in her nausea. She has no vomiting and is tolerating by mouth fluids. Unclear etiology. We will attempt to treat symptomatically, she is instructed to go to the emergency department if she has any worsening in her condition.  New Prescriptions   DICYCLOMINE (BENTYL) 20 MG TABLET    Take 1 tablet (20 mg total) by mouth 4 (four) times daily -  before meals and at bedtime.   POLYETHYLENE GLYCOL (MIRALAX) PACKET    Take 17 g by mouth daily.   SIMETHICONE (GAS-X) 80 MG CHEWABLE TABLET    Chew 1 tablet (80 mg total) by mouth every 6 (six) hours as needed.       Liam Graham, PA-C 12/01/14 1400

## 2014-12-01 NOTE — ED Notes (Signed)
Pt presents with abd pain and nausea.

## 2014-12-01 NOTE — Progress Notes (Signed)
New pt today. Hx Gastric bypass in 2012. Has known about anemia for a few months now. Hgb 10 at Dr Duke Salvia office. Has had fatigue. Pt has allergies or an URI infection now. Abd pain just started today. Pt has a hx of pancreatitis.B/B normal. No blood in stools. Has heavy menses, Pt scheduled for a hysterectomy in July to remove uterus, ovaries will remain.

## 2014-12-01 NOTE — Discharge Instructions (Signed)
If your pain worsens, go to the emergency department   Abdominal Pain, Women Abdominal (stomach, pelvic, or belly) pain can be caused by many things. It is important to tell your doctor:  The location of the pain.  Does it come and go or is it present all the time?  Are there things that start the pain (eating certain foods, exercise)?  Are there other symptoms associated with the pain (fever, nausea, vomiting, diarrhea)? All of this is helpful to know when trying to find the cause of the pain. CAUSES   Stomach: virus or bacteria infection, or ulcer.  Intestine: appendicitis (inflamed appendix), regional ileitis (Crohn's disease), ulcerative colitis (inflamed colon), irritable bowel syndrome, diverticulitis (inflamed diverticulum of the colon), or cancer of the stomach or intestine.  Gallbladder disease or stones in the gallbladder.  Kidney disease, kidney stones, or infection.  Pancreas infection or cancer.  Fibromyalgia (pain disorder).  Diseases of the female organs:  Uterus: fibroid (non-cancerous) tumors or infection.  Fallopian tubes: infection or tubal pregnancy.  Ovary: cysts or tumors.  Pelvic adhesions (scar tissue).  Endometriosis (uterus lining tissue growing in the pelvis and on the pelvic organs).  Pelvic congestion syndrome (female organs filling up with blood just before the menstrual period).  Pain with the menstrual period.  Pain with ovulation (producing an egg).  Pain with an IUD (intrauterine device, birth control) in the uterus.  Cancer of the female organs.  Functional pain (pain not caused by a disease, may improve without treatment).  Psychological pain.  Depression. DIAGNOSIS  Your doctor will decide the seriousness of your pain by doing an examination.  Blood tests.  X-rays.  Ultrasound.  CT scan (computed tomography, special type of X-ray).  MRI (magnetic resonance imaging).  Cultures, for infection.  Barium enema (dye  inserted in the large intestine, to better view it with X-rays).  Colonoscopy (looking in intestine with a lighted tube).  Laparoscopy (minor surgery, looking in abdomen with a lighted tube).  Major abdominal exploratory surgery (looking in abdomen with a large incision). TREATMENT  The treatment will depend on the cause of the pain.   Many cases can be observed and treated at home.  Over-the-counter medicines recommended by your caregiver.  Prescription medicine.  Antibiotics, for infection.  Birth control pills, for painful periods or for ovulation pain.  Hormone treatment, for endometriosis.  Nerve blocking injections.  Physical therapy.  Antidepressants.  Counseling with a psychologist or psychiatrist.  Minor or major surgery. HOME CARE INSTRUCTIONS   Do not take laxatives, unless directed by your caregiver.  Take over-the-counter pain medicine only if ordered by your caregiver. Do not take aspirin because it can cause an upset stomach or bleeding.  Try a clear liquid diet (broth or water) as ordered by your caregiver. Slowly move to a bland diet, as tolerated, if the pain is related to the stomach or intestine.  Have a thermometer and take your temperature several times a day, and record it.  Bed rest and sleep, if it helps the pain.  Avoid sexual intercourse, if it causes pain.  Avoid stressful situations.  Keep your follow-up appointments and tests, as your caregiver orders.  If the pain does not go away with medicine or surgery, you may try:  Acupuncture.  Relaxation exercises (yoga, meditation).  Group therapy.  Counseling. SEEK MEDICAL CARE IF:   You notice certain foods cause stomach pain.  Your home care treatment is not helping your pain.  You need stronger pain medicine.  You want your IUD removed.  You feel faint or lightheaded.  You develop nausea and vomiting.  You develop a rash.  You are having side effects or an allergy to  your medicine. SEEK IMMEDIATE MEDICAL CARE IF:   Your pain does not go away or gets worse.  You have a fever.  Your pain is felt only in portions of the abdomen. The right side could possibly be appendicitis. The left lower portion of the abdomen could be colitis or diverticulitis.  You are passing blood in your stools (bright red or black tarry stools, with or without vomiting).  You have blood in your urine.  You develop chills, with or without a fever.  You pass out. MAKE SURE YOU:   Understand these instructions.  Will watch your condition.  Will get help right away if you are not doing well or get worse. Document Released: 03/17/2007 Document Revised: 10/04/2013 Document Reviewed: 04/06/2009 [facility] Patient Information 2015 [place], [place]. This information is not intended to replace advice given to you by your health care provider. Make sure you discuss any questions you have with your health care provider.

## 2014-12-02 ENCOUNTER — Other Ambulatory Visit: Payer: Self-pay | Admitting: *Deleted

## 2014-12-02 DIAGNOSIS — D509 Iron deficiency anemia, unspecified: Secondary | ICD-10-CM

## 2014-12-02 HISTORY — PX: ABDOMINAL HYSTERECTOMY: SHX81

## 2014-12-06 ENCOUNTER — Other Ambulatory Visit: Payer: Self-pay | Admitting: Bariatrics

## 2014-12-06 DIAGNOSIS — R1033 Periumbilical pain: Secondary | ICD-10-CM

## 2014-12-08 ENCOUNTER — Inpatient Hospital Stay: Payer: 59 | Attending: Internal Medicine

## 2014-12-08 VITALS — BP 115/79 | HR 82 | Temp 100.5°F | Resp 18

## 2014-12-08 DIAGNOSIS — Z79899 Other long term (current) drug therapy: Secondary | ICD-10-CM | POA: Diagnosis not present

## 2014-12-08 DIAGNOSIS — R102 Pelvic and perineal pain: Secondary | ICD-10-CM

## 2014-12-08 DIAGNOSIS — IMO0002 Reserved for concepts with insufficient information to code with codable children: Secondary | ICD-10-CM | POA: Insufficient documentation

## 2014-12-08 DIAGNOSIS — D509 Iron deficiency anemia, unspecified: Secondary | ICD-10-CM | POA: Diagnosis not present

## 2014-12-08 DIAGNOSIS — N946 Dysmenorrhea, unspecified: Secondary | ICD-10-CM | POA: Insufficient documentation

## 2014-12-08 DIAGNOSIS — G8929 Other chronic pain: Secondary | ICD-10-CM | POA: Insufficient documentation

## 2014-12-08 DIAGNOSIS — Z9884 Bariatric surgery status: Secondary | ICD-10-CM | POA: Insufficient documentation

## 2014-12-08 MED ORDER — SODIUM CHLORIDE 0.9 % IV SOLN
500.0000 mg | Freq: Once | INTRAVENOUS | Status: AC
  Administered 2014-12-08: 500 mg via INTRAVENOUS
  Filled 2014-12-08: qty 25

## 2014-12-09 ENCOUNTER — Ambulatory Visit
Admission: RE | Admit: 2014-12-09 | Discharge: 2014-12-09 | Disposition: A | Discharge status: Home / Self Care | Payer: 59 | Source: Ambulatory Visit | Attending: Bariatrics | Admitting: Bariatrics

## 2014-12-09 DIAGNOSIS — R109 Unspecified abdominal pain: Secondary | ICD-10-CM | POA: Insufficient documentation

## 2014-12-09 DIAGNOSIS — N830 Follicular cyst of ovary: Secondary | ICD-10-CM | POA: Diagnosis not present

## 2014-12-09 DIAGNOSIS — Z9884 Bariatric surgery status: Secondary | ICD-10-CM | POA: Insufficient documentation

## 2014-12-09 DIAGNOSIS — K838 Other specified diseases of biliary tract: Secondary | ICD-10-CM | POA: Insufficient documentation

## 2014-12-09 DIAGNOSIS — R102 Pelvic and perineal pain: Secondary | ICD-10-CM | POA: Insufficient documentation

## 2014-12-09 DIAGNOSIS — Z9049 Acquired absence of other specified parts of digestive tract: Secondary | ICD-10-CM | POA: Diagnosis not present

## 2014-12-09 DIAGNOSIS — R1033 Periumbilical pain: Secondary | ICD-10-CM

## 2014-12-09 MED ORDER — IOHEXOL 300 MG/ML  SOLN
100.0000 mL | Freq: Once | INTRAMUSCULAR | Status: AC | PRN
  Administered 2014-12-09: 100 mL via INTRAVENOUS

## 2014-12-12 ENCOUNTER — Telehealth: Payer: Self-pay | Admitting: *Deleted

## 2014-12-12 NOTE — Progress Notes (Signed)
[place]  Telephone:(336) [phone] [phone]) [phone]     ID: Misty Zamora OB: 23-Jun-1975  MR#: [id number]  OQH#:[id number]  Patient Care Team: Margo Common, PA as PCP - General (Physician Assistant)  CHIEF COMPLAINT/DIAGNOSIS:  Persistent Iron Deficiency Anemia  -  S/p gastric bypass surgery 2012. History of heavy menstrual bleeding. Unresponsive to oral iron therapy (on  OTC iron tablet twice daily for 3-4 months now) along with difficulty tolerating oral iron. (10/26/14 - Serum iron 23, TIBC 595, ferritin 3, iron saturation 4%. Folate 7.6 and B12 is 406 which are normal range.   11/06/14 - hemoglobin 9.5, WBC 6500, hematocrit 30.2, creatinine 0.62, calcium 8.8, LFT unremarkable, albumin 3.4).  -  Patient referred here for Hematology evaluation and consideration of parenteral iron therapy.    HISTORY OF PRESENT ILLNESS:  Misty Zamora is a 39 year old female with past medical history as described below. Patient more recently has been taking OTC iron tablet twice daily for at least 3-4 months now for persistent iron deficiency anemia. States that she has had issues with intermittent abdominal pain, diarrhea and history of pancreatitis in the past and is being planned for CT scan evaluation for that issue. 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 More recently labs on May 25 showed significant iron deficiency with ferritin of 3, iron saturation 4%, serum iron 23, hemoglobin on June 5 was lower at 9.5. B12 and folate unremarkable. States that she is status post gastric bypass surgery about 40 years ago. She also has heavy menstrual periods for many years now. Clinically states that she is feeling more fatigued on minimal activity and exertion. Denies any new dyspnea, angina, palpitation, orthopnea or PND. Denies any bright red blood in stools, melena or hematuria. No new bone pains. No new paresthesias and  extremities. Appetite and weight are steady.  REVIEW OF SYSTEMS:   ROS CONSTITUTIONAL: As in HPI above. No chills, fever or sweats.    ENT:  No headache, dizziness or epistaxis. No ear or jaw pain. Intermittent sinus drainage. RESPIRATORY: History of allergies and sleep apnea. Currently denies cough, dyspnea or wheezing. No hemoptysis. CARDIAC:  No palpitations.  No retrosternal chest pain. No orthopnea, PND. GI:  Intermittent nausea. Episodic diarrhea and abdominal pain. No vomiting. No bright red blood in stool or malena.   GU:  No dysuria or hematuria.  SKIN: No rashes or pruritus. HEMATOLOGIC: denies bleeding symptoms MUSCULOSKELETAL:  No new bone pains.  EXTREMITY:  No new swelling or pain.  NEURO:  No focal weakness. No numbness or tingling of extremities.     ENDOCRINE:  No polyuria or polydipsia.   PAST MEDICAL HISTORY: Reviewed. Past Medical History  Diagnosis Date  . Heart murmur 2005  . History of cardiovascular stress test 2005    showed MR and TR  . Anemia   Told she had scleroderma in past but no symptoms or treatment history.  PAST SURGICAL HISTORY: Reviewed. Past Surgical History  Procedure Laterality Date  . Gallbladder removed    . Laproscopy    . Colonoscopy  2010  . Renal endoscopy via nephrostomy / pyelostomy  2010  . Tubal ligation    . Gastric bypass  2012    FAMILY HISTORY: Reviewed. Family History  Problem Relation Age of Onset  . Hypertension Mother   . Hyperlipidemia Mother   . Hyperlipidemia Father   . Hypertension Father   .  Hypertension Sister   . Hypertension Brother     SOCIAL HISTORY: Reviewed. History  Substance Use Topics  . Smoking status: Former Smoker -- 0.00 packs/day for 8 years    Types: Cigarettes    Quit date: 06/04/1999  . Smokeless tobacco: Not on file  . Alcohol Use: Yes    No Known Allergies  Current Outpatient Prescriptions  Medication Sig Dispense Refill  . clonazePAM (KLONOPIN) 0.5 MG tablet Take 0.5 mg by  mouth daily as needed for anxiety.    . ferrous sulfate 325 (65 FE) MG tablet Take 325 mg by mouth daily with breakfast.    . dicyclomine (BENTYL) 20 MG tablet Take 1 tablet (20 mg total) by mouth 4 (four) times daily -  before meals and at bedtime. 20 tablet 0  . omeprazole (PRILOSEC) 20 MG capsule Take 20 mg by mouth daily.    . ondansetron (ZOFRAN) 4 MG tablet Take 1 tablet (4 mg total) by mouth every 6 (six) hours as needed for nausea or vomiting. (Patient not taking: Reported on 12/01/2014) 20 tablet 1  . oxyCODONE-acetaminophen (ROXICET) 5-325 MG per tablet Take 1 tablet by mouth every 6 (six) hours as needed for severe pain. (Patient not taking: Reported on 12/01/2014) 12 tablet 0  . polyethylene glycol (MIRALAX) packet Take 17 g by mouth daily. 10 each 0  . simethicone (GAS-X) 80 MG chewable tablet Chew 1 tablet (80 mg total) by mouth every 6 (six) hours as needed. 20 tablet 0   No current facility-administered medications for this visit.    PHYSICAL EXAM: Filed Vitals:   12/01/14 1047  BP: 119/76  Pulse: 87  Temp: 98.6 F (37 C)     Body mass index is 28.73 kg/(m^2).       GENERAL: Patient is alert and oriented and in no acute distress. There is no icterus. HEENT: EOMs intact. Oral exam negative for thrush or lesions. No cervical lymphadenopathy. CVS: S1S2, regular LUNGS: Bilaterally clear to auscultation, no rhonchi. ABDOMEN: Soft, nontender. No hepatosplenomegaly clinically.  NEURO: grossly nonfocal, cranial nerves are intact.   EXTREMITIES: No pedal edema. LYMPHATICS: No palpable adenopathy in axillary or inguinal areas. SKIN: No major bruising or rash MUSCULOSKELETAL: No obvious joint redness or swelling   LAB RESULTS:    Component Value Date/Time   NA 135 12/01/2014 1251   NA 135* 06/21/2013 1718   K 4.3 12/01/2014 1251   K 3.9 06/21/2013 1718   CL 99* 12/01/2014 1251   CL 104 06/21/2013 1718   CO2 29 12/01/2014 1251   CO2 28 06/21/2013 1718   GLUCOSE 86  12/01/2014 1251   GLUCOSE 83 06/21/2013 1718   BUN 7 12/01/2014 1251   BUN 5* 06/21/2013 1718   CREATININE 0.59 12/01/2014 1251   CREATININE 0.62 06/21/2013 1718   CALCIUM 8.9 12/01/2014 1251   CALCIUM 8.8 06/21/2013 1718   PROT 7.4 12/01/2014 1251   PROT 7.4 06/21/2013 1718   ALBUMIN 3.9 12/01/2014 1251   ALBUMIN 3.7 06/21/2013 1718   AST 17 12/01/2014 1251   AST 20 06/21/2013 1718   ALT 13* 12/01/2014 1251   ALT 17 06/21/2013 1718   ALKPHOS 57 12/01/2014 1251   ALKPHOS 58 06/21/2013 1718   BILITOT 0.3 12/01/2014 1251   GFRNONAA >60 12/01/2014 1251   GFRNONAA >60 06/21/2013 1718   GFRAA >60 12/01/2014 1251   GFRAA >60 06/21/2013 1718   Lab Results  Component Value Date   WBC 9.0 12/01/2014   NEUTROABS 6.7* 12/01/2014  HGB 10.2* 12/01/2014   HCT 31.9* 12/01/2014   MCV 70.1* 12/01/2014   PLT 252 12/01/2014   Serum iron 26, ferritin low at 6, TIBC elevated at 518, iron saturation low at 5%.   STUDIES: 10/26/14 - Serum iron 23, TIBC 595, ferritin 3, iron saturation 4%. Folate 7.6 and B12 is 406 which are normal range. 11/06/14 - hemoglobin 9.5, WBC 6500, hematocrit 30.2, creatinine 0.62, calcium 8.8, LFT unremarkable, albumin 3.4.   ASSESSMENT / PLAN:   Persistent Iron Deficiency Anemia  -  S/p gastric bypass surgery 2012. History of heavy menstrual bleeding. Unresponsive to oral iron therapy (on  OTC iron tablet twice daily for 3-4 months now) along with difficulty tolerating oral iron.  (10/26/14 - Serum iron 23, TIBC 595, ferritin 3, iron saturation 4%. Folate 7.6 and B12 is 406 which are normal range.   11/06/14 - hemoglobin 9.5, WBC 6500, hematocrit 30.2, creatinine 0.62, calcium 8.8, LFT unremarkable, albumin 3.4) - Patient referred here for Hematology evaluation and consideration of parenteral iron therapy. Have reviewed records sent by referring physician along with labs and discussed with patient in detail. Despite taking oral iron which she does have difficulty  tolerating also, anemia has not responded and labs done on May 25 and June 5 showed that she has persistent severe iron deficiency along with drop in hemoglobin to 9.5 g. Patient also has intermittent abdominal pain with diarrhea and states that she is being planned for CT scan evaluation soon given that she also has history of pancreatitis in the past. Given this, she is a candidate to pursue parenteral iron therapy. Will get urine pregnancy test to make sure it is negative, and then schedule her for parenteral iron therapy visit Venofer 500 mg IV on July 7 and July 19. Patient explained about rationale for Venofer treatment, possible benefits and side effects, she is agreeable to this and expressed verbal consent. Will then follow up at about 10-12 weeks with repeat CBC and iron study and make further plan of management including decide if she would need maintenance parenteral iron therapy since she is s/p gastric bypass surgery.     In between visits, the patient has been advised to call or come to the ER in case of fevers, chills, bleeding, acute sickness, or new symptoms. Patient is agreeable to this plan.      Leia Alf, MD   12/12/2014 2:24 PM

## 2014-12-12 NOTE — Telephone Encounter (Signed)
I called her back and asked that she contact her PMD regarding her sx. Had to leave a message

## 2014-12-20 ENCOUNTER — Inpatient Hospital Stay: Payer: 59

## 2014-12-20 VITALS — BP 109/62 | HR 67 | Temp 97.8°F | Resp 18

## 2014-12-20 DIAGNOSIS — D509 Iron deficiency anemia, unspecified: Secondary | ICD-10-CM

## 2014-12-20 MED ORDER — SODIUM CHLORIDE 0.9 % IV SOLN
500.0000 mg | Freq: Once | INTRAVENOUS | Status: AC
  Administered 2014-12-20: 500 mg via INTRAVENOUS
  Filled 2014-12-20: qty 25

## 2014-12-21 ENCOUNTER — Encounter
Admission: RE | Admit: 2014-12-21 | Discharge: 2014-12-21 | Disposition: A | Discharge status: Home / Self Care | Payer: 59 | Source: Ambulatory Visit | Attending: Obstetrics and Gynecology | Admitting: Obstetrics and Gynecology

## 2014-12-21 DIAGNOSIS — Z01812 Encounter for preprocedural laboratory examination: Secondary | ICD-10-CM | POA: Insufficient documentation

## 2014-12-21 HISTORY — DX: Essential (primary) hypertension: I10

## 2014-12-21 HISTORY — DX: Major depressive disorder, single episode, unspecified: F32.9

## 2014-12-21 HISTORY — DX: Sleep apnea, unspecified: G47.30

## 2014-12-21 HISTORY — DX: Anxiety disorder, unspecified: F41.9

## 2014-12-21 HISTORY — DX: Other specified postprocedural states: Z98.890

## 2014-12-21 HISTORY — DX: Other specified postprocedural states: R11.2

## 2014-12-21 HISTORY — DX: Gastro-esophageal reflux disease without esophagitis: K21.9

## 2014-12-21 HISTORY — DX: Depression, unspecified: F32.A

## 2014-12-21 LAB — CBC
HEMATOCRIT: 33 % — AB (ref 35.0–47.0)
Hemoglobin: 10.6 g/dL — ABNORMAL LOW (ref 12.0–16.0)
MCH: 23.4 pg — AB (ref 26.0–34.0)
MCHC: 32 g/dL (ref 32.0–36.0)
MCV: 73.3 fL — AB (ref 80.0–100.0)
PLATELETS: 229 10*3/uL (ref 150–440)
RBC: 4.51 MIL/uL (ref 3.80–5.20)
RDW: 20.9 % — ABNORMAL HIGH (ref 11.5–14.5)
WBC: 5 10*3/uL (ref 3.6–11.0)

## 2014-12-21 LAB — COMPREHENSIVE METABOLIC PANEL
ALBUMIN: 3.7 g/dL (ref 3.5–5.0)
ALT: 13 U/L — AB (ref 14–54)
ANION GAP: 4 — AB (ref 5–15)
AST: 18 U/L (ref 15–41)
Alkaline Phosphatase: 44 U/L (ref 38–126)
BILIRUBIN TOTAL: 0.2 mg/dL — AB (ref 0.3–1.2)
BUN: 5 mg/dL — ABNORMAL LOW (ref 6–20)
CHLORIDE: 106 mmol/L (ref 101–111)
CO2: 30 mmol/L (ref 22–32)
Calcium: 8.9 mg/dL (ref 8.9–10.3)
Creatinine, Ser: 0.69 mg/dL (ref 0.44–1.00)
GFR calc Af Amer: >60 mL/min (ref >60)
GFR calc non Af Amer: >60 mL/min (ref >60)
Glucose, Bld: 84 mg/dL (ref 65–99)
Potassium: 3.8 mmol/L (ref 3.5–5.1)
SODIUM: 140 mmol/L (ref 135–145)
TOTAL PROTEIN: 6.6 g/dL (ref 6.5–8.1)

## 2014-12-21 LAB — TYPE AND SCREEN
ABO/RH(D): A POS
ANTIBODY SCREEN: NEGATIVE

## 2014-12-21 LAB — ABO/RH: ABO/RH(D): A POS

## 2014-12-21 NOTE — Patient Instructions (Signed)
  Your procedure is scheduled on: July 26 Report to [place]. To find out your arrival time please call [phone] between 1PM - 3PM on July 25  Remember: Instructions that are not followed completely may result in serious medical risk, up to and including death, or upon the discretion of your surgeon and anesthesiologist your surgery may need to be rescheduled.    __x__ 1. Do not eat food or drink liquids after midnight. No gum chewing or hard candies.     __x__ 2. No Alcohol for 24 hours before or after surgery.   ____ 3. Bring all medications with you on the day of surgery if instructed.    __x__ 4. Notify your doctor if there is any change in your medical condition     (cold, fever, infections).     Do not wear jewelry, make-up, hairpins, clips or nail polish.  Do not wear lotions, powders, or perfumes. You may wear deodorant.  Do not shave 48 hours prior to surgery. Men may shave face and neck.  Do not bring valuables to the hospital.    [facility] is not responsible for any belongings or valuables.               Contacts, dentures or bridgework may not be worn into surgery.  Leave your suitcase in the car. After surgery it may be brought to your room.  For patients admitted to the hospital, discharge time is determined by your                treatment team.   Patients discharged the day of surgery will not be allowed to drive home.   Please read over the following fact sheets that you were given:      __x__ Take these medicines the morning of surgery with A SIP OF WATER:    1. Omeprazole  2.   3.   4.  5.  6.  ____ Fleet Enema (as directed)   __x__ Use CHG Soap as directed  ____ Use inhalers on the day of surgery  ____ Stop metformin 2 days prior to surgery    ____ Take 1/2 of usual insulin dose the night before surgery and none on the morning of surgery.   ____ Stop Coumadin/Plavix/aspirin on/ does not take  ____ Stop  Anti-inflammatories on / does not take   ____ Stop supplements until after surgery.    ____ Bring C-Pap to the hospital.      .      . .  Marland Kitchen

## 2014-12-27 ENCOUNTER — Ambulatory Visit: Payer: 59 | Admitting: Anesthesiology

## 2014-12-27 ENCOUNTER — Encounter
Admission: RE | Disposition: A | Discharge status: Home / Self Care | Payer: Self-pay | Source: Ambulatory Visit | Attending: Obstetrics and Gynecology

## 2014-12-27 ENCOUNTER — Observation Stay
Admission: RE | Admit: 2014-12-27 | Discharge: 2014-12-28 | Disposition: A | Discharge status: Home / Self Care | Payer: 59 | Source: Ambulatory Visit | Attending: Obstetrics and Gynecology | Admitting: Obstetrics and Gynecology

## 2014-12-27 ENCOUNTER — Encounter: Payer: Self-pay | Admitting: *Deleted

## 2014-12-27 DIAGNOSIS — G8929 Other chronic pain: Principal | ICD-10-CM | POA: Insufficient documentation

## 2014-12-27 DIAGNOSIS — E119 Type 2 diabetes mellitus without complications: Secondary | ICD-10-CM | POA: Diagnosis not present

## 2014-12-27 DIAGNOSIS — D571 Sickle-cell disease without crisis: Secondary | ICD-10-CM | POA: Insufficient documentation

## 2014-12-27 DIAGNOSIS — Z8042 Family history of malignant neoplasm of prostate: Secondary | ICD-10-CM | POA: Insufficient documentation

## 2014-12-27 DIAGNOSIS — I509 Heart failure, unspecified: Secondary | ICD-10-CM | POA: Diagnosis not present

## 2014-12-27 DIAGNOSIS — K219 Gastro-esophageal reflux disease without esophagitis: Secondary | ICD-10-CM | POA: Insufficient documentation

## 2014-12-27 DIAGNOSIS — N72 Inflammatory disease of cervix uteri: Secondary | ICD-10-CM | POA: Insufficient documentation

## 2014-12-27 DIAGNOSIS — N7011 Chronic salpingitis: Secondary | ICD-10-CM | POA: Insufficient documentation

## 2014-12-27 DIAGNOSIS — R102 Pelvic and perineal pain: Secondary | ICD-10-CM | POA: Insufficient documentation

## 2014-12-27 DIAGNOSIS — J45909 Unspecified asthma, uncomplicated: Secondary | ICD-10-CM | POA: Insufficient documentation

## 2014-12-27 DIAGNOSIS — R011 Cardiac murmur, unspecified: Secondary | ICD-10-CM | POA: Insufficient documentation

## 2014-12-27 DIAGNOSIS — N289 Disorder of kidney and ureter, unspecified: Secondary | ICD-10-CM | POA: Insufficient documentation

## 2014-12-27 DIAGNOSIS — I10 Essential (primary) hypertension: Secondary | ICD-10-CM | POA: Diagnosis not present

## 2014-12-27 DIAGNOSIS — Z801 Family history of malignant neoplasm of trachea, bronchus and lung: Secondary | ICD-10-CM | POA: Diagnosis not present

## 2014-12-27 DIAGNOSIS — Z803 Family history of malignant neoplasm of breast: Secondary | ICD-10-CM | POA: Diagnosis not present

## 2014-12-27 DIAGNOSIS — R0602 Shortness of breath: Secondary | ICD-10-CM | POA: Insufficient documentation

## 2014-12-27 DIAGNOSIS — Z90710 Acquired absence of both cervix and uterus: Secondary | ICD-10-CM

## 2014-12-27 DIAGNOSIS — F419 Anxiety disorder, unspecified: Secondary | ICD-10-CM | POA: Insufficient documentation

## 2014-12-27 DIAGNOSIS — N920 Excessive and frequent menstruation with regular cycle: Secondary | ICD-10-CM | POA: Diagnosis not present

## 2014-12-27 DIAGNOSIS — Z79899 Other long term (current) drug therapy: Secondary | ICD-10-CM | POA: Diagnosis not present

## 2014-12-27 DIAGNOSIS — F329 Major depressive disorder, single episode, unspecified: Secondary | ICD-10-CM | POA: Insufficient documentation

## 2014-12-27 DIAGNOSIS — Z87891 Personal history of nicotine dependence: Secondary | ICD-10-CM | POA: Insufficient documentation

## 2014-12-27 DIAGNOSIS — Z8249 Family history of ischemic heart disease and other diseases of the circulatory system: Secondary | ICD-10-CM | POA: Diagnosis not present

## 2014-12-27 DIAGNOSIS — D282 Benign neoplasm of uterine tubes and ligaments: Secondary | ICD-10-CM | POA: Insufficient documentation

## 2014-12-27 DIAGNOSIS — G4730 Sleep apnea, unspecified: Secondary | ICD-10-CM | POA: Insufficient documentation

## 2014-12-27 HISTORY — PX: CYSTOSCOPY: SHX5120

## 2014-12-27 HISTORY — PX: LAPAROSCOPIC HYSTERECTOMY: SHX1926

## 2014-12-27 HISTORY — PX: BILATERAL SALPINGECTOMY: SHX5743

## 2014-12-27 LAB — POCT PREGNANCY, URINE: Preg Test, Ur: NEGATIVE

## 2014-12-27 SURGERY — HYSTERECTOMY, TOTAL, LAPAROSCOPIC
Anesthesia: General | Wound class: Clean Contaminated

## 2014-12-27 MED ORDER — PROMETHAZINE HCL 25 MG/ML IJ SOLN
12.5000 mg | Freq: Four times a day (QID) | INTRAMUSCULAR | Status: DC | PRN
  Administered 2014-12-27 – 2014-12-28 (×3): 12.5 mg via INTRAVENOUS
  Filled 2014-12-27 (×3): qty 1

## 2014-12-27 MED ORDER — OXYCODONE HCL 5 MG PO TABS
10.0000 mg | ORAL_TABLET | ORAL | Status: DC | PRN
  Administered 2014-12-28 (×3): 10 mg via ORAL
  Filled 2014-12-27 (×3): qty 2

## 2014-12-27 MED ORDER — SIMETHICONE 80 MG PO CHEW: 80.0000 mg | CHEWABLE_TABLET | Freq: Four times a day (QID) | ORAL | Status: DC | PRN

## 2014-12-27 MED ORDER — ONDANSETRON HCL 4 MG PO TABS: 4.0000 mg | ORAL_TABLET | Freq: Four times a day (QID) | ORAL | Status: DC | PRN

## 2014-12-27 MED ORDER — ONDANSETRON HCL 4 MG/2ML IJ SOLN
INTRAMUSCULAR | Status: AC
  Administered 2014-12-27: 4 mg via INTRAVENOUS
  Filled 2014-12-27: qty 2

## 2014-12-27 MED ORDER — HYDROMORPHONE HCL 1 MG/ML IJ SOLN
INTRAMUSCULAR | Status: AC
  Administered 2014-12-27: 0.25 mg via INTRAVENOUS
  Filled 2014-12-27: qty 1

## 2014-12-27 MED ORDER — ACETAMINOPHEN 500 MG PO TABS
1000.0000 mg | ORAL_TABLET | Freq: Four times a day (QID) | ORAL | Status: DC
  Administered 2014-12-27 – 2014-12-28 (×4): 1000 mg via ORAL
  Filled 2014-12-27 (×4): qty 2

## 2014-12-27 MED ORDER — DEXAMETHASONE SODIUM PHOSPHATE 4 MG/ML IJ SOLN
INTRAMUSCULAR | Status: DC | PRN
  Administered 2014-12-27: 5 mg via INTRAVENOUS

## 2014-12-27 MED ORDER — NEOSTIGMINE METHYLSULFATE 10 MG/10ML IV SOLN
INTRAVENOUS | Status: DC | PRN
  Administered 2014-12-27: 4 mg via INTRAVENOUS

## 2014-12-27 MED ORDER — PANTOPRAZOLE SODIUM 40 MG PO TBEC
40.0000 mg | DELAYED_RELEASE_TABLET | Freq: Every day | ORAL | Status: DC
  Administered 2014-12-27 – 2014-12-28 (×2): 40 mg via ORAL
  Filled 2014-12-27 (×2): qty 1

## 2014-12-27 MED ORDER — ACETAMINOPHEN 10 MG/ML IV SOLN
INTRAVENOUS | Status: AC
  Filled 2014-12-27: qty 100

## 2014-12-27 MED ORDER — HYDROMORPHONE HCL 1 MG/ML IJ SOLN
0.2500 mg | INTRAMUSCULAR | Status: DC | PRN
  Administered 2014-12-27 (×4): 0.25 mg via INTRAVENOUS

## 2014-12-27 MED ORDER — EPHEDRINE SULFATE 50 MG/ML IJ SOLN
INTRAMUSCULAR | Status: DC | PRN
  Administered 2014-12-27: 5 mg via INTRAVENOUS

## 2014-12-27 MED ORDER — BUPIVACAINE HCL (PF) 0.5 % IJ SOLN
INTRAMUSCULAR | Status: AC
  Filled 2014-12-27: qty 30

## 2014-12-27 MED ORDER — FENTANYL CITRATE (PF) 100 MCG/2ML IJ SOLN
INTRAMUSCULAR | Status: DC | PRN
  Administered 2014-12-27: 50 ug via INTRAVENOUS
  Administered 2014-12-27: 100 ug via INTRAVENOUS
  Administered 2014-12-27 (×2): 50 ug via INTRAVENOUS

## 2014-12-27 MED ORDER — PHENYLEPHRINE HCL 10 MG/ML IJ SOLN
INTRAMUSCULAR | Status: DC | PRN
  Administered 2014-12-27: 100 ug via INTRAVENOUS
  Administered 2014-12-27 (×2): 50 ug via INTRAVENOUS
  Administered 2014-12-27: 100 ug via INTRAVENOUS

## 2014-12-27 MED ORDER — FENTANYL CITRATE (PF) 100 MCG/2ML IJ SOLN
25.0000 ug | INTRAMUSCULAR | Status: DC | PRN
  Administered 2014-12-27 (×4): 25 ug via INTRAVENOUS

## 2014-12-27 MED ORDER — MENTHOL 3 MG MT LOZG: 1.0000 | LOZENGE | OROMUCOSAL | Status: DC | PRN

## 2014-12-27 MED ORDER — ONDANSETRON HCL 4 MG/2ML IJ SOLN
INTRAMUSCULAR | Status: DC | PRN
  Administered 2014-12-27: 4 mg via INTRAVENOUS

## 2014-12-27 MED ORDER — LACTATED RINGERS IV SOLN
INTRAVENOUS | Status: DC
  Administered 2014-12-27 (×2): via INTRAVENOUS

## 2014-12-27 MED ORDER — LACTATED RINGERS IV SOLN: INTRAVENOUS | Status: DC

## 2014-12-27 MED ORDER — MIDAZOLAM HCL 2 MG/2ML IJ SOLN
INTRAMUSCULAR | Status: DC | PRN
  Administered 2014-12-27: 2 mg via INTRAVENOUS

## 2014-12-27 MED ORDER — PROPOFOL 10 MG/ML IV BOLUS
INTRAVENOUS | Status: DC | PRN
  Administered 2014-12-27: 30 mg via INTRAVENOUS
  Administered 2014-12-27: 170 mg via INTRAVENOUS

## 2014-12-27 MED ORDER — ROCURONIUM BROMIDE 100 MG/10ML IV SOLN
INTRAVENOUS | Status: DC | PRN
  Administered 2014-12-27: 40 mg via INTRAVENOUS
  Administered 2014-12-27 (×2): 10 mg via INTRAVENOUS
  Administered 2014-12-27: 20 mg via INTRAVENOUS
  Administered 2014-12-27: 10 mg via INTRAVENOUS

## 2014-12-27 MED ORDER — BUPIVACAINE HCL 0.5 % IJ SOLN
INTRAMUSCULAR | Status: DC | PRN
  Administered 2014-12-27: 14 mL

## 2014-12-27 MED ORDER — IBUPROFEN 600 MG PO TABS
600.0000 mg | ORAL_TABLET | Freq: Four times a day (QID) | ORAL | Status: DC
  Administered 2014-12-27: 600 mg via ORAL
  Filled 2014-12-27: qty 1

## 2014-12-27 MED ORDER — HYDROMORPHONE HCL 1 MG/ML IJ SOLN
1.0000 mg | INTRAMUSCULAR | Status: AC
  Administered 2014-12-27: 1 mg via INTRAVENOUS
  Filled 2014-12-27: qty 1

## 2014-12-27 MED ORDER — CEFOTETAN DISODIUM 2 G IJ SOLR
2.0000 g | INTRAMUSCULAR | Status: AC
  Administered 2014-12-27: 2 g via INTRAVENOUS
  Filled 2014-12-27: qty 2

## 2014-12-27 MED ORDER — OXYCODONE HCL 5 MG PO TABS
5.0000 mg | ORAL_TABLET | ORAL | Status: DC | PRN
  Administered 2014-12-27: 5 mg via ORAL
  Filled 2014-12-27: qty 1

## 2014-12-27 MED ORDER — LIDOCAINE HCL (CARDIAC) 20 MG/ML IV SOLN
INTRAVENOUS | Status: DC | PRN
  Administered 2014-12-27: 100 mg via INTRAVENOUS

## 2014-12-27 MED ORDER — GLYCOPYRROLATE 0.2 MG/ML IJ SOLN
INTRAMUSCULAR | Status: DC | PRN
  Administered 2014-12-27: 0.6 mg via INTRAVENOUS

## 2014-12-27 MED ORDER — ONDANSETRON HCL 4 MG/2ML IJ SOLN
4.0000 mg | Freq: Four times a day (QID) | INTRAMUSCULAR | Status: DC | PRN
  Administered 2014-12-27: 4 mg via INTRAVENOUS
  Filled 2014-12-27: qty 2

## 2014-12-27 MED ORDER — ONDANSETRON HCL 4 MG/2ML IJ SOLN
4.0000 mg | Freq: Once | INTRAMUSCULAR | Status: AC | PRN
  Administered 2014-12-27: 4 mg via INTRAVENOUS

## 2014-12-27 MED ORDER — ACETAMINOPHEN 10 MG/ML IV SOLN
INTRAVENOUS | Status: DC | PRN
  Administered 2014-12-27: 1000 mg via INTRAVENOUS

## 2014-12-27 MED ORDER — DOCUSATE SODIUM 100 MG PO CAPS
100.0000 mg | ORAL_CAPSULE | Freq: Two times a day (BID) | ORAL | Status: DC
  Administered 2014-12-27 – 2014-12-28 (×3): 100 mg via ORAL
  Filled 2014-12-27 (×3): qty 1

## 2014-12-27 MED ORDER — FENTANYL CITRATE (PF) 100 MCG/2ML IJ SOLN
INTRAMUSCULAR | Status: AC
  Administered 2014-12-27: 25 ug via INTRAVENOUS
  Filled 2014-12-27: qty 2

## 2014-12-27 MED ORDER — HEMOSTATIC AGENTS (NO CHARGE) OPTIME
TOPICAL | Status: DC | PRN
  Administered 2014-12-27: 1 via TOPICAL

## 2014-12-27 SURGICAL SUPPLY — 58 items
APPLICATOR ARISTA FLEXITIP XL (MISCELLANEOUS) ×3 IMPLANT
BAG URO DRAIN 2000ML W/SPOUT (MISCELLANEOUS) ×3 IMPLANT
BLADE SURG SZ11 CARB STEEL (BLADE) ×3 IMPLANT
CATH FOLEY 2WAY  5CC 16FR (CATHETERS) ×1
CATH ROBINSON RED A/P 16FR (CATHETERS) ×3 IMPLANT
CATH URTH 16FR FL 2W BLN LF (CATHETERS) ×2 IMPLANT
CHLORAPREP W/TINT 26ML (MISCELLANEOUS) ×3 IMPLANT
DEVICE SUTURE ENDOST 10MM (ENDOMECHANICALS) ×3 IMPLANT
DEVICE TROCAR PUNCTURE CLOSURE (ENDOMECHANICALS) IMPLANT
DRAPE SHEET LG 3/4 BI-LAMINATE (DRAPES) ×3 IMPLANT
DRAPE UNDER BUTTOCK W/FLU (DRAPES) ×3 IMPLANT
DRAPE XRAY CASSETTE 23X24 (DRAPES) IMPLANT
ENDOSTITCH 0 SINGLE 48 (SUTURE) IMPLANT
GLOVE BIO SURGEON STRL SZ7 (GLOVE) ×9 IMPLANT
GLOVE BIOGEL PI IND STRL 7.5 (GLOVE) ×6 IMPLANT
GLOVE BIOGEL PI INDICATOR 7.5 (GLOVE) ×3
GLOVE INDICATOR 7.5 STRL GRN (GLOVE) ×9 IMPLANT
GOWN STRL REUS W/ TWL LRG LVL3 (GOWN DISPOSABLE) ×10 IMPLANT
GOWN STRL REUS W/ TWL XL LVL3 (GOWN DISPOSABLE) ×2 IMPLANT
GOWN STRL REUS W/TWL LRG LVL3 (GOWN DISPOSABLE) ×5
GOWN STRL REUS W/TWL XL LVL3 (GOWN DISPOSABLE) ×1
GRASPER SUT TROCAR 14GX15 (MISCELLANEOUS) ×3 IMPLANT
HEMOSTAT ARISTA ABSORB 3G PWDR (MISCELLANEOUS) ×3 IMPLANT
IRRIGATION STRYKERFLOW (MISCELLANEOUS) ×2 IMPLANT
IRRIGATOR STRYKERFLOW (MISCELLANEOUS) ×3
IV LACTATED RINGERS 1000ML (IV SOLUTION) ×3 IMPLANT
JELLY LUB 2OZ STRL (MISCELLANEOUS) ×1
JELLY LUBE 2OZ STRL (MISCELLANEOUS) ×2 IMPLANT
KIT RM TURNOVER CYSTO AR (KITS) ×3 IMPLANT
LABEL OR SOLS (LABEL) ×3 IMPLANT
LIGASURE BLUNT 5MM 37CM (INSTRUMENTS) ×3 IMPLANT
LIQUID BAND (GAUZE/BANDAGES/DRESSINGS) ×6 IMPLANT
MANIPULATOR VCARE LG CRV RETR (MISCELLANEOUS) ×3 IMPLANT
MANIPULATOR VCARE STD CRV RETR (MISCELLANEOUS) IMPLANT
NDL SAFETY 22GX1.5 (NEEDLE) ×3 IMPLANT
OCCLUDER COLPOPNEUMO (BALLOONS) ×3 IMPLANT
PACK CYSTO AR (MISCELLANEOUS) IMPLANT
PACK LAP CHOLECYSTECTOMY (MISCELLANEOUS) ×3 IMPLANT
PAD OB MATERNITY 4.3X12.25 (PERSONAL CARE ITEMS) ×3 IMPLANT
PAD PREP 24X41 OB/GYN DISP (PERSONAL CARE ITEMS) ×3 IMPLANT
SCISSORS METZENBAUM CVD 33 (INSTRUMENTS) ×3 IMPLANT
SET CYSTO W/LG BORE CLAMP LF (SET/KITS/TRAYS/PACK) ×3 IMPLANT
SHEARS HARMONIC ACE PLUS 36CM (ENDOMECHANICALS) IMPLANT
SLEEVE ENDOPATH XCEL 5M (ENDOMECHANICALS) ×6 IMPLANT
SOL PREP PVP 2OZ (MISCELLANEOUS) ×3
SOLUTION PREP PVP 2OZ (MISCELLANEOUS) ×2 IMPLANT
SPONGE LAP 18X18 5 PK (GAUZE/BANDAGES/DRESSINGS) ×3 IMPLANT
SPONGE XRAY 4X4 16PLY STRL (MISCELLANEOUS) ×3 IMPLANT
SUT ENDO VLOC 180-0-8IN (SUTURE) ×9 IMPLANT
SUT VIC AB 0 CT1 36 (SUTURE) ×9 IMPLANT
SUT VIC AB 4-0 SH 27 (SUTURE) ×1
SUT VIC AB 4-0 SH 27XANBCTRL (SUTURE) ×2 IMPLANT
SYR 50ML LL SCALE MARK (SYRINGE) ×3 IMPLANT
SYRINGE 10CC LL (SYRINGE) ×3 IMPLANT
TROCAR ENDO BLADELESS 11MM (ENDOMECHANICALS) ×3 IMPLANT
TROCAR XCEL NON-BLD 5MMX100MML (ENDOMECHANICALS) ×3 IMPLANT
TROCAR XCEL UNIV SLVE 11M 100M (ENDOMECHANICALS) ×3 IMPLANT
TUBING INSUFFLATOR HEATED (MISCELLANEOUS) ×3 IMPLANT

## 2014-12-27 NOTE — Anesthesia Procedure Notes (Signed)
Procedure Name: Intubation Date/Time: 12/27/2014 9:47 AM Performed by: Aline Brochure Pre-anesthesia Checklist: Patient identified, Suction available, Emergency Drugs available and Patient being monitored Patient Re-evaluated:Patient Re-evaluated prior to inductionOxygen Delivery Method: Circle system utilized Preoxygenation: Pre-oxygenation with 100% oxygen Intubation Type: IV induction Ventilation: Mask ventilation without difficulty Laryngoscope Size: Mac and 3 Grade View: Grade II Tube type: Oral Number of attempts: 1 Airway Equipment and Method: Patient positioned with wedge pillow and Stylet Placement Confirmation: positive ETCO2 and breath sounds checked- equal and bilateral Secured at: 23 cm Tube secured with: Tape Dental Injury: Teeth and Oropharynx as per pre-operative assessment

## 2014-12-27 NOTE — Anesthesia Preprocedure Evaluation (Addendum)
Anesthesia Evaluation    History of Anesthesia Complications (+) PONV and history of anesthetic complications  Airway Mallampati: III       Dental  (+) Teeth Intact   Pulmonary shortness of breath and with exertion, asthma , sleep apnea , former smoker,  breath sounds clear to auscultation        Cardiovascular hypertension, + Valvular Problems/Murmurs Rhythm:regular Rate:Normal     Neuro/Psych PSYCHIATRIC DISORDERS    GI/Hepatic GERD-  ,  Endo/Other    Renal/GU      Musculoskeletal   Abdominal   Peds  Hematology  (+) anemia ,   Anesthesia Other Findings   Reproductive/Obstetrics                            Anesthesia Physical Anesthesia Plan  ASA: III  Anesthesia Plan: General ETT   Post-op Pain Management:    Induction:   Airway Management Planned:   Additional Equipment:   Intra-op Plan:   Post-operative Plan:   Informed Consent: I have reviewed the patients History and Physical, chart, labs and discussed the procedure including the risks, benefits and alternatives for the proposed anesthesia with the patient or authorized representative who has indicated his/her understanding and acceptance.     Plan Discussed with:   Anesthesia Plan Comments:         Anesthesia Quick Evaluation

## 2014-12-27 NOTE — H&P (Signed)
History and Physical Interval Note:  Misty Zamora  has presented today for surgery, with the diagnosis of CHRONIC PELVIC PAIN  The various methods of treatment have been discussed with the patient and family. After consideration of risks, benefits and other options for treatment, the patient has consented to  Procedure(s): HYSTERECTOMY TOTAL LAPAROSCOPIC (N/A) BILATERAL SALPINGECTOMY (Bilateral) CYSTOSCOPY (N/A) as a surgical intervention .  The patient's history has been reviewed, patient examined, no change in status, stable for surgery.  I have reviewed the patient's chart and labs.  Questions were answered to the patient's satisfaction.  The patient understands that an open laparotomy may be required, if the surgery is unsafe to perform laparoscopically given her history of multiple abdominal surgeries.  The patient does not take a beta blocker and one is not indicated for this surgery.  Will Bonnet, MD 12/27/2014 9:26 AM

## 2014-12-27 NOTE — Op Note (Signed)
Operative Note   12/27/2014 12:54 PM  PRE-OP DIAGNOSIS:  1) Chronic pelvic pain  2) menorrhagia with irregular cycle   POST-OP DIAGNOSIS:  1) Chronic pelvic pain  2) menorrhagia with irregular cycle   SURGEON: Surgeon(s) and Role:    * Will Bonnet, MD - Primary    * Malachy Mood, MD - Assisting  ANESTHESIA: Gen. endotracheal  PROCEDURE: Procedure(s): 1) HYSTERECTOMY TOTAL LAPAROSCOPIC 2) BILATERAL SALPINGECTOMY 3) CYSTOSCOPY   ESTIMATED BLOOD LOSS: 200 mL  DRAINS: None   TOTAL IV FLUIDS: 1100 mL crystalloid  SPECIMENS:  1) uterus and cervix 2) bilateral fallopian tubes  COMPLICATIONS: None  DISPOSITION: PACU - hemodynamically stable.  CONDITION: stable  PROCEDURE IN DETAIL: After informed consent was obtained, the patient was taken to the operating room where anesthesia was obtained without difficulty. The patient was positioned in the dorsal lithotomy position in [place] and her arms were carefully tucked at her sides and the usual precautions were taken.  She was prepped and draped in normal sterile fashion.  Time-out was performed and a Foley catheter was placed into the bladder and a large VCare uterine manipulator was placed in the uterus without incident.   Entry into the abdomen was obtained using direct view using an Optiview trocar technique. Verification of entry into the abdomen was obtained using opening pressures. The laparoscope was introduced and CO2 gas was infused for pneumoperitoneum to a pressure of 15 mm Hg.  Verification of atraumatic entry was obtained. A right 12-mm port and a left lateral 5-mm port and a 12 mm suprapubic port were placed under direct visualization of the laparoscope under direct intra-abdominal camera visualization.  The patient was placed in Trendelenburg and the bowel was displaced up into the upper abdomen.  Round ligaments were divided on each side with the EndoShears and the retroperitoneal space was opened  bilaterally.  The ureters were identified and preserved.  The utero-ovarian ligaments were skeletonized, sealed and divided with the LigaSure device.  A bladder flap was created and the bladder was dissected down off the lower uterine segment and cervix using electrocautery.  The uterine arteries were skeletonized bilaterally, sealed and divided with the LigaSure device.  A colpotomy was performed circumferentially along the V-Care ring with electrocautery and the cervix was incised from the vagina and the specimen was removed through the vagina.  A pneumo balloon was placed in the vagina and the vaginal cuff was then closed in a running continuous fashion using the EndoStitch technique with 0 V-Lock suture with careful attention to include the vaginal cuff angles and the vaginal mucosa within the closure.  Of note, the needle came off of the Endo Stitch after closure of half of the vaginal cuff. A second EndoStitch was used starting at the opposite cuff angle and met the first stitch such that full closure was obtained. Vaginal closure was verified using a single gloved hand in the vagina and no defects were noted.  The cystoscopy portion of the procedure was then performed. After removal of the Foley catheter a 70 cystoscope was introduced and the bladder. After filling the bladder with approximately 200 mL of sterile saline, the entire bladder was inspected and found to be intact. The ureteral orifices were visualized and efflux of urine was noted from each of the ureteral orifices. The bladder was then drained and the catheter was replaced to drain the bladder.  Hemostasis was observed. All planes of dissection, vascular pedicles and the vaginal cuff were found to  be hemostatic.  Arista 3 grams was placed along the pedicles to ensure continued hemostasis.  The suprapubic trocar was removed and the fascia was closed with 0 Vicryl suture using the Endoclose technique. The same procedure was carried out on the  right lower quadrant port.  The left lateral trocar was removed under visualization.   Before the umbilical trocar was removed the CO2 gas was released.  The skin at the suprapubic and right lower quadrant port was closed using a subcuticular stitch of 3-0 vicryl. All skin incisions were closed with surgical glue.  The patient tolerated the procedure well.  Sponge, lap and needle counts were correct x2.  The patient was taken to recovery room in excellent condition.  Antibiotics: She received 2 g of cefotetan and within 1 hour of skin incision.   VTE prophylaxis:  The patient was wearing pneumatic compression stockings are entire procedure  Will Bonnet, MD, [place] 12/27/2014 1:05 PM

## 2014-12-27 NOTE — Progress Notes (Signed)
Post op check  Subjective: pain well controlled on PO tylenol, ibuprofen, and oxycodone.  She requests to discontinue the ibuprofen given a recent history of a stomach ulcer. She is ambulating and voiding spontaneously. She has tolerated a bland diet so far. She had one episode several hours ago where she had nausea and severe pain. She received phenergan 12.5mg  and diluadid 1mg  IV.  She is now doing much better from both standpoints.  Objective: BP 113/61 mmHg  Pulse 90  Temp(Src) 98.8 F (37.1 C) (Oral)  Resp 18  Ht 5\' 9"  (1.753 m)  Wt 200 lb (90.719 kg)  BMI 29.52 kg/m2  SpO2 98%  LMP 12/14/2014  Gen: NAD CV: RRR Pulm: CTAB Abd: soft, approp ttp, +BS INC; All c/d/i Ext: SCDs in place  A/P: doing well postop from surgery Continue current plan of care Expect discharge tomorrow.  Will Bonnet, MD, [place] 12/27/2014 8:27 PM

## 2014-12-27 NOTE — Transfer of Care (Signed)
Immediate Anesthesia Transfer of Care Note  Patient: Misty Zamora  Procedure(s) Performed: Procedure(s): HYSTERECTOMY TOTAL LAPAROSCOPIC (N/A) BILATERAL SALPINGECTOMY (Bilateral) CYSTOSCOPY (N/A)  Patient Location: PACU  Anesthesia Type:General  Level of Consciousness: awake  Airway & Oxygen Therapy: Patient Spontanous Breathing and Patient connected to face mask oxygen  Post-op Assessment: Report given to RN and Post -op Vital signs reviewed and stable  Post vital signs: Reviewed and stable  Last Vitals:  Filed Vitals:   12/27/14 1300  BP: 113/64  Pulse: 78  Temp: 36.8 C  Resp: 13    Complications: No apparent anesthesia complications

## 2014-12-28 ENCOUNTER — Encounter: Payer: Self-pay | Admitting: Obstetrics and Gynecology

## 2014-12-28 DIAGNOSIS — G8929 Other chronic pain: Secondary | ICD-10-CM | POA: Diagnosis not present

## 2014-12-28 LAB — CBC
HCT: 32.1 % — ABNORMAL LOW (ref 35.0–47.0)
Hemoglobin: 10.2 g/dL — ABNORMAL LOW (ref 12.0–16.0)
MCH: 23.8 pg — AB (ref 26.0–34.0)
MCHC: 31.8 g/dL — AB (ref 32.0–36.0)
MCV: 74.9 fL — AB (ref 80.0–100.0)
Platelets: 217 10*3/uL (ref 150–440)
RBC: 4.29 MIL/uL (ref 3.80–5.20)
RDW: 23.4 % — ABNORMAL HIGH (ref 11.5–14.5)
WBC: 9.6 10*3/uL (ref 3.6–11.0)

## 2014-12-28 LAB — BASIC METABOLIC PANEL
Anion gap: 5 (ref 5–15)
BUN: 6 mg/dL (ref 6–20)
CALCIUM: 8.9 mg/dL (ref 8.9–10.3)
CO2: 31 mmol/L (ref 22–32)
CREATININE: 0.59 mg/dL (ref 0.44–1.00)
Chloride: 103 mmol/L (ref 101–111)
GFR calc Af Amer: >60 mL/min (ref >60)
Glucose, Bld: 82 mg/dL (ref 65–99)
POTASSIUM: 3.7 mmol/L (ref 3.5–5.1)
Sodium: 139 mmol/L (ref 135–145)

## 2014-12-28 MED ORDER — ACETAMINOPHEN 500 MG PO TABS: 1000.0000 mg | ORAL_TABLET | Freq: Four times a day (QID) | ORAL | Status: DC

## 2014-12-28 MED ORDER — DIPHENHYDRAMINE HCL 25 MG PO CAPS
25.0000 mg | ORAL_CAPSULE | Freq: Four times a day (QID) | ORAL | Status: DC | PRN
  Administered 2014-12-28: 25 mg via ORAL
  Filled 2014-12-28: qty 1

## 2014-12-28 MED ORDER — HYDROMORPHONE HCL 2 MG PO TABS
4.0000 mg | ORAL_TABLET | ORAL | Status: DC | PRN
  Administered 2014-12-28 (×2): 4 mg via ORAL
  Filled 2014-12-28 (×2): qty 2

## 2014-12-28 MED ORDER — DIPHENHYDRAMINE HCL 50 MG/ML IJ SOLN
25.0000 mg | Freq: Once | INTRAMUSCULAR | Status: AC
  Administered 2014-12-28: 25 mg via INTRAVENOUS
  Filled 2014-12-28: qty 1

## 2014-12-28 MED ORDER — HYDROMORPHONE HCL 2 MG PO TABS: 4.0000 mg | ORAL_TABLET | ORAL | Status: DC | PRN

## 2014-12-28 MED ORDER — PROMETHAZINE HCL 25 MG PO TABS: 25.0000 mg | ORAL_TABLET | Freq: Four times a day (QID) | ORAL | Status: DC | PRN

## 2014-12-28 MED ORDER — PROMETHAZINE HCL 25 MG PO TABS
25.0000 mg | ORAL_TABLET | Freq: Four times a day (QID) | ORAL | Status: DC | PRN
  Administered 2014-12-28: 25 mg via ORAL
  Filled 2014-12-28: qty 1

## 2014-12-28 MED ORDER — HYDROMORPHONE HCL 2 MG PO TABS: 2.0000 mg | ORAL_TABLET | ORAL | Status: DC | PRN

## 2014-12-28 NOTE — Discharge Summary (Signed)
Discharge Summary   Patient ID: Misty Zamora [id number] 39 y.o. December 29, 1975  Admit date: 12/27/2014  Discharge date: 12/28/2014  Principal Diagnoses:  1) chronic pelvic pain 2) menorrhagia  Secondary Diagnoses:  none  Procedures performed during the hospitalization:  1) total laparoscopic hysterectomy, bilateral salpingectomy 2) cystoscopy  HPI: The patient was admitted for a long history of pelvic pain for which she has undergone multiple workups and therapies.  Mutual decision made to proceed with total hysterectomy. See admission H&P for more details.  Past Medical History  Diagnosis Date  . Heart murmur 2005  . History of cardiovascular stress test 2005    showed MR and TR  . Anemia   . Hypertension   . Sleep apnea   . Asthma   . Depression   . Anxiety   . GERD (gastroesophageal reflux disease)   . PONV (postoperative nausea and vomiting)     hypotension with epidural    Past Surgical History  Procedure Laterality Date  . Gallbladder removed    . Laproscopy    . Colonoscopy  2010  . Renal endoscopy via nephrostomy / pyelostomy  2010  . Tubal ligation    . Gastric bypass  2012  . Dilation and curettage of uterus    . Elbow surgery Left   . Laparoscopic hysterectomy N/A 12/27/2014    Procedure: HYSTERECTOMY TOTAL LAPAROSCOPIC;  Surgeon: Will Bonnet, MD;  Location: [facility] ORS;  Service: Gynecology;  Laterality: N/A;  . Bilateral salpingectomy Bilateral 12/27/2014    Procedure: BILATERAL SALPINGECTOMY;  Surgeon: Will Bonnet, MD;  Location: [facility];  Service: Gynecology;  Laterality: Bilateral;  . Cystoscopy N/A 12/27/2014    Procedure: CYSTOSCOPY;  Surgeon: Will Bonnet, MD;  Location: [facility];  Service: Gynecology;  Laterality: N/A;   Allergies: No Known Allergies  History  Substance Use Topics  . Smoking status: Former Smoker -- 0.00 packs/day for 8 years    Types: Cigarettes    Quit date: 06/04/1999  . Smokeless tobacco: Not on file  .  Alcohol Use: Yes    Family History  Problem Relation Age of Onset  . Hypertension Mother   . Hyperlipidemia Mother   . Hyperlipidemia Father   . Hypertension Father   . Hypertension Sister   . Hypertension Brother     Hospital Course:  Admitted for above surgery.  Routine postoperative course, with some initial difficulty in getting her pain under control.  She did have difficulty with pain and itching and nausea with percocet.  She was switched to dilaudid and her itching and nausea resolved and she had better pain control. Her vitals and labs were normal throughout her hospital stay.  Discharge Exam: BP 110/67 mmHg  Pulse 78  Temp(Src) 97.9 F (36.6 C) (Oral)  Resp 18  Ht 5\' 9"  (1.753 m)  Wt 200 lb (90.719 kg)  BMI 29.52 kg/m2  SpO2 100%  LMP 12/14/2014 General  no apparent distress   CV  RRR   Pulmonary  clear to ausculatation bllaterally   Abdomen  Bowel sounds: present  Incisions: clean, dry, intact   Extremities  no edema, symmetric, SCDs in place    Condition at Discharge: Stable  Complications affecting treatment: None  Discharge Medications:    Medication List    STOP taking these medications        dicyclomine 20 MG tablet  Commonly known as:  BENTYL     ondansetron 4 MG tablet  Commonly known as:  ZOFRAN  oxyCODONE-acetaminophen 5-325 MG per tablet  Commonly known as:  ROXICET     polyethylene glycol packet  Commonly known as:  MIRALAX     simethicone 80 MG chewable tablet  Commonly known as:  GAS-X     traMADol 50 MG tablet  Commonly known as:  ULTRAM      TAKE these medications        acetaminophen 500 MG tablet  Commonly known as:  TYLENOL  Take 2 tablets (1,000 mg total) by mouth every 6 (six) hours.     clonazePAM 0.5 MG tablet  Commonly known as:  KLONOPIN  Take 0.5 mg by mouth daily as needed for anxiety.     ferrous sulfate 325 (65 FE) MG tablet  Take 325 mg by mouth daily with breakfast.     HYDROmorphone 2 MG tablet   Commonly known as:  DILAUDID  Take 2 tablets (4 mg total) by mouth every 4 (four) hours as needed for moderate pain or severe pain.     omeprazole 20 MG capsule  Commonly known as:  PRILOSEC  Take 20 mg by mouth 2 (two) times daily before a meal.     promethazine 25 MG tablet  Commonly known as:  PHENERGAN  Take 1 tablet (25 mg total) by mouth every 6 (six) hours as needed for nausea or vomiting.         Follow-up arrangements:  Follow-up Information    Follow up with Will Bonnet, MD In 10 days.   Specialty:  Obstetrics and Gynecology   Why:  wound check, if not already scheduled   Contact information:   [address] [place] [address] [phone]      Discharge Disposition: Home in stable condition  Signed: Will Bonnet 12/28/2014 3:04 PM

## 2014-12-28 NOTE — Progress Notes (Signed)
Patient discharged home. Discharge instructions, prescriptions and follow up appointment given to and reviewed with patient. Patient verbalized understanding. Escorted out by Perley Jain, RN.

## 2014-12-29 LAB — SURGICAL PATHOLOGY

## 2014-12-29 NOTE — Anesthesia Postprocedure Evaluation (Signed)
  Anesthesia Post-op Note  Patient: Misty Zamora  Procedure(s) Performed: Procedure(s): HYSTERECTOMY TOTAL LAPAROSCOPIC (N/A) BILATERAL SALPINGECTOMY (Bilateral) CYSTOSCOPY (N/A)  Anesthesia type:General ETT  Patient location: PACU  Post pain: Pain level controlled  Post assessment: Post-op Vital signs reviewed, Patient's Cardiovascular Status Stable, Respiratory Function Stable, Patent Airway and No signs of Nausea or vomiting  Post vital signs: Reviewed and stable  Last Vitals:  Filed Vitals:   12/28/14 1541  BP: 113/69  Pulse: 83  Temp: 36.5 C  Resp: 18    Level of consciousness: awake, alert  and patient cooperative  Complications: No apparent anesthesia complications

## 2015-01-02 DIAGNOSIS — E669 Obesity, unspecified: Secondary | ICD-10-CM | POA: Insufficient documentation

## 2015-01-02 DIAGNOSIS — E042 Nontoxic multinodular goiter: Secondary | ICD-10-CM | POA: Insufficient documentation

## 2015-01-02 DIAGNOSIS — N209 Urinary calculus, unspecified: Secondary | ICD-10-CM | POA: Insufficient documentation

## 2015-01-05 ENCOUNTER — Other Ambulatory Visit: Payer: Self-pay | Admitting: Family Medicine

## 2015-01-05 ENCOUNTER — Telehealth: Payer: Self-pay

## 2015-01-05 DIAGNOSIS — F329 Major depressive disorder, single episode, unspecified: Secondary | ICD-10-CM

## 2015-01-05 DIAGNOSIS — F32A Depression, unspecified: Secondary | ICD-10-CM

## 2015-01-05 MED ORDER — SERTRALINE HCL 50 MG PO TABS
50.0000 mg | ORAL_TABLET | Freq: Every day | ORAL | Status: DC
Start: 2015-01-05 — End: 2015-07-11

## 2015-01-05 NOTE — Telephone Encounter (Signed)
-----   Message from Carmon Ginsberg, [place] sent at 01/05/2015 12:33 PM EDT ----- Regarding: med refill Prescription for clonazepam is available up front for pickup.

## 2015-01-05 NOTE — Telephone Encounter (Signed)
Patient has been advised Rx is available for pick up, she also wants to know status of her Zoloft prescription request?

## 2015-01-05 NOTE — Telephone Encounter (Signed)
Sertraline sent in but would like to see her in 2-4 weeks

## 2015-01-05 NOTE — Telephone Encounter (Signed)
I have not received a request for Zoloft. At last office visit you stated you had quit taking it in the Fall of last year as you did not believe it was helping you.

## 2015-01-05 NOTE — Telephone Encounter (Signed)
Left message notifying patient that Rx has been sent to pharmacy and to follow up in office in 2-4 weeks

## 2015-01-05 NOTE — Telephone Encounter (Signed)
Patient states that yes she did make statement below, but she started taking her old Rx for Zoloft last week because she fell into a state of depression again.

## 2015-02-13 ENCOUNTER — Telehealth: Payer: Self-pay | Admitting: *Deleted

## 2015-02-13 NOTE — Telephone Encounter (Signed)
rcvd a staff message from [place] that pt does not want to have labs separate from the day she comes to see md and get venofer..  I called and got pt's voicemail and explained on the message that  In order to determine if pt needs the venofer and how much would depend on the labs that she has done.  The ferritin and iron levels are taking 2-3 hours before resulting out and we would not have enough time in 1 day to have labs, get results, see md and get iron due to how long it takes the iron levels to rtn with results.  I asked her to call me back and discuss this further.

## 2015-02-14 ENCOUNTER — Telehealth: Payer: Self-pay | Admitting: *Deleted

## 2015-02-14 NOTE — Telephone Encounter (Signed)
The patient says that she is working on the day of the labs 9/27 and then she can come 9/29 which is the day of the md visit and the venofer.  We are changing the appt to 9/29 labs and see md.  Then after we get levels of iron we can call her and arrange venofer for another time.  I have sent inbasket message to change the appts and pt is agreeable to above plan

## 2015-02-18 ENCOUNTER — Ambulatory Visit
Admission: RE | Admit: 2015-02-18 | Discharge: 2015-02-18 | Disposition: A | Discharge status: Home / Self Care | Payer: 59 | Source: Ambulatory Visit | Attending: Family Medicine | Admitting: Family Medicine

## 2015-02-18 ENCOUNTER — Encounter: Payer: Self-pay | Admitting: Family Medicine

## 2015-02-18 ENCOUNTER — Ambulatory Visit (INDEPENDENT_AMBULATORY_CARE_PROVIDER_SITE_OTHER): Payer: 59 | Admitting: Family Medicine

## 2015-02-18 VITALS — BP 108/82 | HR 84 | Temp 99.2°F | Resp 12 | Wt 204.0 lb

## 2015-02-18 DIAGNOSIS — M545 Low back pain, unspecified: Secondary | ICD-10-CM

## 2015-02-18 DIAGNOSIS — G4733 Obstructive sleep apnea (adult) (pediatric): Secondary | ICD-10-CM | POA: Insufficient documentation

## 2015-02-18 DIAGNOSIS — F419 Anxiety disorder, unspecified: Secondary | ICD-10-CM | POA: Insufficient documentation

## 2015-02-18 DIAGNOSIS — K589 Irritable bowel syndrome without diarrhea: Secondary | ICD-10-CM | POA: Insufficient documentation

## 2015-02-18 DIAGNOSIS — Z87442 Personal history of urinary calculi: Secondary | ICD-10-CM | POA: Insufficient documentation

## 2015-02-18 DIAGNOSIS — M5416 Radiculopathy, lumbar region: Secondary | ICD-10-CM | POA: Insufficient documentation

## 2015-02-18 DIAGNOSIS — I10 Essential (primary) hypertension: Secondary | ICD-10-CM | POA: Insufficient documentation

## 2015-02-18 DIAGNOSIS — D649 Anemia, unspecified: Secondary | ICD-10-CM | POA: Insufficient documentation

## 2015-02-18 DIAGNOSIS — J309 Allergic rhinitis, unspecified: Secondary | ICD-10-CM | POA: Insufficient documentation

## 2015-02-18 MED ORDER — KETOROLAC TROMETHAMINE 60 MG/2ML IM SOLN
60.0000 mg | Freq: Once | INTRAMUSCULAR | Status: AC
  Administered 2015-02-18: 60 mg via INTRAMUSCULAR

## 2015-02-18 MED ORDER — PREDNISONE 20 MG PO TABS: 20.0000 mg | ORAL_TABLET | Freq: Two times a day (BID) | ORAL | Status: AC

## 2015-02-18 NOTE — Progress Notes (Signed)
Patient ID: Misty Zamora, female   DOB: [birth year], 39 y.o.   MRN: [id number]       Patient: Misty Zamora Female    DOB: 07-12-1975   39 y.o.   MRN: [id number] Visit Date: 02/18/2015  Today's Provider: Lelon Huh, MD   Chief Complaint  Patient presents with  . Leg Pain  . Back Pain   Subjective:    HPI Patient states she has had pain in both of her legs that come intermittent for the past 2 months. Seems to bother her more at rest rather than with exertion. She denies numbness in her legs. She saw vein and vascular  Doctor about 1 week ago and they did [place] to check for blood clots and that was negative. They thought maybe her symptoms were coming from back but patient at that time was not having a back pain. Is schedule for more vascular studies on 02/28/2015  She does state that in the past 2 to 3 days she has had back pain. NO urinary symptoms. Her left leg hurt so bad the other day that she almost went to the hospital. She ended up taking Percocet 2 tablet which did not help the pain and then took 2 Aspirin tablets and then the pain subsided. She denies having any injuries to the area of pain. Was prescribed tramadol by vascular surgeon which helped slightly. She cannot take oral NSAIDs due to history gastric bypass.    Lab Results  Component Value Date   VITAMINB12 406 10/26/2014   CMP Latest Ref Rng 12/28/2014 12/21/2014 12/01/2014  Glucose 65 - 99 mg/dL 82 84 86  BUN 6 - 20 mg/dL 6 5(L) 7  Creatinine 0.44 - 1.00 mg/dL 0.59 0.69 0.59  Sodium 135 - 145 mmol/L 139 140 135  Potassium 3.5 - 5.1 mmol/L 3.7 3.8 4.3  Chloride 101 - 111 mmol/L 103 106 99(L)  CO2 22 - 32 mmol/L 31 30 29   Calcium 8.9 - 10.3 mg/dL 8.9 8.9 8.9  Total Protein 6.5 - 8.1 g/dL - 6.6 7.4  Total Bilirubin 0.3 - 1.2 mg/dL - 0.2(L) 0.3  Alkaline Phos 38 - 126 U/L - 44 57  AST 15 - 41 U/L - 18 17  ALT 14 - 54 U/L - 13(L) 13(L)    Lab Results  Component Value Date   WBC 9.6 12/28/2014   HGB 10.2*  12/28/2014   HCT 32.1* 12/28/2014   MCV 74.9* 12/28/2014   PLT 217 12/28/2014       No Known Allergies Previous Medications   ACETAMINOPHEN (TYLENOL) 500 MG TABLET    Take 2 tablets (1,000 mg total) by mouth every 6 (six) hours.   CLONAZEPAM (KLONOPIN) 0.5 MG TABLET    TAKE 1 TABLET BY MOUTH 2 TIMES DAILY AS NEEDED FOR ANXIETY   OMEPRAZOLE (PRILOSEC) 20 MG CAPSULE    Take 20 mg by mouth 2 (two) times daily before a meal.    SERTRALINE (ZOLOFT) 50 MG TABLET    Take 1 tablet (50 mg total) by mouth daily.    Review of Systems  Constitutional: Positive for fatigue.  Respiratory: Negative.   Cardiovascular: Negative.   Gastrointestinal: Positive for nausea.  Musculoskeletal: Positive for back pain and arthralgias.    Social History  Substance Use Topics  . Smoking status: Former Smoker -- 0.00 packs/day for 8 years    Types: Cigarettes    Quit date: 06/04/1999  . Smokeless tobacco: Never Used  . Alcohol Use: Yes  Objective:   BP 108/82 mmHg  Pulse 84  Temp(Src) 99.2 F (37.3 C)  Resp 12  Wt 204 lb (92.534 kg)  LMP 12/14/2014 (Exact Date)  Physical Exam   General Appearance:    Alert, cooperative, no distress  Eyes:    PERRL, conjunctiva/corneas clear, EOM's intact       Lungs:     Clear to auscultation bilaterally, respirations unlabored  Heart:    Regular rate and rhythm  Neurologic:   Awake, alert, oriented x 3. No apparent focal neurological           defect.   MS:  Tender lower lumbar spine and para-lumbar muscles. Negative straight leg test. +5/5 muscle strength        Assessment & Plan:     1. Low back pain potentially associated with radiculopathy  - DG Lumbar Spine Complete; Future - predniSONE (DELTASONE) 20 MG tablet; Take 1 tablet (20 mg total) by mouth 2 (two) times daily with a meal.  Dispense: 20 tablet; Refill: 0 - ketorolac (TORADOL) injection 60 mg; Inject 2 mLs (60 mg total) into the muscle once.       Lelon Huh, MD  [facility]

## 2015-02-20 ENCOUNTER — Telehealth: Payer: Self-pay | Admitting: Family Medicine

## 2015-02-20 DIAGNOSIS — M545 Low back pain, unspecified: Secondary | ICD-10-CM

## 2015-02-20 NOTE — Telephone Encounter (Signed)
-----   Message from Birdie Sons, MD sent at 02/19/2015  7:59 PM EDT ----- Misty Zamora of spine is normal Pain is likely due to sciatica. Should mostly resolve by the the she finishes prednisone. Call if not.

## 2015-02-20 NOTE — Telephone Encounter (Signed)
Pt saw Dr. Caryn Section on Saturday at had Xrays done. Pt stated she is still in a lot of pain and it is radiating down her legs. Pt wanted to see if she could get some for the pain and see if she needs an MRI. Pharmacy: [facility] [address] Thanks TNP

## 2015-02-20 NOTE — Telephone Encounter (Signed)
Patient notified of results. Patient stated that is not any better than when she was here for office visit.

## 2015-02-21 MED ORDER — TIZANIDINE HCL 4 MG PO TABS: 4.0000 mg | ORAL_TABLET | Freq: Four times a day (QID) | ORAL | Status: DC | PRN

## 2015-02-21 NOTE — Telephone Encounter (Signed)
   See note below,  Thanks,

## 2015-02-21 NOTE — Telephone Encounter (Signed)
Pt states she still hurting in her legs and back.  Pt is asking if there anything else that will help with the low back and leg pain. Pt is asking about having a MRI.  [facility].  [phone]    Pt states she seen Manuela Schwartz at the [facility] said pt really needed a MRI.  Manuela Schwartz also states she would not give her anything for pain due to pt extenisive DA history.  Pt was upset that Manuela Schwartz stated she had a extensive DA history without knowing why she was taking certain medication/MW

## 2015-02-21 NOTE — Telephone Encounter (Signed)
Will order MRI for radiculopathy. May send in rx for tizanidine 4mg  one every six hours as needed for back pain #30, rf x 0

## 2015-02-21 NOTE — Telephone Encounter (Signed)
Advised pt as directed below. Pt verbalized fully understanding.  Sent prescription to pharmacy as ordered per MD.  Thanks,

## 2015-02-22 ENCOUNTER — Telehealth: Payer: Self-pay | Admitting: Family Medicine

## 2015-02-22 ENCOUNTER — Ambulatory Visit
Admission: RE | Admit: 2015-02-22 | Discharge: 2015-02-22 | Disposition: A | Discharge status: Home / Self Care | Payer: 59 | Source: Ambulatory Visit | Attending: Family Medicine | Admitting: Family Medicine

## 2015-02-22 DIAGNOSIS — M545 Low back pain, unspecified: Secondary | ICD-10-CM

## 2015-02-22 DIAGNOSIS — M5127 Other intervertebral disc displacement, lumbosacral region: Secondary | ICD-10-CM | POA: Insufficient documentation

## 2015-02-22 DIAGNOSIS — M5126 Other intervertebral disc displacement, lumbar region: Secondary | ICD-10-CM | POA: Diagnosis not present

## 2015-02-22 MED ORDER — MELOXICAM 15 MG PO TABS: 15.0000 mg | ORAL_TABLET | Freq: Every day | ORAL | Status: DC

## 2015-02-22 NOTE — Telephone Encounter (Signed)
Rx sent to pharmacy   

## 2015-02-22 NOTE — Telephone Encounter (Signed)
Can send in rx for meloxicam 15mg  one a day, #30, rf x 0.

## 2015-02-22 NOTE — Telephone Encounter (Signed)
Pt said the tiZANidine (ZANAFLEX) 4 MG tablet made her feel very weird.. Pt states she don't think she can take this medication.  She also said it does not help with the pain. She said she does need something because she works 12 hr shift at [employer].    Her call back is   [phone]  Thanks Con Memos

## 2015-02-22 NOTE — Telephone Encounter (Signed)
Pleas advise.

## 2015-02-23 ENCOUNTER — Telehealth: Payer: Self-pay | Admitting: Family Medicine

## 2015-02-23 DIAGNOSIS — M5126 Other intervertebral disc displacement, lumbar region: Secondary | ICD-10-CM

## 2015-02-23 NOTE — Telephone Encounter (Signed)
Patient notified of results. Patient expressed understanding. Patient stated that she would like to get the hydrocodone rx to take when she is not working. Sent referral to neurosurgery.

## 2015-02-23 NOTE — Telephone Encounter (Signed)
Pt stated that she got a message on MyChart that her MRI results were back and wanted to get the results. I advised that Dr. Caryn Section is out of the office this afternoon. Pt wanted to know if he would review them from home or if someone else could look at them and call her with the results. Pt stated she is still in a lot of pain. Thanks TNP

## 2015-02-24 MED ORDER — HYDROCODONE-ACETAMINOPHEN 7.5-325 MG PO TABS: 1.0000 | ORAL_TABLET | Freq: Four times a day (QID) | ORAL | Status: DC | PRN

## 2015-02-28 ENCOUNTER — Other Ambulatory Visit: Payer: 59

## 2015-03-02 ENCOUNTER — Ambulatory Visit: Payer: 59 | Admitting: Internal Medicine

## 2015-03-02 ENCOUNTER — Ambulatory Visit: Payer: 59

## 2015-03-02 ENCOUNTER — Other Ambulatory Visit: Payer: 59

## 2015-03-03 ENCOUNTER — Inpatient Hospital Stay: Payer: 59

## 2015-03-03 ENCOUNTER — Other Ambulatory Visit: Payer: 59

## 2015-03-03 ENCOUNTER — Inpatient Hospital Stay: Payer: 59 | Attending: Internal Medicine

## 2015-03-03 ENCOUNTER — Inpatient Hospital Stay (HOSPITAL_BASED_OUTPATIENT_CLINIC_OR_DEPARTMENT_OTHER): Payer: 59 | Admitting: Internal Medicine

## 2015-03-03 VITALS — BP 119/83 | HR 67 | Temp 96.9°F | Resp 18 | Ht 69.0 in | Wt 202.8 lb

## 2015-03-03 DIAGNOSIS — Z79899 Other long term (current) drug therapy: Secondary | ICD-10-CM | POA: Diagnosis not present

## 2015-03-03 DIAGNOSIS — D509 Iron deficiency anemia, unspecified: Secondary | ICD-10-CM | POA: Diagnosis not present

## 2015-03-03 DIAGNOSIS — J45909 Unspecified asthma, uncomplicated: Secondary | ICD-10-CM | POA: Insufficient documentation

## 2015-03-03 DIAGNOSIS — K219 Gastro-esophageal reflux disease without esophagitis: Secondary | ICD-10-CM | POA: Insufficient documentation

## 2015-03-03 DIAGNOSIS — I10 Essential (primary) hypertension: Secondary | ICD-10-CM | POA: Insufficient documentation

## 2015-03-03 DIAGNOSIS — Z9884 Bariatric surgery status: Secondary | ICD-10-CM | POA: Insufficient documentation

## 2015-03-03 DIAGNOSIS — N920 Excessive and frequent menstruation with regular cycle: Secondary | ICD-10-CM | POA: Insufficient documentation

## 2015-03-03 DIAGNOSIS — Z87891 Personal history of nicotine dependence: Secondary | ICD-10-CM | POA: Insufficient documentation

## 2015-03-03 DIAGNOSIS — G4730 Sleep apnea, unspecified: Secondary | ICD-10-CM | POA: Insufficient documentation

## 2015-03-03 DIAGNOSIS — F419 Anxiety disorder, unspecified: Secondary | ICD-10-CM | POA: Insufficient documentation

## 2015-03-03 DIAGNOSIS — F329 Major depressive disorder, single episode, unspecified: Secondary | ICD-10-CM | POA: Insufficient documentation

## 2015-03-03 LAB — CBC WITH DIFFERENTIAL/PLATELET
BASOS ABS: 0 10*3/uL (ref 0–0.1)
BASOS PCT: 1 %
EOS PCT: 2 %
Eosinophils Absolute: 0.1 10*3/uL (ref 0–0.7)
HCT: 39 % (ref 35.0–47.0)
Hemoglobin: 13 g/dL (ref 12.0–16.0)
Lymphocytes Relative: 27 %
Lymphs Abs: 1.6 10*3/uL (ref 1.0–3.6)
MCH: 27.1 pg (ref 26.0–34.0)
MCHC: 33.4 g/dL (ref 32.0–36.0)
MCV: 81 fL (ref 80.0–100.0)
Monocytes Absolute: 0.3 10*3/uL (ref 0.2–0.9)
Monocytes Relative: 5 %
Neutro Abs: 3.7 10*3/uL (ref 1.4–6.5)
Neutrophils Relative %: 65 %
PLATELETS: 158 10*3/uL (ref 150–440)
RBC: 4.81 MIL/uL (ref 3.80–5.20)
RDW: 18.8 % — ABNORMAL HIGH (ref 11.5–14.5)
WBC: 5.7 10*3/uL (ref 3.6–11.0)

## 2015-03-03 LAB — IRON AND TIBC
IRON: 79 ug/dL (ref 28–170)
SATURATION RATIOS: 18 % (ref 10.4–31.8)
TIBC: 434 ug/dL (ref 250–450)
UIBC: 355 ug/dL

## 2015-03-03 LAB — FERRITIN: Ferritin: 14 ng/mL (ref 11–307)

## 2015-03-03 MED ORDER — SODIUM CHLORIDE 0.9 % IV SOLN
100.0000 mg | Freq: Once | INTRAVENOUS | Status: AC
Start: 2015-03-03 — End: 2015-03-03
  Administered 2015-03-03: 100 mg via INTRAVENOUS
  Filled 2015-03-03: qty 5

## 2015-03-03 MED ORDER — SODIUM CHLORIDE 0.9 % IV SOLN
INTRAVENOUS | Status: DC
  Administered 2015-03-03: 15:00:00 via INTRAVENOUS
  Filled 2015-03-03: qty 1000

## 2015-03-07 ENCOUNTER — Other Ambulatory Visit: Payer: Self-pay | Admitting: Surgery

## 2015-03-07 DIAGNOSIS — S46912S Strain of unspecified muscle, fascia and tendon at shoulder and upper arm level, left arm, sequela: Secondary | ICD-10-CM

## 2015-03-12 NOTE — Progress Notes (Signed)
Misty Zamora  [phone] [phone]     ID: Misty Zamora OB: June 28, 1975  MR#: [id number]  FHQ#:[id number]  Patient Care Team: Margo Common, PA as PCP - General (Physician Assistant)  CHIEF COMPLAINT/DIAGNOSIS:  Persistent Iron deficiency Anemia  -  S/p gastric bypass surgery 2012. History of heavy menstrual bleeding. Unresponsive to oral iron therapy, along with difficulty tolerating oral iron.  (10/26/14 - Serum iron 23, TIBC 595, ferritin 3, iron saturation 4%. Folate 7.6 and B12 is 406 which are normal range.   11/06/14 - hemoglobin 9.5, WBC 6500, hematocrit 30.2, creatinine 0.62, calcium 8.8, LFT unremarkable, albumin 3.4).  -  Received parenteral iron therapy with IV Venofer 500 mg 2 doses July 2016.    HISTORY OF PRESENT ILLNESS:  Patient returns for continued hematology follow-up. She recently received IV Venofer. States that this has made her feel much better, fatigue has improved. She denies any dyspnea on exertion, chest pain, palpitation, orthopnea or PND. States that her heavy menstrual periods is doing better. Denies any bright red blood in stools, melena or hematuria. No new bone pains.  Appetite is steady.  REVIEW OF SYSTEMS:   ROS As in HPI above. In addition, no fevers or sweats. No new headaches or focal weakness.  No sore throat, cough, shortness of breath, sputum, hemoptysis or chest pain. No abdominal pain, constipation, diarrhea, dysuria or hematuria. No new paresthesias in extremities.  PAST MEDICAL HISTORY: Reviewed. Past Medical History  Diagnosis Date  . Heart murmur 2005  . History of cardiovascular stress test 2005    showed MR and TR  . Anemia   . Hypertension   . Sleep apnea   . Asthma   . Depression   . Anxiety   . GERD (gastroesophageal reflux disease)   . 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  PAST SURGICAL HISTORY: Reviewed. Past Surgical History  Procedure Laterality Date  . Gallbladder removed    . Laproscopy    . Colonoscopy  2010  . Renal endoscopy via nephrostomy / pyelostomy  2010  . Tubal ligation    . Gastric bypass  2012  . Dilation and curettage of uterus    . Elbow surgery Left   . Laparoscopic hysterectomy N/A 12/27/2014    Procedure: HYSTERECTOMY TOTAL LAPAROSCOPIC;  Surgeon: Will Bonnet, MD;  Location: [facility] ORS;  Service: Gynecology;  Laterality: N/A;  . Bilateral salpingectomy Bilateral 12/27/2014    Procedure: BILATERAL SALPINGECTOMY;  Surgeon: Will Bonnet, MD;  Location: [facility];  Service: Gynecology;  Laterality: Bilateral;  . Cystoscopy N/A 12/27/2014    Procedure: CYSTOSCOPY;  Surgeon: Will Bonnet, MD;  Location: [facility] ORS;  Service: Gynecology;  Laterality: N/A;    FAMILY HISTORY: Reviewed. Family History  Problem Relation Age of Onset  . Hypertension Mother   . Hyperlipidemia Mother   . Diabetes Mother   . Cancer Mother     breast cancer; 04/2011  . Colon polyps Mother   . Hyperlipidemia Father   . Hypertension Father   . Colon polyps Father   . Hypertension Sister   . Hypertension Brother   . Cancer Maternal Uncle     terminal lung cancer    SOCIAL HISTORY: Reviewed. Social History  Substance Use Topics  . Smoking status: Former Smoker -- 0.00 packs/day for 8 years    Types: Cigarettes  Quit date: 06/04/1999  . Smokeless tobacco: Never Used  . Alcohol Use: Yes    No Known Allergies  Current Outpatient Prescriptions  Medication Sig Dispense Refill  . acetaminophen (TYLENOL) 500 MG tablet Take 2 tablets (1,000 mg total) by mouth every 6 (six) hours. 30 tablet 0  . clonazePAM (KLONOPIN) 0.5 MG tablet TAKE 1 TABLET BY MOUTH 2 TIMES DAILY AS NEEDED FOR ANXIETY 60 tablet 2  . HYDROcodone-acetaminophen (NORCO) 7.5-325 MG per tablet Take 1 tablet by mouth every 6 (six) hours as needed for moderate pain. 30  tablet 0  . meloxicam (MOBIC) 15 MG tablet Take 1 tablet (15 mg total) by mouth daily. 30 tablet 0  . omeprazole (PRILOSEC) 20 MG capsule Take 20 mg by mouth 2 (two) times daily before a meal.     . sertraline (ZOLOFT) 50 MG tablet Take 1 tablet (50 mg total) by mouth daily. 30 tablet 3  . tiZANidine (ZANAFLEX) 4 MG tablet Take 1 tablet (4 mg total) by mouth every 6 (six) hours as needed for muscle spasms (for Back pain). 30 tablet 0   No current facility-administered medications for this visit.    PHYSICAL EXAM: Filed Vitals:   03/03/15 1339  BP: 119/83  Pulse: 67  Temp: 96.9 F (36.1 C)  Resp: 18     Body mass index is 29.94 kg/(m^2).       GENERAL: Patient is alert and oriented and in no acute distress. No icterus or pallor. CVS: S1S2, regular LUNGS: Bilaterally clear to auscultation, no rhonchi. ABDOMEN: Soft, nontender.   EXTREMITIES: No pedal edema.   LAB RESULTS:  Lab Results  Component Value Date   WBC 5.7 03/03/2015   NEUTROABS 3.7 03/03/2015   HGB 13.0 03/03/2015   HCT 39.0 03/03/2015   MCV 81.0 03/03/2015   PLT 158 03/03/2015   9/30 - ferritin 14, iron saturation 18%, serum iron 79, TIBC 434, hemoglobin 13.0   STUDIES: 10/26/14 - Serum iron 23, TIBC 595, ferritin 3, iron saturation 4%. Folate 7.6 and B12 is 406 which are normal range. 11/06/14 - hemoglobin 9.5, WBC 6500, hematocrit 30.2, creatinine 0.62, calcium 8.8, LFT unremarkable, albumin 3.4.   ASSESSMENT / PLAN:   Persistent Iron Deficiency Anemia  -  S/p gastric bypass surgery 2012. History of heavy menstrual bleeding. Unresponsive to oral iron therapy (on  OTC iron tablet twice daily for 3-4 months now) along with difficulty tolerating oral iron.  (10/26/14 - Serum iron 23, TIBC 595, ferritin 3, iron saturation 4%. Folate 7.6 and B12 is 406 which are normal range.   11/06/14 - hemoglobin 9.5, WBC 6500, hematocrit 30.2, creatinine 0.62, calcium 8.8, LFT unremarkable, albumin 3.4). Received parenteral  iron therapy with IV Venofer 500 mg x 2 doses in July 2016  -   reviewed labs from today and discussed with patient. Hemoglobin has normalized. Serum iron and iron saturation at also in the normal range, ferritin is improved to the low normal range at 14. Have discussed further options with patient including pursuing maintenance parenteral iron therapy since she is s/p gastric bypass surgery and is unlikely to adequately absorb oral iron, versus continued monitoring and resuming IV iron if she develops recurrent iron deficiency. Patient prefers first option, plan therefore is to continue maintenance IV Venofer 100 mg once every 12 weeks. Will continue to monitor hemoglobin and iron study every 12 weeks. Next MD follow-up at 48 weeks with repeat labs and make continued treatment planning.     In between  visits, the patient has been advised to call or come to the ER in case of fevers, chills, bleeding, acute sickness, or new symptoms. Patient is agreeable to this plan.      Leia Alf, MD   03/12/2015 4:35 PM

## 2015-03-13 ENCOUNTER — Ambulatory Visit
Admission: RE | Admit: 2015-03-13 | Discharge: 2015-03-13 | Disposition: A | Discharge status: Home / Self Care | Payer: 59 | Source: Ambulatory Visit | Attending: Surgery | Admitting: Surgery

## 2015-03-13 DIAGNOSIS — M25522 Pain in left elbow: Secondary | ICD-10-CM | POA: Insufficient documentation

## 2015-03-13 DIAGNOSIS — S46912S Strain of unspecified muscle, fascia and tendon at shoulder and upper arm level, left arm, sequela: Secondary | ICD-10-CM

## 2015-03-13 MED ORDER — IOHEXOL 180 MG/ML  SOLN: 5.0000 mL | Freq: Once | INTRAMUSCULAR | Status: DC | PRN

## 2015-03-13 MED ORDER — GADOBENATE DIMEGLUMINE 529 MG/ML IV SOLN: 0.1000 mL | Freq: Once | INTRAVENOUS | Status: DC | PRN

## 2015-03-14 ENCOUNTER — Other Ambulatory Visit: Payer: Self-pay | Admitting: Family Medicine

## 2015-03-14 ENCOUNTER — Telehealth: Payer: Self-pay | Admitting: Family Medicine

## 2015-03-14 MED ORDER — HYDROCODONE-ACETAMINOPHEN 7.5-325 MG PO TABS: 1.0000 | ORAL_TABLET | Freq: Four times a day (QID) | ORAL | Status: DC | PRN

## 2015-03-14 NOTE — Telephone Encounter (Signed)
rx printed and ready to pick up.

## 2015-03-14 NOTE — Telephone Encounter (Signed)
Pt states she has seen the neurosurgeon and he gave her gabapentin and it has not helped.  He also scheduled her for an injection but she does not know the date yet.  Marland Kitchen  She wants to know if you can refill the pain med you prescribed for her last time because it helped.  Her call back is [phone]  Thanks, Con Memos

## 2015-03-20 DIAGNOSIS — M6750 Plica syndrome, unspecified knee: Secondary | ICD-10-CM | POA: Insufficient documentation

## 2015-03-21 ENCOUNTER — Other Ambulatory Visit: Payer: Self-pay | Admitting: Family Medicine

## 2015-03-22 ENCOUNTER — Encounter: Payer: Self-pay | Admitting: *Deleted

## 2015-03-30 ENCOUNTER — Encounter
Admission: RE | Disposition: A | Discharge status: Home / Self Care | Payer: Self-pay | Source: Ambulatory Visit | Attending: Surgery

## 2015-03-30 ENCOUNTER — Encounter: Payer: Self-pay | Admitting: *Deleted

## 2015-03-30 ENCOUNTER — Ambulatory Visit: Payer: 59 | Admitting: Student in an Organized Health Care Education/Training Program

## 2015-03-30 ENCOUNTER — Ambulatory Visit
Admission: RE | Admit: 2015-03-30 | Discharge: 2015-03-30 | Disposition: A | Discharge status: Home / Self Care | Payer: 59 | Source: Ambulatory Visit | Attending: Surgery | Admitting: Surgery

## 2015-03-30 DIAGNOSIS — Z8371 Family history of colonic polyps: Secondary | ICD-10-CM | POA: Insufficient documentation

## 2015-03-30 DIAGNOSIS — K219 Gastro-esophageal reflux disease without esophagitis: Secondary | ICD-10-CM | POA: Diagnosis not present

## 2015-03-30 DIAGNOSIS — Z8249 Family history of ischemic heart disease and other diseases of the circulatory system: Secondary | ICD-10-CM | POA: Insufficient documentation

## 2015-03-30 DIAGNOSIS — Z9049 Acquired absence of other specified parts of digestive tract: Secondary | ICD-10-CM | POA: Diagnosis not present

## 2015-03-30 DIAGNOSIS — Z7982 Long term (current) use of aspirin: Secondary | ICD-10-CM | POA: Diagnosis not present

## 2015-03-30 DIAGNOSIS — I10 Essential (primary) hypertension: Secondary | ICD-10-CM | POA: Insufficient documentation

## 2015-03-30 DIAGNOSIS — G4730 Sleep apnea, unspecified: Secondary | ICD-10-CM | POA: Diagnosis not present

## 2015-03-30 DIAGNOSIS — M67222 Synovial hypertrophy, not elsewhere classified, left upper arm: Secondary | ICD-10-CM | POA: Insufficient documentation

## 2015-03-30 DIAGNOSIS — Z87442 Personal history of urinary calculi: Secondary | ICD-10-CM | POA: Diagnosis not present

## 2015-03-30 DIAGNOSIS — E049 Nontoxic goiter, unspecified: Secondary | ICD-10-CM | POA: Insufficient documentation

## 2015-03-30 DIAGNOSIS — Z809 Family history of malignant neoplasm, unspecified: Secondary | ICD-10-CM | POA: Insufficient documentation

## 2015-03-30 DIAGNOSIS — K760 Fatty (change of) liver, not elsewhere classified: Secondary | ICD-10-CM | POA: Diagnosis not present

## 2015-03-30 DIAGNOSIS — J45909 Unspecified asthma, uncomplicated: Secondary | ICD-10-CM | POA: Diagnosis not present

## 2015-03-30 DIAGNOSIS — Z87891 Personal history of nicotine dependence: Secondary | ICD-10-CM | POA: Insufficient documentation

## 2015-03-30 DIAGNOSIS — E669 Obesity, unspecified: Secondary | ICD-10-CM | POA: Insufficient documentation

## 2015-03-30 DIAGNOSIS — Z79899 Other long term (current) drug therapy: Secondary | ICD-10-CM | POA: Insufficient documentation

## 2015-03-30 DIAGNOSIS — Z833 Family history of diabetes mellitus: Secondary | ICD-10-CM | POA: Insufficient documentation

## 2015-03-30 HISTORY — DX: Other intervertebral disc displacement, lumbar region: M51.26

## 2015-03-30 HISTORY — DX: Other cervical disc degeneration, unspecified cervical region: M50.30

## 2015-03-30 HISTORY — DX: Personal history of urinary calculi: Z87.442

## 2015-03-30 HISTORY — DX: Other cervical disc displacement, unspecified cervical region: M50.20

## 2015-03-30 HISTORY — DX: Presence of spectacles and contact lenses: Z97.3

## 2015-03-30 HISTORY — DX: Other intervertebral disc degeneration, lumbar region without mention of lumbar back pain or lower extremity pain: M51.369

## 2015-03-30 HISTORY — PX: ELBOW ARTHROSCOPY: SHX614

## 2015-03-30 HISTORY — DX: Other intervertebral disc degeneration, lumbar region: M51.36

## 2015-03-30 SURGERY — ARTHROSCOPY, ELBOW, WITH OPEN SURGERY IF INDICATED
Anesthesia: Regional | Site: Elbow | Laterality: Left | Wound class: Clean

## 2015-03-30 MED ORDER — KETOROLAC TROMETHAMINE 30 MG/ML IJ SOLN
30.0000 mg | Freq: Once | INTRAMUSCULAR | Status: AC
  Administered 2015-03-30: 30 mg via INTRAVENOUS

## 2015-03-30 MED ORDER — BUPIVACAINE HCL (PF) 0.5 % IJ SOLN: INTRAMUSCULAR | Status: DC | PRN

## 2015-03-30 MED ORDER — METOCLOPRAMIDE HCL 5 MG PO TABS: 5.0000 mg | ORAL_TABLET | Freq: Three times a day (TID) | ORAL | Status: DC | PRN

## 2015-03-30 MED ORDER — FENTANYL CITRATE (PF) 100 MCG/2ML IJ SOLN
INTRAMUSCULAR | Status: DC | PRN
  Administered 2015-03-30: 100 ug via INTRAVENOUS

## 2015-03-30 MED ORDER — METOCLOPRAMIDE HCL 5 MG/ML IJ SOLN: 5.0000 mg | Freq: Three times a day (TID) | INTRAMUSCULAR | Status: DC | PRN

## 2015-03-30 MED ORDER — MIDAZOLAM HCL 2 MG/2ML IJ SOLN
INTRAMUSCULAR | Status: DC | PRN
  Administered 2015-03-30 (×2): 2 mg via INTRAVENOUS

## 2015-03-30 MED ORDER — ONDANSETRON HCL 4 MG/2ML IJ SOLN: 4.0000 mg | Freq: Four times a day (QID) | INTRAMUSCULAR | Status: DC | PRN

## 2015-03-30 MED ORDER — OXYCODONE HCL 5 MG PO TABS: 5.0000 mg | ORAL_TABLET | ORAL | Status: DC | PRN

## 2015-03-30 MED ORDER — FENTANYL CITRATE (PF) 100 MCG/2ML IJ SOLN
25.0000 ug | INTRAMUSCULAR | Status: DC | PRN
  Administered 2015-03-30 (×2): 25 ug via INTRAVENOUS

## 2015-03-30 MED ORDER — OXYCODONE HCL 5 MG PO TABS
5.0000 mg | ORAL_TABLET | ORAL | Status: DC | PRN
  Administered 2015-03-30: 5 mg via ORAL

## 2015-03-30 MED ORDER — CEFAZOLIN SODIUM-DEXTROSE 2-3 GM-% IV SOLR
2.0000 g | Freq: Once | INTRAVENOUS | Status: AC
  Administered 2015-03-30: 2 g via INTRAVENOUS

## 2015-03-30 MED ORDER — SCOPOLAMINE 1 MG/3DAYS TD PT72
1.0000 | MEDICATED_PATCH | Freq: Once | TRANSDERMAL | Status: DC
  Administered 2015-03-30: 1.5 mg via TRANSDERMAL

## 2015-03-30 MED ORDER — PROPOFOL 10 MG/ML IV BOLUS
INTRAVENOUS | Status: DC | PRN
  Administered 2015-03-30: 150 mg via INTRAVENOUS

## 2015-03-30 MED ORDER — ONDANSETRON HCL 4 MG/2ML IJ SOLN: 4.0000 mg | Freq: Once | INTRAMUSCULAR | Status: DC | PRN

## 2015-03-30 MED ORDER — LIDOCAINE HCL (CARDIAC) 20 MG/ML IV SOLN
INTRAVENOUS | Status: DC | PRN
  Administered 2015-03-30: 50 mg via INTRATRACHEAL

## 2015-03-30 MED ORDER — LACTATED RINGERS IV SOLN
INTRAVENOUS | Status: DC
  Administered 2015-03-30: 10:00:00 via INTRAVENOUS

## 2015-03-30 MED ORDER — LIDOCAINE HCL 1 % IJ SOLN
INTRAMUSCULAR | Status: DC | PRN
  Administered 2015-03-30: 20 mL

## 2015-03-30 MED ORDER — GLYCOPYRROLATE 0.2 MG/ML IJ SOLN
INTRAMUSCULAR | Status: DC | PRN
  Administered 2015-03-30: 0.1 mg via INTRAVENOUS

## 2015-03-30 MED ORDER — POTASSIUM CHLORIDE IN NACL 20-0.9 MEQ/L-% IV SOLN: INTRAVENOUS | Status: DC

## 2015-03-30 MED ORDER — ONDANSETRON HCL 4 MG PO TABS: 4.0000 mg | ORAL_TABLET | Freq: Four times a day (QID) | ORAL | Status: DC | PRN

## 2015-03-30 MED ORDER — ONDANSETRON HCL 4 MG/2ML IJ SOLN
INTRAMUSCULAR | Status: DC | PRN
  Administered 2015-03-30: 4 mg via INTRAVENOUS

## 2015-03-30 MED ORDER — ROPIVACAINE HCL 5 MG/ML IJ SOLN
INTRAMUSCULAR | Status: DC | PRN
  Administered 2015-03-30: 35 mL via PERINEURAL

## 2015-03-30 MED ORDER — DEXAMETHASONE SODIUM PHOSPHATE 4 MG/ML IJ SOLN
INTRAMUSCULAR | Status: DC | PRN
  Administered 2015-03-30: 4 mg via PERINEURAL
  Administered 2015-03-30: 4 mg via INTRAVENOUS

## 2015-03-30 SURGICAL SUPPLY — 29 items
BANDAGE ELASTIC 2 VELCRO NS LF (GAUZE/BANDAGES/DRESSINGS) IMPLANT
BANDAGE ELASTIC 3 VELCRO NS (GAUZE/BANDAGES/DRESSINGS) IMPLANT
BANDAGE ELASTIC 4 VELCRO NS (GAUZE/BANDAGES/DRESSINGS) ×2 IMPLANT
BLADE EAR TYMPAN 2.5 60D BEAV (BLADE) ×2 IMPLANT
BNDG COHESIVE 4X5 TAN STRL (GAUZE/BANDAGES/DRESSINGS) ×2 IMPLANT
BNDG ESMARK 4X12 TAN STRL LF (GAUZE/BANDAGES/DRESSINGS) ×2 IMPLANT
CHLORAPREP W/TINT 26ML (MISCELLANEOUS) ×2 IMPLANT
CLEAR TRAC DISPOSABLE SCOPE/ SHAVER CANNULA ×2 IMPLANT
CORD BIP STRL DISP 12FT (MISCELLANEOUS) IMPLANT
COVER LIGHT HANDLE FLEXIBLE (MISCELLANEOUS) ×4 IMPLANT
CUFF TOURN SGL QUICK 18 (TOURNIQUET CUFF) ×4 IMPLANT
GAUZE PETRO XEROFOAM 1X8 (MISCELLANEOUS) ×2 IMPLANT
GAUZE SPONGE 4X4 12PLY STRL (GAUZE/BANDAGES/DRESSINGS) ×2 IMPLANT
GAUZE STRETCH 2X75IN STRL (MISCELLANEOUS) IMPLANT
GLOVE BIO SURGEON STRL SZ8 (GLOVE) ×6 IMPLANT
GLOVE INDICATOR 8.0 STRL GRN (GLOVE) ×4 IMPLANT
GOWN STRL REUS W/ TWL LRG LVL3 (GOWN DISPOSABLE) ×1 IMPLANT
GOWN STRL REUS W/ TWL XL LVL3 (GOWN DISPOSABLE) ×1 IMPLANT
GOWN STRL REUS W/TWL LRG LVL3 (GOWN DISPOSABLE) ×1
GOWN STRL REUS W/TWL XL LVL3 (GOWN DISPOSABLE) ×1
NS IRRIG 500ML POUR BTL (IV SOLUTION) ×2 IMPLANT
PACK EXTREMITY ARMC (MISCELLANEOUS) ×2 IMPLANT
PAD GROUND ADULT SPLIT (MISCELLANEOUS) ×2 IMPLANT
SLING ARM LRG DEEP (SOFTGOODS) ×2 IMPLANT
STOCKINETTE IMPERVIOUS LG (DRAPES) ×2 IMPLANT
STRAP BODY AND KNEE 60X3 (MISCELLANEOUS) ×6 IMPLANT
SUT PROLENE 4 0 PS 2 18 (SUTURE) ×4 IMPLANT
SUT VIC AB 3-0 SH 27 (SUTURE) ×1
SUT VIC AB 3-0 SH 27X BRD (SUTURE) ×1 IMPLANT

## 2015-03-30 NOTE — Progress Notes (Signed)
Assisted Mike Stella ANMD with left, ultrasound guided, supraclavicular block. Side rails up, monitors on throughout procedure. See vital signs in flow sheet. Tolerated Procedure well.  

## 2015-03-30 NOTE — H&P (Signed)
Paper H&P to be scanned into permanent record. H&P reviewed. No changes. 

## 2015-03-30 NOTE — Anesthesia Preprocedure Evaluation (Signed)
Anesthesia Evaluation  Patient identified by MRN, date of birth, ID band  Reviewed: Allergy & Precautions, H&P , NPO status , Patient's Chart, lab work & pertinent test results  History of Anesthesia Complications (+) PONV and history of anesthetic complications  Airway Mallampati: I  TM Distance: >3 FB Neck ROM: full    Dental no notable dental hx.    Pulmonary asthma , sleep apnea , former smoker,    Pulmonary exam normal        Cardiovascular hypertension,  Rhythm:regular Rate:Normal     Neuro/Psych PSYCHIATRIC DISORDERS    GI/Hepatic GERD  ,  Endo/Other    Renal/GU      Musculoskeletal   Abdominal   Peds  Hematology   Anesthesia Other Findings   Reproductive/Obstetrics                             Anesthesia Physical Anesthesia Plan  ASA: II  Anesthesia Plan: Regional and General LMA   Post-op Pain Management: GA combined w/ Regional for post-op pain   Induction:   Airway Management Planned:   Additional Equipment:   Intra-op Plan:   Post-operative Plan:   Informed Consent: I have reviewed the patients History and Physical, chart, labs and discussed the procedure including the risks, benefits and alternatives for the proposed anesthesia with the patient or authorized representative who has indicated his/her understanding and acceptance.     Plan Discussed with: CRNA  Anesthesia Plan Comments:         Anesthesia Quick Evaluation

## 2015-03-30 NOTE — Anesthesia Postprocedure Evaluation (Signed)
  Anesthesia Post-op Note  Patient: Misty Zamora  Procedure(s) Performed: Procedure(s): ARTHROSCOPY ELBOW WITH DEBRIDEMENT OF THE SYMPTOMATIC PLICA (Left)  Anesthesia type:Regional, General LMA  Patient location: PACU  Post pain: Pain level controlled  Post assessment: Post-op Vital signs reviewed, Patient's Cardiovascular Status Stable, Respiratory Function Stable, Patent Airway and No signs of Nausea or vomiting  Post vital signs: Reviewed and stable  Last Vitals:  Filed Vitals:   03/30/15 1345  BP: 139/92  Pulse: 88  Temp:   Resp: 14    Level of consciousness: awake, alert  and patient cooperative  Complications: No apparent anesthesia complications

## 2015-03-30 NOTE — Transfer of Care (Signed)
Immediate Anesthesia Transfer of Care Note  Patient: Misty Zamora  Procedure(s) Performed: Procedure(s): ARTHROSCOPY ELBOW WITH DEBRIDEMENT OF THE SYMPTOMATIC PLICA (Left)  Patient Location: PACU  Anesthesia Type: Regional, General LMA  Level of Consciousness: awake, alert  and patient cooperative  Airway and Oxygen Therapy: Patient Spontanous Breathing and Patient connected to supplemental oxygen  Post-op Assessment: Post-op Vital signs reviewed, Patient's Cardiovascular Status Stable, Respiratory Function Stable, Patent Airway and No signs of Nausea or vomiting  Post-op Vital Signs: Reviewed and stable  Complications: No apparent anesthesia complications

## 2015-03-30 NOTE — Op Note (Addendum)
03/30/2015  1:17 PM  Patient:   Misty Zamora  Pre-Op Diagnosis:   Symptomatic posterolateral plica, left elbow.  Post-Op Diagnosis:   Same.  Procedure:   Arthroscopic debridement of symptomatic posterolateral plica, left elbow.  Surgeon:   Pascal Lux, MD  Assistant:   None  Anesthesia:   General laryngomask anesthesia with an interscalene block placed preoperatively by the anesthesiologist.  Findings:   As above. The articular surfaces of the distal humerus, the radial head, and the ulna all were in excellent condition. No significant synovitis was noted.  Complications:   None  EBL:   2 cc  Fluids:   850 cc crystalloid  TT:   51 minutes at 250 mmHg  Drains:   None  Closure:   4-0 Prolene interrupted sutures  IBrief Clinical Note:   The patient is a 39 year old female who is now 9 months status post arthroscopic debridement of her left elbow for painful catching. The patient did well for proximal to 6 months before her symptoms recurred. A subsequent MRI scan has demonstrated the presence of a posterior lateral plica affecting the area near the radial head. She presents at this time for a repeat left elbow arthroscopy with debridement of the recurrent/persistent symptomatic posterolateral plica.  Procedure:   The patient underwent placement of an interscalene block in the preoperative holding area by anesthesia before she was brought into the operating room and lain in the supine position. After adequate general laryngal mask anesthesia was obtained, the patient was repositioned in the right lateral decubitus position and secured using a beanbag. Care was taken to be sure that all of the bony prominences were well padded and that an axillary roll was used. A tourniquet was placed around the upper arm before the arm was secured to the arm holder using a 4 inch Coban. After verifying the appropriate surgical site with a timeout, a mixture of 15 cc of 0.5% Sensorcaine and 15  cc of 1% lidocaine with epinephrine was injected into the left elbow joint using sterile technique. The left upper extremity was prepped with ChloraPrep solution before being draped sterilely. Preoperative antibiotics were administered. The limb was exsanguinated with an Esmarch and the tourniquet inflated to 250 mmHg. A proximal anteromedial portal was created using an outside-in technique at a spot 2 cm proximal and 1 cm anterior to the medial epicondyle. The skin was incised with a #15 blade before blunt dissection was carried down through the subcutaneous tissues using a hemostat. The intra-articular aspect of the elbow was carefully inspected with the findings as described above. A separate anterolateral portal was created 1 cm proximal and 1 center anterior to the lateral epicondyle using an outside-in technique. A thickened plica was identified over the anterior and lateral aspects of the radial head this was debrided back to to stable margins using the 3.5 mm full-radius resector. An end-cutting arthroscopic blade also was used to transect the plica to facilitate its debridement. The instruments were then removed from the anterior compartment.  A posterior lateral portal was created using the previous portal site along the lateral gutter at a spot even with the tip of the olecranon. A second posterior lateral portal site was made at the soft spot level with the radiocapitellar joint, again using an outside-in technique. The 3.5 full-radius resector was inserted through the second more distal postero-lateral portal site and the posterior portion of the thickened posterior lateral plica was excised from the posterior and lateral portions of the  radial head region. There was a flap of this plica extending between the capitellum and the radial head which also was debrided. The instruments were removed from the posterior compartment after suctioning the excess fluid. The portal sites were reapproximated using  4-0 Prolene interrupted sutures before a sterile bulky dressings were applied to the elbow. The patient was placed into a sling before she was rolled back in the supine position on her stretcher. She subsequently was extubated and returned to the recovery room in satisfactory condition after tolerating the procedure well.

## 2015-03-30 NOTE — Discharge Instructions (Signed)
General Anesthesia, Adult, Care After Refer to this sheet in the next few weeks. These instructions provide you with information on caring for yourself after your procedure. Your health care provider may also give you more specific instructions. Your treatment has been planned according to current medical practices, but problems sometimes occur. Call your health care provider if you have any problems or questions after your procedure. WHAT TO EXPECT AFTER THE PROCEDURE After the procedure, it is typical to experience:  Sleepiness.  Nausea and vomiting. HOME CARE INSTRUCTIONS  For the first 24 hours after general anesthesia:  Have a responsible person with you.  Do not drive a car. If you are alone, do not take public transportation.  Do not drink alcohol.  Do not take medicine that has not been prescribed by your health care provider.  Do not sign important papers or make important decisions.  You may resume a normal diet and activities as directed by your health care provider.  Change bandages (dressings) as directed.  If you have questions or problems that seem related to general anesthesia, call the hospital and ask for the anesthetist or anesthesiologist on call. SEEK MEDICAL CARE IF:  You have nausea and vomiting that continue the day after anesthesia.  You develop a rash. SEEK IMMEDIATE MEDICAL CARE IF:   You have difficulty breathing.  You have chest pain.  You have any allergic problems.   This information is not intended to replace advice given to you by your health care provider. Make sure you discuss any questions you have with your health care provider.   Document Released: 08/26/2000 Document Revised: 06/10/2014 Document Reviewed: 09/18/2011 Elsevier Interactive Patient Education 2016 [employer].  Keep dressing dry and intact. May remove dressing and shower on post-op day 4 (Monday). Cover stitches with Band-Aids after drying off. Use sling as necessary  for comfort. Keep hand elevated above heart level. Apply ice to affected area frequently. Return for follow-up in 10-14 days or as scheduled.

## 2015-03-30 NOTE — Anesthesia Procedure Notes (Addendum)
Anesthesia Regional Block:  Supraclavicular block  Pre-Anesthetic Checklist: ,, timeout performed, Correct Patient, Correct Site, Correct Laterality, Correct Procedure, Correct Position, site marked, Risks and benefits discussed,  Surgical consent,  Pre-op evaluation,  At surgeon's request and post-op pain management  Laterality: Left  Prep: chloraprep       Needles:  Injection technique: Single-shot  Needle Type: Echogenic Stimulator Needle      Needle Gauge: 21 and 21 G    Additional Needles:  Procedures: ultrasound guided (picture in chart) and nerve stimulator Supraclavicular block  Nerve Stimulator or Paresthesia:  Response: bicep contraction, 0.45 mA,   Additional Responses:   Narrative:  Start time: 03/30/2015 9:46 AM End time: 03/30/2015 9:52 AM Injection made incrementally with aspirations every 5 mL.  Performed by: Personally  Anesthesiologist: Ronelle Nigh  Additional Notes: Functioning IV was confirmed and monitors applied.  Sterile prep and drape,hand hygiene and sterile gloves were used.Ultrasound guidance: relevant anatomy identified, needle position confirmed, local anesthetic spread visualized around nerve(s)., vascular puncture avoided.  Image printed for medical record.  Negative aspiration and negative test dose prior to incremental administration of local anesthetic. The patient tolerated the procedure well. Vitals signes recorded in RN notes.   Procedure Name: LMA Insertion Date/Time: 03/30/2015 11:41 AM Performed by: Mayme Genta Pre-anesthesia Checklist: Patient identified, Emergency Drugs available, Suction available, Timeout performed and Patient being monitored Patient Re-evaluated:Patient Re-evaluated prior to inductionOxygen Delivery Method: Circle system utilized Preoxygenation: Pre-oxygenation with 100% oxygen Intubation Type: IV induction LMA: LMA inserted LMA Size: 4.0 Number of attempts: 1 Placement Confirmation: positive ETCO2  and breath sounds checked- equal and bilateral Tube secured with: Tape

## 2015-03-31 ENCOUNTER — Encounter: Payer: Self-pay | Admitting: Surgery

## 2015-04-07 ENCOUNTER — Ambulatory Visit (INDEPENDENT_AMBULATORY_CARE_PROVIDER_SITE_OTHER): Payer: 59 | Admitting: Family Medicine

## 2015-04-07 ENCOUNTER — Encounter: Payer: Self-pay | Admitting: Family Medicine

## 2015-04-07 VITALS — BP 122/64 | HR 84 | Temp 98.8°F | Resp 16 | Ht 69.5 in | Wt 219.2 lb

## 2015-04-07 DIAGNOSIS — E669 Obesity, unspecified: Secondary | ICD-10-CM | POA: Diagnosis not present

## 2015-04-07 DIAGNOSIS — F419 Anxiety disorder, unspecified: Secondary | ICD-10-CM

## 2015-04-07 MED ORDER — CLONAZEPAM 0.5 MG PO TABS: ORAL_TABLET | ORAL | Status: DC

## 2015-04-07 MED ORDER — PHENTERMINE HCL 37.5 MG PO CAPS: 37.5000 mg | ORAL_CAPSULE | Freq: Every day | ORAL | Status: DC

## 2015-04-07 NOTE — Patient Instructions (Signed)
Start gradual walking program daily. Start at 10 minutes daily then up to 30 minutes daily

## 2015-04-07 NOTE — Progress Notes (Signed)
Subjective:     Patient ID: Misty Zamora, female   DOB: Jul 03, 1975, 39 y.o.   MRN: [id number]  HPI  Chief Complaint  Patient presents with  . Weight Gain    Patient comes into office today with concerns of weight gain. Paitent states that she had gastric bypass surgery 4 years ago and in the past 3 weeks she has gained 14- 20lbs. Patient states that she has been taking gabapentin on a daily basis for the past 3 weeks up till Wednesday due to her back problems ( herniated disc). Patient states that she believes the steroids have caused the weight gain.   States she has been unable to go to the gym due to her back issues. Accompanied by her mother today.   Review of Systems  Constitutional: Positive for unexpected weight change (denies edema of the hand or feet).  Respiratory: Negative for shortness of breath.        Objective:   Physical Exam  Constitutional: She appears well-developed and well-nourished. No distress.  Pulmonary/Chest: Breath sounds normal.  Musculoskeletal: She exhibits no edema (of lower extremities).       Assessment:    1. Obesity - Lipid panel - Comprehensive metabolic panel - phentermine 37.5 MG capsule; Take 1 capsule (37.5 mg total) by mouth daily.  Dispense: 30 capsule; Refill: 0  2. Anxiety: continue sertraline - clonazePAM (KLONOPIN) 0.5 MG tablet; TAKE 1 TABLET BY MOUTH 2 TIMES DAILY AS NEEDED FOR ANXIETY  Dispense: 60 tablet; Refill: 2    Plan:    Discussed gradual walking program. Further f/u pending lab work and in 4 weeks.

## 2015-04-17 ENCOUNTER — Other Ambulatory Visit: Payer: Self-pay | Admitting: Neurosurgery

## 2015-04-17 DIAGNOSIS — G8929 Other chronic pain: Secondary | ICD-10-CM

## 2015-04-17 DIAGNOSIS — M545 Low back pain, unspecified: Secondary | ICD-10-CM

## 2015-04-20 ENCOUNTER — Ambulatory Visit
Admission: RE | Admit: 2015-04-20 | Discharge: 2015-04-20 | Disposition: A | Discharge status: Home / Self Care | Payer: 59 | Source: Ambulatory Visit | Attending: Neurosurgery | Admitting: Neurosurgery

## 2015-04-20 DIAGNOSIS — M545 Low back pain, unspecified: Secondary | ICD-10-CM

## 2015-04-20 DIAGNOSIS — G8929 Other chronic pain: Secondary | ICD-10-CM

## 2015-04-20 MED ORDER — ONDANSETRON HCL 4 MG/2ML IJ SOLN
4.0000 mg | Freq: Once | INTRAMUSCULAR | Status: AC
  Administered 2015-04-20: 4 mg via INTRAMUSCULAR

## 2015-04-20 MED ORDER — DIAZEPAM 5 MG PO TABS
10.0000 mg | ORAL_TABLET | Freq: Once | ORAL | Status: AC
  Administered 2015-04-20: 10 mg via ORAL

## 2015-04-20 MED ORDER — MEPERIDINE HCL 100 MG/ML IJ SOLN
100.0000 mg | Freq: Once | INTRAMUSCULAR | Status: AC
  Administered 2015-04-20: 100 mg via INTRAMUSCULAR

## 2015-04-20 MED ORDER — IOHEXOL 180 MG/ML  SOLN
15.0000 mL | Freq: Once | INTRAMUSCULAR | Status: DC | PRN
  Administered 2015-04-20: 15 mL via INTRATHECAL

## 2015-04-20 NOTE — Progress Notes (Signed)
Pt states she has been off Phentermine and Zoloft for the past 3 days.  Discharge instructions explained to pt.

## 2015-04-20 NOTE — Discharge Instructions (Signed)
Myelogram Discharge Instructions  1. Go home and rest quietly for the next 24 hours.  It is important to lie flat for the next 24 hours.  Get up only to go to the restroom.  You may lie in the bed or on a couch on your back, your stomach, your left side or your right side.  You may have one pillow under your head.  You may have pillows between your knees while you are on your side or under your knees while you are on your back.  2. DO NOT drive today.  Recline the seat as far back as it will go, while still wearing your seat belt, on the way home.  3. You may get up to go to the bathroom as needed.  You may sit up for 10 minutes to eat.  You may resume your normal diet and medications unless otherwise indicated.  Drink lots of extra fluids today and tomorrow.  4. The incidence of headache, nausea, or vomiting is about 5% (one in 20 patients).  If you develop a headache, lie flat and drink plenty of fluids until the headache goes away.  Caffeinated beverages may be helpful.  If you develop severe nausea and vomiting or a headache that does not go away with flat bed rest, call [phone].  5. You may resume normal activities after your 24 hours of bed rest is over; however, do not exert yourself strongly or do any heavy lifting tomorrow. If when you get up you have a headache when standing, go back to bed and force fluids for another 24 hours.  6. Call your physician for a follow-up appointment.  The results of your myelogram will be sent directly to your physician by the following day.  7. If you have any questions or if complications develop after you arrive home, please call [phone].  Discharge instructions have been explained to the patient.  The patient, or the person responsible for the patient, fully understands these instructions.      May resume Zoloft and Phentermine on Nov. 18, 2016, after 11:00 am.

## 2015-04-22 ENCOUNTER — Ambulatory Visit
Admission: EM | Admit: 2015-04-22 | Discharge: 2015-04-22 | Disposition: A | Discharge status: Home / Self Care | Payer: 59 | Attending: Internal Medicine | Admitting: Internal Medicine

## 2015-04-22 ENCOUNTER — Encounter: Payer: Self-pay | Admitting: Emergency Medicine

## 2015-04-22 DIAGNOSIS — H6982 Other specified disorders of Eustachian tube, left ear: Secondary | ICD-10-CM | POA: Diagnosis not present

## 2015-04-22 DIAGNOSIS — Z91048 Other nonmedicinal substance allergy status: Secondary | ICD-10-CM | POA: Diagnosis not present

## 2015-04-22 DIAGNOSIS — Z9109 Other allergy status, other than to drugs and biological substances: Secondary | ICD-10-CM

## 2015-04-22 DIAGNOSIS — B349 Viral infection, unspecified: Secondary | ICD-10-CM | POA: Diagnosis not present

## 2015-04-22 MED ORDER — ACETAMINOPHEN 500 MG PO TABS
1000.0000 mg | ORAL_TABLET | Freq: Once | ORAL | Status: AC
  Administered 2015-04-22: 1000 mg via ORAL

## 2015-04-22 MED ORDER — FLUTICASONE PROPIONATE 50 MCG/ACT NA SUSP: 2.0000 | Freq: Every day | NASAL | Status: DC

## 2015-04-22 NOTE — ED Notes (Signed)
Started cold symptoms for 3 days, now throat itchy, body aches ,chills, fever, dizzy.

## 2015-04-22 NOTE — Discharge Instructions (Signed)

## 2015-04-24 NOTE — ED Notes (Signed)
Misty Zamora called today requesting cough suppressant for sinus drainage.  Was seen at [facility] this past weekend.  Presented information to Dr. Zenda Alpers, verbal called in to [place] for Tessalon Perrls 200 mg tid quanity 30 no refills.

## 2015-04-24 NOTE — ED Notes (Signed)
Misty Zamora called today with requests for cough supresant.  W

## 2015-04-26 ENCOUNTER — Encounter: Payer: Self-pay | Admitting: Physician Assistant

## 2015-04-26 NOTE — ED Provider Notes (Signed)
CSN: [id number]     Arrival date & time 04/22/15  1527 History   First MD Initiated Contact with Patient 04/22/15 1818     Chief Complaint  Patient presents with  . 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 Denies fever. Has fatigue, chest congestion and cough.  Had gastric bypass with excellent results but is aware of snoring with new URI. Has used Claritin in the past and started it back a day or 2 ago-possibly feeling better   Past Medical History  Diagnosis Date  . History of cardiovascular stress test 2005    showed MR and TR  . Asthma   . Depression   . Anxiety   . PONV (postoperative nausea and vomiting)     hypotension with epidural  . Hypertension     no meds since gastric bypass  . Heart murmur 2005  . Sleep apnea     was better after gastric bypass, hasd recently started snoring again.  . Anemia     had iron infusions. last one in Sept. told levels are now normal  . GERD (gastroesophageal reflux disease)     occasional  . Bulging lumbar disc     causes bilateral leg pain  . 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 History of kidney stones   . Wears contact lenses    Past Surgical History  Procedure Laterality Date  . Gallbladder removed    . Laproscopy    . Colonoscopy  2010  . Renal endoscopy via nephrostomy / pyelostomy  2010  . Tubal ligation    . Gastric bypass  2012  . Dilation and curettage of uterus    . Elbow surgery Left   . Laparoscopic hysterectomy N/A 12/27/2014    Procedure: HYSTERECTOMY TOTAL LAPAROSCOPIC;  Surgeon: Will Bonnet, MD;  Location: [facility];  Service: Gynecology;  Laterality: N/A;  . Bilateral salpingectomy Bilateral 12/27/2014    Procedure: BILATERAL SALPINGECTOMY;  Surgeon: Will Bonnet, MD;  Location:  [facility];  Service: Gynecology;  Laterality: Bilateral;  . Cystoscopy N/A 12/27/2014    Procedure: CYSTOSCOPY;  Surgeon: Will Bonnet, MD;  Location: [facility];  Service: Gynecology;  Laterality: N/A;  . Elbow arthroscopy Left 03/30/2015    Procedure: ARTHROSCOPY ELBOW WITH DEBRIDEMENT OF THE SYMPTOMATIC PLICA;  Surgeon: Corky Mull, MD;  Location: [place];  Service: Orthopedics;  Laterality: Left;   Family History  Problem Relation Age of Onset  . Hypertension Mother   . Hyperlipidemia Mother   . Diabetes Mother   . Cancer Mother     breast cancer; 04/2011  . Colon polyps Mother   . Hyperlipidemia Father   . Hypertension Father   . Colon polyps Father   . Hypertension Sister   . Hypertension Brother   . Cancer Maternal Uncle     terminal lung cancer   Social History  Substance Use Topics  . Smoking status: Former Smoker -- 0.00 packs/day for 8 years    Types: Cigarettes    Quit date: 06/04/1999  . Smokeless tobacco: Never Used  . Alcohol Use: Yes     Comment: 1 drink every few months   OB History    Gravida Para Term Preterm AB TAB SAB Ectopic Multiple Living   5 3  Obstetric Comments   NSVD x 3 SAB x 2     Review of Systems Review of 10 systems negative for acute change except as referenced in HPI   Allergies  Review of patient's allergies indicates no known allergies.  Home Medications   Prior to Admission medications   Medication Sig Start Date End Date Taking? Authorizing Provider  phentermine 37.5 MG capsule Take 1 capsule (37.5 mg total) by mouth daily. 04/07/15  Yes Carmon Ginsberg, PA  acetaminophen (TYLENOL) 500 MG tablet Take 2 tablets (1,000 mg total) by mouth every 6 (six) hours. 12/28/14   Will Bonnet, MD  clonazePAM (KLONOPIN) 0.5 MG tablet TAKE 1 TABLET BY MOUTH 2 TIMES DAILY AS NEEDED FOR ANXIETY 04/07/15   Carmon Ginsberg, PA  fluticasone [facility]) 50 MCG/ACT nasal spray Place 2 sprays into both nostrils daily.  04/22/15   Jan Fireman, PA-C  HYDROcodone-acetaminophen (NORCO) 7.5-325 MG tablet TAKE 1 TABLET BY MOUTH EVERY 6 HOURS AS NEEDED FOR MODERATE PAIN 03/28/15   Historical Provider, MD  omeprazole (PRILOSEC) 20 MG capsule Take 20 mg by mouth 2 (two) times daily before a meal.     Historical Provider, MD  oxyCODONE (ROXICODONE) 5 MG immediate release tablet Take 1-2 tablets (5-10 mg total) by mouth every 4 (four) hours as needed for severe pain. 03/30/15   Corky Mull, MD  sertraline (ZOLOFT) 50 MG tablet Take 1 tablet (50 mg total) by mouth daily. 01/05/15   Carmon Ginsberg, PA   Meds Ordered and Administered this Visit   Medications  acetaminophen (TYLENOL) tablet 1,000 mg (1,000 mg Oral Given 04/22/15 1725)    BP 106/70 mmHg  Pulse 83  Temp(Src) 98.2 F (36.8 C) (Oral)  Resp 18  Ht 5\' 10"  (1.778 m)  Wt 204 lb (92.534 kg)  BMI 29.27 kg/m2  SpO2 100%  LMP 12/14/2014 (Exact Date) No data found.   Physical Exam   VS noted, WNL  GENERAL : NAD HEENT: no pharyngeal erythema,no exudate, no erythema of TMs, lft ear pops with valsalva  no cervical LAD, nasal congestion present clear to yellow RESP: CTA  B , no wheezing, no accessory muscle use,  CARD: RRR ABD: Not distended NEURO: Good attention, good recall, no gross neuro defecit PSYCH: speech and behavior appropriate   ED Course  Procedures (including critical care time)  Labs Review Labs Reviewed - No data to display  Imaging Review No results found.    MDM   1. Viral syndrome   2. Eustachian tube dysfunction, left   3. Environmental allergies    Plan:  Diagnosis reviewed with patient Rx as per orders;  benefits, risks, potential side effects reviewed  Add Flonase , increase Claritin to BID for 2-3 days; increase PO fluids, steam bath/shower  Recommend supportive treatment with cyclic tylenol and ibuprofen Seek additional medical care if symptoms worsen or are not improving Has left work today and weekend off  duty- Ok to return on Monday unless increased symptoms/fever  Discharge Medication List as of 04/22/2015  6:32 PM    START taking these medications   Details  fluticasone (FLONASE) 50 MCG/ACT nasal spray Place 2 sprays into both nostrils daily., Starting 04/22/2015, Until Discontinued, Print           Jan Fireman, PA-C 04/26/15 1459

## 2015-04-28 ENCOUNTER — Encounter: Payer: Self-pay | Admitting: Family Medicine

## 2015-04-28 ENCOUNTER — Ambulatory Visit (INDEPENDENT_AMBULATORY_CARE_PROVIDER_SITE_OTHER): Payer: 59 | Admitting: Family Medicine

## 2015-04-28 VITALS — BP 110/80 | HR 76 | Temp 98.5°F | Resp 16 | Wt 212.0 lb

## 2015-04-28 DIAGNOSIS — J4540 Moderate persistent asthma, uncomplicated: Secondary | ICD-10-CM

## 2015-04-28 MED ORDER — ALBUTEROL SULFATE HFA 108 (90 BASE) MCG/ACT IN AERS: 2.0000 | INHALATION_SPRAY | Freq: Four times a day (QID) | RESPIRATORY_TRACT | Status: DC | PRN

## 2015-04-28 MED ORDER — PREDNISONE 5 MG PO TABS: 5.0000 mg | ORAL_TABLET | Freq: Every day | ORAL | Status: DC

## 2015-04-28 MED ORDER — AMOXICILLIN-POT CLAVULANATE 875-125 MG PO TABS: 1.0000 | ORAL_TABLET | Freq: Two times a day (BID) | ORAL | Status: DC

## 2015-04-28 MED ORDER — HYDROCODONE-HOMATROPINE 5-1.5 MG/5ML PO SYRP: 5.0000 mL | ORAL_SOLUTION | Freq: Three times a day (TID) | ORAL | Status: DC | PRN

## 2015-04-28 NOTE — Patient Instructions (Signed)

## 2015-04-28 NOTE — Progress Notes (Signed)
Patient: Misty Zamora Female    DOB: 03-Dec-1975   39 y.o.   MRN: [id number] Visit Date: 04/28/2015  Today's Provider: Vernie Murders, PA   Chief Complaint  Patient presents with  . Cough   Subjective:    Cough This is a new problem. The current episode started in the past 7 days. The problem has been gradually worsening. The problem occurs constantly. The cough is productive of sputum. Associated symptoms include ear congestion, a fever, postnasal drip and wheezing. Pertinent negatives include no chest pain, chills, ear pain, headaches, nasal congestion or shortness of breath. The symptoms are aggravated by lying down. She has tried prescription cough suppressant for the symptoms. The treatment provided no relief.  Went to an Urgent [facility] 04-22-15 and treated with Flonase, Tessalon Perles and antihistamine. Feels cough and yellow sputum production is getting worse.   Patient Active Problem List   Diagnosis Date Noted  . Obesity 04/07/2015  . Allergic rhinitis 02/18/2015  . Anxiety 02/18/2015  . H/O renal calculi 02/18/2015  . 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 Multinodular goiter 01/02/2015  . Status post laparoscopic hysterectomy 12/27/2014  . Chronic female pelvic pain 12/08/2014  . IDA (iron deficiency anemia) 12/01/2014  . Abnormal LFTs 01/29/2012   Past Surgical History  Procedure Laterality Date  . Gallbladder removed    . Laproscopy    . Colonoscopy  2010  . Renal endoscopy via nephrostomy / pyelostomy  2010  . Tubal ligation    . Gastric bypass  2012  . Dilation and curettage of uterus    . Elbow surgery Left   . Laparoscopic hysterectomy N/A 12/27/2014    Procedure: HYSTERECTOMY TOTAL LAPAROSCOPIC;  Surgeon: Will Bonnet, MD;  Location: [facility];  Service: Gynecology;  Laterality: N/A;  . Bilateral salpingectomy Bilateral 12/27/2014    Procedure: BILATERAL SALPINGECTOMY;  Surgeon: Will Bonnet, MD;  Location: [facility];  Service: Gynecology;   Laterality: Bilateral;  . Cystoscopy N/A 12/27/2014    Procedure: CYSTOSCOPY;  Surgeon: Will Bonnet, MD;  Location: [facility];  Service: Gynecology;  Laterality: N/A;  . Elbow arthroscopy Left 03/30/2015    Procedure: ARTHROSCOPY ELBOW WITH DEBRIDEMENT OF THE SYMPTOMATIC PLICA;  Surgeon: Corky Mull, MD;  Location: [place];  Service: Orthopedics;  Laterality: Left;  . Abdominal hysterectomy  12/2014    ovaries in situ   Family History  Problem Relation Age of Onset  . Hypertension Mother   . Hyperlipidemia Mother   . Diabetes Mother   . Cancer Mother     breast cancer; 04/2011  . Colon polyps Mother   . Hyperlipidemia Father   . Hypertension Father   . Colon polyps Father   . Hypertension Sister   . Hypertension Brother   . Cancer Maternal Uncle     terminal lung cancer   No Known Allergies   Previous Medications   ACETAMINOPHEN (TYLENOL) 500 MG TABLET    Take 2 tablets (1,000 mg total) by mouth every 6 (six) hours.   BENZONATATE (TESSALON PERLES PO)    Take by mouth.   CLONAZEPAM (KLONOPIN) 0.5 MG TABLET    TAKE 1 TABLET BY MOUTH 2 TIMES DAILY AS NEEDED FOR ANXIETY   FLUTICASONE (FLONASE) 50 MCG/ACT NASAL SPRAY    Place 2 sprays into both nostrils daily.   OMEPRAZOLE (PRILOSEC) 20 MG CAPSULE    Take 20 mg by mouth 2 (two) times daily before a meal.  OXYCODONE (ROXICODONE) 5 MG IMMEDIATE RELEASE TABLET    Take 1-2 tablets (5-10 mg total) by mouth every 4 (four) hours as needed for severe pain.   PHENTERMINE 37.5 MG CAPSULE    Take 1 capsule (37.5 mg total) by mouth daily.   SERTRALINE (ZOLOFT) 50 MG TABLET    Take 1 tablet (50 mg total) by mouth daily.    Review of Systems  Constitutional: Positive for fever. Negative for chills.  HENT: Positive for postnasal drip. Negative for ear pain.   Respiratory: Positive for cough and wheezing. Negative for shortness of breath.   Cardiovascular: Negative for chest pain.  Neurological: Negative for headaches.     Social History  Substance Use Topics  . Smoking status: Former Smoker -- 0.00 packs/day for 8 years    Types: Cigarettes    Quit date: 06/04/1999  . Smokeless tobacco: Never Used  . Alcohol Use: Yes     Comment: 1 drink every few months   Objective:   BP 110/80 mmHg  Pulse 76  Temp(Src) 98.5 F (36.9 C) (Oral)  Resp 16  Wt 212 lb (96.163 kg)  LMP 12/14/2014 (Exact Date)  Physical Exam  Constitutional: She is oriented to person, place, and time. She appears well-developed and well-nourished.  HENT:  Head: Normocephalic.  Right Ear: External ear normal.  Left Ear: External ear normal.  Nose: Nose normal.  Mouth/Throat: Oropharynx is clear and moist.  Eyes: Conjunctivae and EOM are normal.  Neck: Normal range of motion. Neck supple.  Cardiovascular: Normal rate, regular rhythm and normal heart sounds.   Pulmonary/Chest:  Coarse breath sounds. No wheeze or rales.  Neurological: She is alert and oriented to person, place, and time.  Psychiatric: She has a normal mood and affect. Her behavior is normal.      Assessment & Plan:     1. Asthmatic bronchitis, moderate persistent, uncomplicated Persistent cough with some wheeze and purulent sputum. Will treat with steroid, cough suppressant and antibiotic. May add Mucinex plain (expectorant) and Proventil-HFA if wheezing. Recheck in 1 week. Recommend she stay out of work for 3 days. - predniSONE (DELTASONE) 5 MG tablet; Take 1 tablet (5 mg total) by mouth daily with breakfast. Tapered daily by one tablet over the next 6 days (6,5,4,3,2,1)  Dispense: 21 tablet; Refill: 0 - amoxicillin-clavulanate (AUGMENTIN) 875-125 MG tablet; Take 1 tablet by mouth 2 (two) times daily.  Dispense: 20 tablet; Refill: 0 - albuterol (PROVENTIL HFA;VENTOLIN HFA) 108 (90 BASE) MCG/ACT inhaler; Inhale 2 puffs into the lungs every 6 (six) hours as needed for wheezing or shortness of breath.  Dispense: 1 Inhaler; Refill: 0 - HYDROcodone-homatropine  (HYCODAN) 5-1.5 MG/5ML syrup; Take 5 mLs by mouth every 8 (eight) hours as needed for cough.  Dispense: 120 mL; Refill: 0       Vernie Murders, PA  [facility]

## 2015-05-03 ENCOUNTER — Telehealth: Payer: Self-pay | Admitting: Family Medicine

## 2015-05-03 NOTE — Telephone Encounter (Signed)
Pt stated that she needs the FMLA forms that were sent to [employer] to be intermittent so it will cover her follow up appt that she has with Simona Huh on 05/05/15. Pt stated that she doesn't want to get in trouble at work for missing time due to her doctor appt. Please advise. Thanks TNP

## 2015-05-04 ENCOUNTER — Ambulatory Visit: Payer: 59 | Admitting: Family Medicine

## 2015-05-04 NOTE — Telephone Encounter (Signed)
Left patient a voicemail advising her that forms are ready for pick up.

## 2015-05-04 NOTE — Telephone Encounter (Signed)
Advise patient forms done.

## 2015-05-05 ENCOUNTER — Encounter: Payer: Self-pay | Admitting: Family Medicine

## 2015-05-05 ENCOUNTER — Ambulatory Visit (INDEPENDENT_AMBULATORY_CARE_PROVIDER_SITE_OTHER): Payer: 59 | Admitting: Family Medicine

## 2015-05-05 ENCOUNTER — Ambulatory Visit: Payer: 59 | Admitting: Family Medicine

## 2015-05-05 VITALS — BP 102/78 | HR 82 | Temp 98.6°F | Resp 16 | Wt 211.0 lb

## 2015-05-05 DIAGNOSIS — J45909 Unspecified asthma, uncomplicated: Secondary | ICD-10-CM | POA: Diagnosis not present

## 2015-05-05 NOTE — Progress Notes (Signed)
Patient ID: Misty Zamora, female   DOB: 1976-04-08, 39 y.o.   MRN: [id number]   Chief Complaint  Patient presents with  . Bronchitis  . Follow-up    Subjective:  HPI Feeling better since starting the antibiotic. Had some low grade fevers over the weekend. Finished the prednisone and cough much better. Still some ache and chills occasionally. No nasal stuffiness. Normal appetite. Still on the Augmentin (will finish in 2 days).  Prior to Admission medications   Medication Sig Start Date End Date Taking? Authorizing Provider  acetaminophen (TYLENOL) 500 MG tablet Take 2 tablets (1,000 mg total) by mouth every 6 (six) hours. 12/28/14  Yes Will Bonnet, MD  albuterol (PROVENTIL HFA;VENTOLIN HFA) 108 (90 BASE) MCG/ACT inhaler Inhale 2 puffs into the lungs every 6 (six) hours as needed for wheezing or shortness of breath. 04/28/15  Yes Dennis E Chrismon, PA  amoxicillin-clavulanate (AUGMENTIN) 875-125 MG tablet Take 1 tablet by mouth 2 (two) times daily. 04/28/15  Yes Dennis E Chrismon, PA  clonazePAM (KLONOPIN) 0.5 MG tablet TAKE 1 TABLET BY MOUTH 2 TIMES DAILY AS NEEDED FOR ANXIETY 04/07/15  Yes Carmon Ginsberg, PA  fluticasone (FLONASE) 50 MCG/ACT nasal spray Place 2 sprays into both nostrils daily. 04/22/15  Yes Jan Fireman, PA-C  omeprazole (PRILOSEC) 20 MG capsule Take 20 mg by mouth 2 (two) times daily before a meal.    Yes Historical Provider, MD  oxyCODONE (ROXICODONE) 5 MG immediate release tablet Take 1-2 tablets (5-10 mg total) by mouth every 4 (four) hours as needed for severe pain. 03/30/15  Yes Corky Mull, MD  phentermine 37.5 MG capsule Take 1 capsule (37.5 mg total) by mouth daily. 04/07/15  Yes Carmon Ginsberg, PA  sertraline (ZOLOFT) 50 MG tablet Take 1 tablet (50 mg total) by mouth daily. 01/05/15  Yes Carmon Ginsberg, PA    Patient Active Problem List   Diagnosis Date Noted  . Obesity 04/07/2015  . Allergic rhinitis 02/18/2015  . Anxiety 02/18/2015  . H/O renal calculi  02/18/2015  . 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 Multinodular goiter 01/02/2015  . Status post laparoscopic hysterectomy 12/27/2014  . Chronic female pelvic pain 12/08/2014  . IDA (iron deficiency anemia) 12/01/2014  . Abnormal LFTs 01/29/2012    Past Medical History  Diagnosis Date  . History of cardiovascular stress test 2005    showed MR and TR  . Asthma   . Depression   . Anxiety   . PONV (postoperative nausea and vomiting)     hypotension with epidural  . Hypertension     no meds since gastric bypass  . Heart murmur 2005  . Sleep apnea     was better after gastric bypass, hasd recently started snoring again.  . Anemia     had iron infusions. last one in Sept. told levels are now normal  . GERD (gastroesophageal reflux disease)     occasional  . Bulging lumbar disc     causes bilateral leg pain  . 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 History of kidney stones   . Wears contact lenses    Past Surgical History  Procedure Laterality Date  . Gallbladder removed    . Laproscopy    . Colonoscopy  2010  . Renal endoscopy via nephrostomy / pyelostomy  2010  . Tubal ligation    . Gastric bypass  2012  . Dilation and curettage of uterus    . Elbow surgery Left   .  Laparoscopic hysterectomy N/A 12/27/2014    Procedure: HYSTERECTOMY TOTAL LAPAROSCOPIC;  Surgeon: Will Bonnet, MD;  Location: [facility];  Service: Gynecology;  Laterality: N/A;  . Bilateral salpingectomy Bilateral 12/27/2014    Procedure: BILATERAL SALPINGECTOMY;  Surgeon: Will Bonnet, MD;  Location: [facility];  Service: Gynecology;  Laterality: Bilateral;  . Cystoscopy N/A 12/27/2014    Procedure: CYSTOSCOPY;  Surgeon: Will Bonnet, MD;  Location: [facility];  Service: Gynecology;  Laterality: N/A;  . Elbow arthroscopy Left 03/30/2015    Procedure: ARTHROSCOPY ELBOW WITH DEBRIDEMENT OF THE SYMPTOMATIC PLICA;  Surgeon: Corky Mull, MD;  Location: [place];  Service: Orthopedics;   Laterality: Left;  . Abdominal hysterectomy  12/2014    ovaries in situ   Family History  Problem Relation Age of Onset  . Hypertension Mother   . Hyperlipidemia Mother   . Diabetes Mother   . Cancer Mother     breast cancer; 04/2011  . Colon polyps Mother   . Hyperlipidemia Father   . Hypertension Father   . Colon polyps Father   . Hypertension Sister   . Hypertension Brother   . Cancer Maternal Uncle     terminal lung cancer    Social History   Social History  . Marital Status: Married    Spouse Name: N/A  . Number of Children: N/A  . Years of Education: N/A   Occupational History  .      Full Time   Social History Main Topics  . Smoking status: Former Smoker -- 0.00 packs/day for 8 years    Types: Cigarettes    Quit date: 06/04/1999  . Smokeless tobacco: Never Used  . Alcohol Use: Yes     Comment: 1 drink every few months  . Drug Use: No  . Sexual Activity: Not on file   Other Topics Concern  . Not on file   Social History Narrative   No regular exercise         No Known Allergies  Review of Systems  Constitutional: Positive for chills.  HENT: Positive for congestion.   Eyes: Negative.   Respiratory: Negative.   Cardiovascular: Negative.   Gastrointestinal: Negative.   Genitourinary: Negative.   Musculoskeletal: Negative.   Skin: Negative.   Neurological: Negative.   Endo/Heme/Allergies: Negative.   Psychiatric/Behavioral: Negative.     Objective:  BP 102/78 mmHg  Pulse 82  Temp(Src) 98.6 F (37 C) (Oral)  Resp 16  Wt 211 lb (95.709 kg)  SpO2 99%  LMP 12/14/2014 (Exact Date) Wt Readings from Last 3 Encounters:  05/05/15 211 lb (95.709 kg)  04/28/15 212 lb (96.163 kg)  04/22/15 204 lb (92.534 kg)    Physical Exam  Constitutional: She is well-developed, well-nourished, and in no distress.  HENT:  Head: Normocephalic.  Right Ear: External ear normal.  Left Ear: External ear normal.  Nose: Nose normal.  Mouth/Throat: Oropharynx  is clear and moist.  Eyes: Conjunctivae and EOM are normal.  Neck: Normal range of motion. Neck supple.  Cardiovascular: Normal rate, regular rhythm and normal heart sounds.   Pulmonary/Chest: Effort normal and breath sounds normal. She has no wheezes. She has no rales.  Abdominal: Soft. Bowel sounds are normal.    Lab Results  Component Value Date   WBC 5.7 03/03/2015   HGB 13.0 03/03/2015   HCT 39.0 03/03/2015   PLT 158 03/03/2015   GLUCOSE 82 12/28/2014   CHOL 114 01/23/2012   TRIG 50 01/23/2012  HDL 39* 01/23/2012   LDLCALC 65 01/23/2012   TSH 0.756 10/26/2014   INR 0.97 10/26/2014   HGBA1C 5.1 10/26/2014    CMP     Component Value Date/Time   NA 139 12/28/2014 0516   NA 135* 06/21/2013 1718   K 3.7 12/28/2014 0516   K 3.9 06/21/2013 1718   CL 103 12/28/2014 0516   CL 104 06/21/2013 1718   CO2 31 12/28/2014 0516   CO2 28 06/21/2013 1718   GLUCOSE 82 12/28/2014 0516   GLUCOSE 83 06/21/2013 1718   BUN 6 12/28/2014 0516   BUN 5* 06/21/2013 1718   CREATININE 0.59 12/28/2014 0516   CREATININE 0.62 06/21/2013 1718   CALCIUM 8.9 12/28/2014 0516   CALCIUM 8.8 06/21/2013 1718   PROT 6.6 12/21/2014 1042   PROT 7.4 06/21/2013 1718   ALBUMIN 3.7 12/21/2014 1042   ALBUMIN 3.7 06/21/2013 1718   AST 18 12/21/2014 1042   AST 20 06/21/2013 1718   ALT 13* 12/21/2014 1042   ALT 17 06/21/2013 1718   ALKPHOS 44 12/21/2014 1042   ALKPHOS 58 06/21/2013 1718   BILITOT 0.2* 12/21/2014 1042   BILITOT 0.4 06/21/2013 1718   GFRNONAA >60 12/28/2014 0516   GFRNONAA >60 06/21/2013 1718   GFRNONAA >60 08/26/2011 2310   GFRAA >60 12/28/2014 0516   GFRAA >60 06/21/2013 1718   GFRAA >60 08/26/2011 2310    Assessment and Plan :  1. Asthmatic bronchitis, unspecified asthma severity, uncomplicated Symptoms resolved. Feeling much better. Only slight decrease in endurance remains. Will finish the Augmentin in 2 days. Was out of work 04-28-15 through 05-01-15 (technically out of work  longer because she was not scheduled to work until 05-04-15). Released to full duties. Recheck prn.  [facility] 05/05/2015 11:35 AM

## 2015-05-09 ENCOUNTER — Telehealth: Payer: Self-pay | Admitting: Family Medicine

## 2015-05-09 NOTE — Telephone Encounter (Signed)
Pt called saying she is still feeling bad.  She was in Friday Feeling better.  Sunday she statred having coughing again and feeling bad.  She is finished the medications.  Her heart rate has been in the 130's.  Please advise.  [phone]  Thanks Con Memos

## 2015-05-09 NOTE — Telephone Encounter (Signed)
Please advise 

## 2015-05-09 NOTE — Telephone Encounter (Signed)
Pt called saying she is still coughing and feeling really bad.  No fever.  Heart rate running a little high 130's.  She is finished the antibiotics.  What else can she do.  She was feeling better Friday when she came in.  Started feeling worse Sunday.

## 2015-05-09 NOTE — Telephone Encounter (Signed)
Left a message for the patient to return my call tomorrow (05-10-15). Ask if she is having wheezing and chest tightness with cough. May need prednisone 10 mg 6 day taper #21 and recheck if any fever. Be sure she is not taking the Phentermine listed in her chart. Side effects could cause heart to race and flare general "feel bad" sensation.

## 2015-05-10 ENCOUNTER — Telehealth: Payer: Self-pay | Admitting: Family Medicine

## 2015-05-10 ENCOUNTER — Encounter: Payer: Self-pay | Admitting: *Deleted

## 2015-05-10 ENCOUNTER — Emergency Department: Payer: 59

## 2015-05-10 ENCOUNTER — Emergency Department
Admission: EM | Admit: 2015-05-10 | Discharge: 2015-05-10 | Disposition: A | Discharge status: Home / Self Care | Payer: 59 | Attending: Emergency Medicine | Admitting: Emergency Medicine

## 2015-05-10 DIAGNOSIS — Z792 Long term (current) use of antibiotics: Secondary | ICD-10-CM | POA: Diagnosis not present

## 2015-05-10 DIAGNOSIS — Z79899 Other long term (current) drug therapy: Secondary | ICD-10-CM | POA: Insufficient documentation

## 2015-05-10 DIAGNOSIS — Z87891 Personal history of nicotine dependence: Secondary | ICD-10-CM | POA: Diagnosis not present

## 2015-05-10 DIAGNOSIS — J189 Pneumonia, unspecified organism: Secondary | ICD-10-CM | POA: Insufficient documentation

## 2015-05-10 DIAGNOSIS — J181 Lobar pneumonia, unspecified organism: Secondary | ICD-10-CM

## 2015-05-10 DIAGNOSIS — R0981 Nasal congestion: Secondary | ICD-10-CM | POA: Diagnosis present

## 2015-05-10 DIAGNOSIS — I10 Essential (primary) hypertension: Secondary | ICD-10-CM | POA: Insufficient documentation

## 2015-05-10 LAB — COMPREHENSIVE METABOLIC PANEL
ALT: 11 U/L — ABNORMAL LOW (ref 14–54)
ANION GAP: 8 (ref 5–15)
AST: 15 U/L (ref 15–41)
Albumin: 3.7 g/dL (ref 3.5–5.0)
Alkaline Phosphatase: 55 U/L (ref 38–126)
BILIRUBIN TOTAL: 0.5 mg/dL (ref 0.3–1.2)
BUN: 6 mg/dL (ref 6–20)
CHLORIDE: 104 mmol/L (ref 101–111)
CO2: 28 mmol/L (ref 22–32)
Calcium: 9.1 mg/dL (ref 8.9–10.3)
Creatinine, Ser: 0.65 mg/dL (ref 0.44–1.00)
Glucose, Bld: 77 mg/dL (ref 65–99)
POTASSIUM: 3.6 mmol/L (ref 3.5–5.1)
Sodium: 140 mmol/L (ref 135–145)
TOTAL PROTEIN: 6.9 g/dL (ref 6.5–8.1)

## 2015-05-10 LAB — CBC WITH DIFFERENTIAL/PLATELET
BASOS ABS: 0 10*3/uL (ref 0–0.1)
Basophils Relative: 1 %
EOS PCT: 1 %
Eosinophils Absolute: 0.1 10*3/uL (ref 0–0.7)
HCT: 38.7 % (ref 35.0–47.0)
Hemoglobin: 13.2 g/dL (ref 12.0–16.0)
LYMPHS ABS: 1.3 10*3/uL (ref 1.0–3.6)
LYMPHS PCT: 18 %
MCH: 29 pg (ref 26.0–34.0)
MCHC: 34.1 g/dL (ref 32.0–36.0)
MCV: 85 fL (ref 80.0–100.0)
MONO ABS: 0.4 10*3/uL (ref 0.2–0.9)
MONOS PCT: 5 %
Neutro Abs: 5.4 10*3/uL (ref 1.4–6.5)
Neutrophils Relative %: 75 %
PLATELETS: 215 10*3/uL (ref 150–440)
RBC: 4.56 MIL/uL (ref 3.80–5.20)
RDW: 14.2 % (ref 11.5–14.5)
WBC: 7.2 10*3/uL (ref 3.6–11.0)

## 2015-05-10 MED ORDER — PSEUDOEPH-BROMPHEN-DM 30-2-10 MG/5ML PO SYRP: 10.0000 mL | ORAL_SOLUTION | Freq: Four times a day (QID) | ORAL | Status: DC | PRN

## 2015-05-10 MED ORDER — LEVOFLOXACIN IN D5W 250 MG/50ML IV SOLN
250.0000 mg | Freq: Once | INTRAVENOUS | Status: AC
  Administered 2015-05-10: 250 mg via INTRAVENOUS
  Filled 2015-05-10: qty 50

## 2015-05-10 MED ORDER — LEVOFLOXACIN 750 MG PO TABS: 750.0000 mg | ORAL_TABLET | Freq: Every day | ORAL | Status: AC

## 2015-05-10 MED ORDER — SODIUM CHLORIDE 0.9 % IV BOLUS (SEPSIS)
500.0000 mL | Freq: Once | INTRAVENOUS | Status: AC
  Administered 2015-05-10: 500 mL via INTRAVENOUS

## 2015-05-10 MED ORDER — IBUPROFEN 800 MG PO TABS
800.0000 mg | ORAL_TABLET | Freq: Once | ORAL | Status: AC
  Administered 2015-05-10: 800 mg via ORAL
  Filled 2015-05-10: qty 1

## 2015-05-10 NOTE — ED Notes (Signed)
Pt here with URI symptoms and fever episodes x 3 weeks. Pt has completed 1 round of antibiotics.

## 2015-05-10 NOTE — Discharge Instructions (Signed)

## 2015-05-10 NOTE — ED Provider Notes (Signed)
[facility] Emergency Department Provider Note  ____________________________________________  Time seen: Approximately 10:15 AM  I have reviewed the triage vital signs and the nursing notes.   HISTORY  Chief Complaint URI    HPI Misty Zamora is a 39 y.o. female who presents emergency Department with complaint of upper respiratory symptoms 3 weeks. She states that she was seen initially at an urgent care and was diagnosed with upper respiratory viral infection and was told to use an increased dosing of her daily allergy medication and given Flonase. She states symptoms started to worsen when she called back and was given Tessalon pearls. She reports she waited several more days with with worsening of symptoms. She then proceeded to her primary care provider who diagnosed her with a bacterial infection and placed her on antibiotics. Patient states that she was taking antibiotics with minimal relief and steroids were at that time at it. She reports she had a mild improvement, was seen by her primary care release back to work, and then had a worsening of her symptoms 3-4 days later. Patient presents emergency department complaining of mild nasal congestion and sore throat. Endorses an increase in her coughing, mild shortness of breath, audible wheezing, and "lung pain." Patient denies headache, neck pain, visual acuity changes, shortness of breath, abdominal pain, nausea or vomiting at this time.   Past Medical History  Diagnosis Date  . History of cardiovascular stress test 2005    showed MR and TR  . Depression   . Anxiety   . PONV (postoperative nausea and vomiting)     hypotension with epidural  . Hypertension     no meds since gastric bypass  . Heart murmur 2005  . Sleep apnea     was better after gastric bypass, hasd recently started snoring again.  . Anemia     had iron infusions. last one in Sept. told levels are now normal  . GERD (gastroesophageal  reflux disease)     occasional  . Bulging lumbar disc     causes bilateral leg pain  . 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 History of kidney stones   . Wears contact lenses     Patient Active Problem List   Diagnosis Date Noted  . Obesity 04/07/2015  . Allergic rhinitis 02/18/2015  . Anxiety 02/18/2015  . H/O renal calculi 02/18/2015  . 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 Multinodular goiter 01/02/2015  . Status post laparoscopic hysterectomy 12/27/2014  . Chronic female pelvic pain 12/08/2014  . IDA (iron deficiency anemia) 12/01/2014  . Abnormal LFTs 01/29/2012    Past Surgical History  Procedure Laterality Date  . Gallbladder removed    . Laproscopy    . Colonoscopy  2010  . Renal endoscopy via nephrostomy / pyelostomy  2010  . Tubal ligation    . Gastric bypass  2012  . Dilation and curettage of uterus    . Elbow surgery Left   . Laparoscopic hysterectomy N/A 12/27/2014    Procedure: HYSTERECTOMY TOTAL LAPAROSCOPIC;  Surgeon: Will Bonnet, MD;  Location: [facility];  Service: Gynecology;  Laterality: N/A;  . Bilateral salpingectomy Bilateral 12/27/2014    Procedure: BILATERAL SALPINGECTOMY;  Surgeon: Will Bonnet, MD;  Location: [facility];  Service: Gynecology;  Laterality: Bilateral;  . Cystoscopy N/A 12/27/2014    Procedure: CYSTOSCOPY;  Surgeon: Will Bonnet, MD;  Location: [facility];  Service: Gynecology;  Laterality: N/A;  . Elbow arthroscopy Left 03/30/2015  Procedure: ARTHROSCOPY ELBOW WITH DEBRIDEMENT OF THE SYMPTOMATIC PLICA;  Surgeon: Corky Mull, MD;  Location: [facility];  Service: Orthopedics;  Laterality: Left;  . Abdominal hysterectomy  12/2014    ovaries in situ    Current Outpatient Rx  Name  Route  Sig  Dispense  Refill  . acetaminophen (TYLENOL) 500 MG tablet   Oral   Take 2 tablets (1,000 mg total) by mouth every 6 (six) hours.   30 tablet   0   . albuterol (PROVENTIL HFA;VENTOLIN HFA) 108 (90 BASE) MCG/ACT inhaler    Inhalation   Inhale 2 puffs into the lungs every 6 (six) hours as needed for wheezing or shortness of breath.   1 Inhaler   0   . amoxicillin-clavulanate (AUGMENTIN) 875-125 MG tablet   Oral   Take 1 tablet by mouth 2 (two) times daily.   20 tablet   0   . brompheniramine-pseudoephedrine-DM 30-2-10 MG/5ML syrup   Oral   Take 10 mLs by mouth 4 (four) times daily as needed.   200 mL   0   . clonazePAM (KLONOPIN) 0.5 MG tablet      TAKE 1 TABLET BY MOUTH 2 TIMES DAILY AS NEEDED FOR ANXIETY   60 tablet   2   . fluticasone (FLONASE) 50 MCG/ACT nasal spray   Each Nare   Place 2 sprays into both nostrils daily.   16 g   2   . omeprazole (PRILOSEC) 20 MG capsule   Oral   Take 20 mg by mouth 2 (two) times daily before a meal.          . oxyCODONE (ROXICODONE) 5 MG immediate release tablet   Oral   Take 1-2 tablets (5-10 mg total) by mouth every 4 (four) hours as needed for severe pain.   40 tablet   0   . phentermine 37.5 MG capsule   Oral   Take 1 capsule (37.5 mg total) by mouth daily.   30 capsule   0   . sertraline (ZOLOFT) 50 MG tablet   Oral   Take 1 tablet (50 mg total) by mouth daily.   30 tablet   3     Allergies Review of patient's allergies indicates no known allergies.  Family History  Problem Relation Age of Onset  . Hypertension Mother   . Hyperlipidemia Mother   . Diabetes Mother   . Cancer Mother     breast cancer; 04/2011  . Colon polyps Mother   . Hyperlipidemia Father   . Hypertension Father   . Colon polyps Father   . Hypertension Sister   . Hypertension Brother   . Cancer Maternal Uncle     terminal lung cancer    Social History Social History  Substance Use Topics  . Smoking status: Former Smoker -- 0.00 packs/day for 8 years    Types: Cigarettes    Quit date: 06/04/1999  . Smokeless tobacco: Never Used  . Alcohol Use: Yes     Comment: 1 drink every few months    Review of Systems Constitutional: Endorses  fever/chills Eyes: No visual changes. ENT: Endorses "scratchy" throat. Nurse's mild nasal congestion. Cardiovascular: Denies chest pain. Respiratory: Denies shortness of breath. Endorses cough, wheezing. Gastrointestinal: No abdominal pain.  No nausea, no vomiting.  No diarrhea.  No constipation. Genitourinary: Negative for dysuria. Musculoskeletal: Negative for back pain. Skin: Negative for rash. Neurological: Negative for headaches, focal weakness or numbness.  10-point ROS otherwise negative.  ____________________________________________  PHYSICAL EXAM:  VITAL SIGNS: ED Triage Vitals  Enc Vitals Group     BP 05/10/15 0933 119/80 mmHg     Pulse Rate 05/10/15 0933 104     Resp 05/10/15 0933 18     Temp 05/10/15 0933 99.3 F (37.4 C)     Temp Source 05/10/15 0933 Oral     SpO2 05/10/15 0933 100 %     Weight 05/10/15 0933 211 lb (95.709 kg)     Height 05/10/15 0933 5\' 9"  (1.753 m)     Head Cir --      Peak Flow --      Pain Score 05/10/15 0934 3     Pain Loc --      Pain Edu? --      Excl. in [place]? --     Constitutional: Alert and oriented. Well appearing and in no acute distress. Eyes: Conjunctivae are normal. PERRL. EOMI. Head: Atraumatic. Nose: No congestion/rhinnorhea. Mouth/Throat: Mucous membranes are moist.  Oropharynx non-erythematous. Neck: No stridor.   Hematological/Lymphatic/Immunilogical: Diffuse, mobile, nontender anterior cervical lymphadenopathy. Cardiovascular: Normal rate, regular rhythm. Grossly normal heart sounds.  Good peripheral circulation. Respiratory: Normal respiratory effort.  No retractions. Lungs with coarse breath sounds, wheezing, and mild rhonchi to right lower lobe. No other adventitious lung sounds. Good air entry into the bases. Gastrointestinal: Soft and nontender. No distention. No abdominal bruits. No CVA tenderness. Musculoskeletal: No lower extremity tenderness nor edema.  No joint effusions. Neurologic:  Normal speech and  language. No gross focal neurologic deficits are appreciated. No gait instability. Skin:  Skin is warm, dry and intact. No rash noted. Psychiatric: Mood and affect are normal. Speech and behavior are normal.  ____________________________________________   LABS (all labs ordered are listed, but only abnormal results are displayed)  Labs Reviewed  COMPREHENSIVE METABOLIC PANEL - Abnormal; Notable for the following:    ALT 11 (*)    All other components within normal limits  CBC WITH DIFFERENTIAL/PLATELET   ____________________________________________  EKG   ____________________________________________  RADIOLOGY  Chest x-ray Impression: Right middle lobe pneumonia.   I personally reviewed the films. ____________________________________________   PROCEDURES  Procedure(s) performed: None  Critical Care performed: No  ____________________________________________   INITIAL IMPRESSION / ASSESSMENT AND PLAN / ED COURSE  Pertinent labs & imaging results that were available during my care of the patient were reviewed by me and considered in my medical decision making (see chart for details).  Patient's diagnosis is consistent with right middle lobe pneumonia. Patient's laboratory results returned without any market abnormality. Chest x-ray returns visualizing right middle lobe pneumonia. Patient is given IV Levaquin here in the emergency department and discharged home with oral Levaquin prescription. Patient is to use previously prescribed albuterol inhaler for symptomatic control. Patient is also to take Tylenol and ibuprofen. I have advised the patient to follow-up with primary care in one month for repeat chest x-ray. Patient verbalizes understanding of diagnosis and treatment plan and verbalizes compliance with same. ____________________________________________   FINAL CLINICAL IMPRESSION(S) / ED DIAGNOSES  Final diagnoses:  Community acquired pneumonia  Right middle  lobe pneumonia      Darletta Moll, PA-C 05/30/15 1533  Carrie Mew, MD 05/30/15 2228

## 2015-05-11 NOTE — Telephone Encounter (Signed)
Pt caled saying she went to the [facility] ER yesterday.  They did a chest x ray.  She did have pneumonia.  They gave her IV and then sent her home with Leviquin.  She is suppose to get another xray in month.  I have made her a follow up appt. With you.  Her call back is [phone]  Thanks Con Memos

## 2015-05-14 NOTE — Telephone Encounter (Signed)
Acknowledged. Recheck sooner if needed.

## 2015-05-15 ENCOUNTER — Ambulatory Visit: Payer: 59 | Admitting: Family Medicine

## 2015-05-15 ENCOUNTER — Telehealth: Payer: Self-pay | Admitting: Family Medicine

## 2015-05-15 NOTE — Telephone Encounter (Signed)
Pt is requesting a call back.  Pt has questions about her FMLA forms.  CE:[id number]

## 2015-05-16 NOTE — Telephone Encounter (Signed)
LMTCB

## 2015-05-16 NOTE — Telephone Encounter (Signed)
Patient states that she received a e-mail from [employer] stating that her start date for medical leave should say 04/22/2015 instead of 04/28/2015 due to initial visit being at the Urgent Care on 11/19. Misty Zamora is aware and states he will contact [employer] for more information about what they are requesting.

## 2015-05-17 ENCOUNTER — Telehealth: Payer: Self-pay | Admitting: Family Medicine

## 2015-05-17 NOTE — Telephone Encounter (Signed)
Pt called back to make sure the forms for [employer] had been redone and faxed back. Pt stated that her job depends on this being faxed back today. Please advise. Thanks TNP

## 2015-05-25 ENCOUNTER — Telehealth: Payer: Self-pay | Admitting: Family Medicine

## 2015-05-25 NOTE — Telephone Encounter (Signed)
This is a pt of Dennis. She has been trying to get forms faxed to [employer] to excuse her from work and to mu understanding Simona Huh has filled out multiple forms and sent them. Pt stated that the last form still wasn't correct and she would like to speak with a nurse tomorrow if possible to advise what needs to be changed. Pt stated she needs it completed before 06/04/15 and Simona Huh is out of the office until 06/05/15. Please advise. Thanks TNP

## 2015-05-26 ENCOUNTER — Inpatient Hospital Stay: Payer: 59

## 2015-05-26 ENCOUNTER — Other Ambulatory Visit: Payer: 59

## 2015-05-26 ENCOUNTER — Ambulatory Visit: Payer: 59

## 2015-05-30 NOTE — Telephone Encounter (Signed)
Pt is calling back regarding the forms that were faxed to [employer].  She has to have this paperwork faxed correctly. She says it has to be done by Jan 4th inorder to be processed.  It also needs to state her next fu appt date on the form,   Can some one please contact patient.  He call back is [phone].  Thanks, [employer]

## 2015-05-31 ENCOUNTER — Other Ambulatory Visit: Payer: Self-pay | Admitting: Family Medicine

## 2015-05-31 ENCOUNTER — Other Ambulatory Visit: Payer: Self-pay

## 2015-05-31 DIAGNOSIS — J4540 Moderate persistent asthma, uncomplicated: Secondary | ICD-10-CM

## 2015-05-31 NOTE — Telephone Encounter (Signed)
No pharmacy on this message. OK to send prescription of Augmentin 875 bid x 10 day to pharmacy of her choice.

## 2015-05-31 NOTE — Telephone Encounter (Signed)
I spoke with patient earlier trying to fix her FMLA forms for [place]. During conversation she states that she started to develop same symptoms again when she has seen Simona Huh and gone to ER when they diagnosed her with pneumonia-ache, fatigue, same symptoms she said. Is there anyway she can get treated again before she gets worse and end up in ER or does she need to be seen since Simona Huh is the only one that has been following her for this? Please review-aa

## 2015-05-31 NOTE — Telephone Encounter (Signed)
Pt is returning call.  CB#[phone]/MW °

## 2015-05-31 NOTE — Telephone Encounter (Signed)
[id number]-aa 

## 2015-05-31 NOTE — Telephone Encounter (Signed)
Spoke with patient and addressing this-aa

## 2015-06-01 MED ORDER — AMOXICILLIN-POT CLAVULANATE 875-125 MG PO TABS
1.0000 | ORAL_TABLET | Freq: Two times a day (BID) | ORAL | Status: DC
Start: 2015-06-01 — End: 2015-06-08

## 2015-06-01 NOTE — Telephone Encounter (Signed)
Pt called you back at 2:57.  Please call her back [phone]  Thanks, Con Memos

## 2015-06-01 NOTE — Telephone Encounter (Signed)
Done, sorry pharmacy was put in already in meds and order. RX sent in-aa

## 2015-06-01 NOTE — Telephone Encounter (Signed)
Talked to the patient and got information that needs to be corrected on the form, got new forms to fills out, tried calling Simona Huh to see if he will come by to sign but was not able to get in touch with him, will put in information that i can and leave the rest for dennis.

## 2015-06-01 NOTE — Telephone Encounter (Signed)
Returned patient's call. Patient just wanted to advise Simona Huh that [facility] paperwork is due on 06/08/2015. Left a voicemail for Presley Raddle- [employer] examiner to return call to verify exactly what information that she needs corrected on FMLA paperwork. Have a note that Wilhemena Durie wrote that states what patient requested the paperwork state.

## 2015-06-02 NOTE — Telephone Encounter (Signed)
Will review forms and correct as Misty Zamora - [employer] examiner advises. Plan to have everything ready by 06-07-14 the actual day of her follow up appointment.

## 2015-06-06 ENCOUNTER — Telehealth: Payer: Self-pay | Admitting: Family Medicine

## 2015-06-06 NOTE — Telephone Encounter (Signed)
Spoke with patient.

## 2015-06-06 NOTE — Telephone Encounter (Signed)
Spoke with Presley Raddle (pt's FMLA case worker with [employer]) Per Lattie Haw patient's initial FMLA paperwork that Simona Huh completed was approved, but patient exceeded absences as listed on FMLA forms. Lattie Haw also stated that form was completed correctly by Simona Huh, and writing in the margin was accepted. Lattie Haw states patient has a letter from [employer] that states which days and times she missed work. Patient is scheduled to come in for a OV on 06/08/15.

## 2015-06-06 NOTE — Telephone Encounter (Signed)
Pt is requesting a call back.  CE:[id number]

## 2015-06-06 NOTE — Telephone Encounter (Signed)
I was helping patient with her FMLA form since Dr. Rosanna Randy was not here. Lexine Baton and Simona Huh know what is going on with it, I am working with Dr. Rosanna Randy and will let them handle it. Thank you-aa

## 2015-06-06 NOTE — Telephone Encounter (Signed)
Left patient a voicemail advising her that Simona Huh is aware FMLA forms are due on 06/08/2015, and per Simona Huh forms will be completed. Patient does have a FU OV with Dennis on 1/5.

## 2015-06-06 NOTE — Telephone Encounter (Signed)
Pt called requesting to speak with Ana. I advised that [place] isn't usually with and before I could finish pt stated that Wilhemena Durie was working on something for her and that is why she would like to follow up with [place]. Thanks TNP

## 2015-06-07 ENCOUNTER — Ambulatory Visit
Admission: RE | Admit: 2015-06-07 | Discharge: 2015-06-07 | Disposition: A | Discharge status: Home / Self Care | Payer: 59 | Source: Ambulatory Visit | Attending: Family Medicine | Admitting: Family Medicine

## 2015-06-07 ENCOUNTER — Other Ambulatory Visit: Payer: Self-pay

## 2015-06-07 ENCOUNTER — Inpatient Hospital Stay: Payer: 59

## 2015-06-07 ENCOUNTER — Inpatient Hospital Stay: Payer: 59 | Attending: Internal Medicine

## 2015-06-07 DIAGNOSIS — M5126 Other intervertebral disc displacement, lumbar region: Secondary | ICD-10-CM | POA: Insufficient documentation

## 2015-06-07 DIAGNOSIS — J189 Pneumonia, unspecified organism: Secondary | ICD-10-CM | POA: Insufficient documentation

## 2015-06-07 DIAGNOSIS — Z09 Encounter for follow-up examination after completed treatment for conditions other than malignant neoplasm: Secondary | ICD-10-CM | POA: Diagnosis not present

## 2015-06-07 DIAGNOSIS — M5416 Radiculopathy, lumbar region: Secondary | ICD-10-CM | POA: Diagnosis not present

## 2015-06-07 DIAGNOSIS — M5116 Intervertebral disc disorders with radiculopathy, lumbar region: Secondary | ICD-10-CM | POA: Insufficient documentation

## 2015-06-08 ENCOUNTER — Encounter: Payer: Self-pay | Admitting: Family Medicine

## 2015-06-08 ENCOUNTER — Ambulatory Visit (INDEPENDENT_AMBULATORY_CARE_PROVIDER_SITE_OTHER): Payer: 59 | Admitting: Family Medicine

## 2015-06-08 ENCOUNTER — Other Ambulatory Visit: Payer: Self-pay | Admitting: Family Medicine

## 2015-06-08 VITALS — BP 128/84 | HR 67 | Temp 98.3°F | Resp 14 | Wt 212.2 lb

## 2015-06-08 DIAGNOSIS — J189 Pneumonia, unspecified organism: Secondary | ICD-10-CM

## 2015-06-08 DIAGNOSIS — Z1231 Encounter for screening mammogram for malignant neoplasm of breast: Secondary | ICD-10-CM

## 2015-06-08 DIAGNOSIS — J181 Lobar pneumonia, unspecified organism: Principal | ICD-10-CM

## 2015-06-08 NOTE — Progress Notes (Addendum)
Patient ID: Misty Zamora, female   DOB: Jul 26, 1975, 40 y.o.   MRN: [id number]   Patient: Misty Zamora Female    DOB: June 19, 1975   40 y.o.   MRN: [id number] Visit Date: 06/08/2015  Today's Provider: Vernie Murders, PA   Chief Complaint  Patient presents with  . Pneumonia  . Follow-up   Subjective:    Pneumonia She complains of chest tightness and cough. The current episode started more than 1 month ago (follow up). The problem has been gradually improving. Improvement on treatment: Started having chills nasal congestion, bodyaches and diagnosed with a viral syndrome on 04-22-15. Was authorized to return to work by 04-24-15. On 04-28-15 she presented to [facility] with diagnosis of acute asthmatic bronchitis.   Was treated with Augmentin, Hycodan, Prednisone taper and Proventil. Was placed out of work until 05-01-15. Recheck appointment on 05-05-15 showed much improvement and she was released to return to work as she was nearly finished with the antibiotic and prednisone. By 05-10-15 she had recurrence and worsening of symptoms. She went to the [facility]ER and CXR showed RML pneumonia. She was given IV Levofloxacin and discharged with oral Levofloxacin for a week with expectation of being out of work for 3 more days. By 05-17-15 she developed nausea and vomiting around 2 pm at work. She was sent home by her supervisor and returned to work on 05-19-15. Feeling much improved today and slowly regaining energy and endurance. Patient Active Problem List   Diagnosis Date Noted  . Neuritis or radiculitis due to rupture of lumbar intervertebral disc 06/07/2015  . 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 Obesity 04/07/2015  . Allergic rhinitis 02/18/2015  . Anxiety 02/18/2015  . H/O renal calculi 02/18/2015  . 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 Multinodular goiter 01/02/2015  . Status post laparoscopic hysterectomy 12/27/2014  . Chronic female pelvic pain 12/08/2014  . IDA (iron deficiency anemia)  12/01/2014  . Abnormal LFTs 01/29/2012   Past Surgical History  Procedure Laterality Date  . Gallbladder removed    . Laproscopy    . Colonoscopy  2010  . Renal endoscopy via nephrostomy / pyelostomy  2010  . Tubal ligation    . Gastric bypass  2012  . Dilation and curettage of uterus    . Elbow surgery Left   . Laparoscopic hysterectomy N/A 12/27/2014    Procedure: HYSTERECTOMY TOTAL LAPAROSCOPIC;  Surgeon: Will Bonnet, MD;  Location: [facility];  Service: Gynecology;  Laterality: N/A;  . Bilateral salpingectomy Bilateral 12/27/2014    Procedure: BILATERAL SALPINGECTOMY;  Surgeon: Will Bonnet, MD;  Location: [facility];  Service: Gynecology;  Laterality: Bilateral;  . Cystoscopy N/A 12/27/2014    Procedure: CYSTOSCOPY;  Surgeon: Will Bonnet, MD;  Location: [facility];  Service: Gynecology;  Laterality: N/A;  . Elbow arthroscopy Left 03/30/2015    Procedure: ARTHROSCOPY ELBOW WITH DEBRIDEMENT OF THE SYMPTOMATIC PLICA;  Surgeon: Corky Mull, MD;  Location: [place];  Service: Orthopedics;  Laterality: Left;  . Abdominal hysterectomy  12/2014    ovaries in situ   Family History  Problem Relation Age of Onset  . Hypertension Mother   . Hyperlipidemia Mother   . Diabetes Mother   . Cancer Mother     breast cancer; 04/2011  . Colon polyps Mother   . Hyperlipidemia Father   . Hypertension Father   . Colon polyps Father   . Hypertension Sister   . Hypertension Brother   . Cancer Maternal Uncle  terminal lung cancer   No Known Allergies   Previous Medications   ACETAMINOPHEN (TYLENOL) 500 MG TABLET    Take 2 tablets (1,000 mg total) by mouth every 6 (six) hours.   ALBUTEROL (PROVENTIL HFA;VENTOLIN HFA) 108 (90 BASE) MCG/ACT INHALER    Inhale 2 puffs into the lungs every 6 (six) hours as needed for wheezing or shortness of breath.   CLONAZEPAM (KLONOPIN) 0.5 MG TABLET    TAKE 1 TABLET BY MOUTH 2 TIMES DAILY AS NEEDED FOR ANXIETY   FLUTICASONE (FLONASE) 50  MCG/ACT NASAL SPRAY    Place 2 sprays into both nostrils daily.   OMEPRAZOLE (PRILOSEC) 20 MG CAPSULE    Take 20 mg by mouth 2 (two) times daily before a meal.    OXYCODONE (ROXICODONE) 5 MG IMMEDIATE RELEASE TABLET    Take 1-2 tablets (5-10 mg total) by mouth every 4 (four) hours as needed for severe pain.   PHENTERMINE 37.5 MG CAPSULE    Take 1 capsule (37.5 mg total) by mouth daily.   SERTRALINE (ZOLOFT) 50 MG TABLET    Take 1 tablet (50 mg total) by mouth daily.    Review of Systems  Constitutional: Negative.   HENT: Positive for congestion.   Eyes: Negative.   Respiratory: Positive for cough.   Cardiovascular: Negative.   Gastrointestinal: Negative.   Endocrine: Negative.   Genitourinary: Negative.   Musculoskeletal: Negative.   Skin: Negative.   Allergic/Immunologic: Negative.   Neurological: Negative.   Hematological: Negative.   Psychiatric/Behavioral: Negative.     Social History  Substance Use Topics  . Smoking status: Former Smoker -- 0.00 packs/day for 8 years    Types: Cigarettes    Quit date: 06/04/1999  . Smokeless tobacco: Never Used  . Alcohol Use: Yes     Comment: 1 drink every few months   Objective:   BP 128/84 mmHg  Pulse 67  Temp(Src) 98.3 F (36.8 C) (Oral)  Resp 14  Wt 212 lb 3.2 oz (96.253 kg)  SpO2 98%  LMP 12/14/2014 (Exact Date)  Physical Exam  Constitutional: She is oriented to person, place, and time. She appears well-developed and well-nourished. No distress.  HENT:  Head: Normocephalic and atraumatic.  Right Ear: Hearing normal.  Left Ear: Hearing normal.  Nose: Nose normal.  Eyes: Conjunctivae and lids are normal. Right eye exhibits no discharge. Left eye exhibits no discharge. No scleral icterus.  Neck: Normal range of motion. Neck supple.  Cardiovascular: Normal rate and regular rhythm.   Pulmonary/Chest: Effort normal and breath sounds normal. No respiratory distress.  Abdominal: Soft. Bowel sounds are normal.    Musculoskeletal: Normal range of motion.  Neurological: She is alert and oriented to person, place, and time.  Skin: Skin is intact. No lesion and no rash noted.  Psychiatric: She has a normal mood and affect. Her speech is normal and behavior is normal. Thought content normal.      Assessment & Plan:     1. RML pneumonia Had finished the Levofloxacin on 05-17-15. No longer having much cough or sputum production. No longer using cough syrup or albuterol. Has returned to full duties at work after each incident but it seemed to her employer that she was out of work the whole time because she work 12 hour shifts 3 days a week. If she is out of work for 3 days at the right time, then she is not seen on the job for a week. Will write another report for her FMLA  forms to up date employer of her status.

## 2015-06-09 DIAGNOSIS — M67822 Other specified disorders of synovium, left elbow: Secondary | ICD-10-CM | POA: Diagnosis not present

## 2015-06-16 ENCOUNTER — Ambulatory Visit: Admission: RE | Admit: 2015-06-16 | Payer: 59 | Source: Ambulatory Visit

## 2015-06-20 ENCOUNTER — Telehealth: Payer: Self-pay | Admitting: Family Medicine

## 2015-06-20 NOTE — Telephone Encounter (Signed)
Left patient a voicemail advising her that Simona Huh is out of the office this week, and to call back on Monday to see if [facility] faxed paperwork back to [employer].

## 2015-06-20 NOTE — Telephone Encounter (Signed)
Pt called asking about her paperwork and if it has been sent in .  Claims examiner sent paper work here on the 11th.  Her call back is [phone]  Thanks ,Con Memos

## 2015-06-23 ENCOUNTER — Ambulatory Visit
Admission: RE | Admit: 2015-06-23 | Discharge: 2015-06-23 | Disposition: A | Discharge status: Home / Self Care | Payer: 59 | Source: Ambulatory Visit | Attending: Family Medicine | Admitting: Family Medicine

## 2015-06-23 DIAGNOSIS — Z1231 Encounter for screening mammogram for malignant neoplasm of breast: Secondary | ICD-10-CM | POA: Insufficient documentation

## 2015-06-26 ENCOUNTER — Emergency Department: Payer: 59

## 2015-06-26 ENCOUNTER — Encounter: Payer: Self-pay | Admitting: Emergency Medicine

## 2015-06-26 ENCOUNTER — Emergency Department
Admission: EM | Admit: 2015-06-26 | Discharge: 2015-06-26 | Disposition: A | Discharge status: Home / Self Care | Payer: 59 | Attending: Emergency Medicine | Admitting: Emergency Medicine

## 2015-06-26 DIAGNOSIS — R070 Pain in throat: Secondary | ICD-10-CM | POA: Diagnosis not present

## 2015-06-26 DIAGNOSIS — Z79899 Other long term (current) drug therapy: Secondary | ICD-10-CM | POA: Diagnosis not present

## 2015-06-26 DIAGNOSIS — R0989 Other specified symptoms and signs involving the circulatory and respiratory systems: Secondary | ICD-10-CM | POA: Diagnosis not present

## 2015-06-26 DIAGNOSIS — F419 Anxiety disorder, unspecified: Secondary | ICD-10-CM | POA: Insufficient documentation

## 2015-06-26 DIAGNOSIS — Z87891 Personal history of nicotine dependence: Secondary | ICD-10-CM | POA: Diagnosis not present

## 2015-06-26 DIAGNOSIS — J392 Other diseases of pharynx: Secondary | ICD-10-CM | POA: Diagnosis not present

## 2015-06-26 DIAGNOSIS — R221 Localized swelling, mass and lump, neck: Secondary | ICD-10-CM | POA: Diagnosis present

## 2015-06-26 DIAGNOSIS — Z7951 Long term (current) use of inhaled steroids: Secondary | ICD-10-CM | POA: Insufficient documentation

## 2015-06-26 DIAGNOSIS — I10 Essential (primary) hypertension: Secondary | ICD-10-CM | POA: Diagnosis not present

## 2015-06-26 LAB — CBC
HCT: 40.8 % (ref 35.0–47.0)
Hemoglobin: 13.5 g/dL (ref 12.0–16.0)
MCH: 28.7 pg (ref 26.0–34.0)
MCHC: 33.1 g/dL (ref 32.0–36.0)
MCV: 86.8 fL (ref 80.0–100.0)
PLATELETS: 224 10*3/uL (ref 150–440)
RBC: 4.7 MIL/uL (ref 3.80–5.20)
RDW: 14.3 % (ref 11.5–14.5)
WBC: 7.7 10*3/uL (ref 3.6–11.0)

## 2015-06-26 LAB — TROPONIN I: Troponin I: <0.03 ng/mL (ref <0.031)

## 2015-06-26 LAB — BASIC METABOLIC PANEL
Anion gap: 8 (ref 5–15)
BUN: 8 mg/dL (ref 6–20)
CHLORIDE: 108 mmol/L (ref 101–111)
CO2: 25 mmol/L (ref 22–32)
CREATININE: 0.61 mg/dL (ref 0.44–1.00)
Calcium: 9.2 mg/dL (ref 8.9–10.3)
GFR calc Af Amer: >60 mL/min (ref >60)
Glucose, Bld: 68 mg/dL (ref 65–99)
Potassium: 4 mmol/L (ref 3.5–5.1)
SODIUM: 141 mmol/L (ref 135–145)

## 2015-06-26 LAB — TSH: TSH: 0.65 u[IU]/mL (ref 0.350–4.500)

## 2015-06-26 MED ORDER — PREDNISONE 20 MG PO TABS: 60.0000 mg | ORAL_TABLET | Freq: Every day | ORAL | Status: DC

## 2015-06-26 MED ORDER — PREDNISONE 20 MG PO TABS
60.0000 mg | ORAL_TABLET | Freq: Once | ORAL | Status: AC
  Administered 2015-06-26: 60 mg via ORAL
  Filled 2015-06-26: qty 3

## 2015-06-26 MED ORDER — IOHEXOL 300 MG/ML  SOLN
75.0000 mL | Freq: Once | INTRAMUSCULAR | Status: AC | PRN
  Administered 2015-06-26: 75 mL via INTRAVENOUS

## 2015-06-26 NOTE — ED Provider Notes (Addendum)
[facility] Emergency Department Provider Note  ____________________________________________   I have reviewed the triage vital signs and the nursing notes.   HISTORY  Chief Complaint Sore Throat and Shortness of Breath    HPI Misty Zamora is a 40 y.o. female with multiple medical problems including depression, anxiety, gastric bypass, hysterectomy, reflux disease, and she states she once had thyroid nodules but there were extensively worked up and told that they were normal.The patient states that over the last week she felt like maybe she had some swelling to the right side of her neck when she looked in the mirror and then today she states that it felt like her throat might start to close. She denies having difficult to getting air through but she states she became very anxious. She states she started having a panic attack when she thought about her neck. She states that her heart rate went up to 153. Patient is much calmer upon arrival here. She denies any hives or tongue swelling with swelling difficulty speaking or breathing. She states that she knew she was having a panic attack but she is not sure what else was happening. She denies any fever, runny nose, cough. She states she's is very anxious can seem like the right side of her neck was better than the left.  Past Medical History  Diagnosis Date  . History of cardiovascular stress test 2005    showed MR and TR  . Depression   . Anxiety   . PONV (postoperative nausea and vomiting)     hypotension with epidural  . Hypertension     no meds since gastric bypass  . Heart murmur 2005  . Sleep apnea     was better after gastric bypass, hasd recently started snoring again.  . Anemia     had iron infusions. last one in Sept. told levels are now normal  . GERD (gastroesophageal reflux disease)     occasional  . Bulging lumbar disc     causes bilateral leg pain  . Bulge of cervical disc without myelopathy    . History of kidney stones   . Wears contact lenses     Patient Active Problem List   Diagnosis Date Noted  . Neuritis or radiculitis due to rupture of lumbar intervertebral disc 06/07/2015  . 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 Obesity 04/07/2015  . Allergic rhinitis 02/18/2015  . Anxiety 02/18/2015  . H/O renal calculi 02/18/2015  . 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 Multinodular goiter 01/02/2015  . Status post laparoscopic hysterectomy 12/27/2014  . Chronic female pelvic pain 12/08/2014  . IDA (iron deficiency anemia) 12/01/2014  . Abnormal LFTs 01/29/2012    Past Surgical History  Procedure Laterality Date  . Gallbladder removed    . Laproscopy    . Colonoscopy  2010  . Renal endoscopy via nephrostomy / pyelostomy  2010  . Tubal ligation    . Gastric bypass  2012  . Dilation and curettage of uterus    . Elbow surgery Left   . Laparoscopic hysterectomy N/A 12/27/2014    Procedure: HYSTERECTOMY TOTAL LAPAROSCOPIC;  Surgeon: Will Bonnet, MD;  Location: [facility];  Service: Gynecology;  Laterality: N/A;  . Bilateral salpingectomy Bilateral 12/27/2014    Procedure: BILATERAL SALPINGECTOMY;  Surgeon: Will Bonnet, MD;  Location: [facility];  Service: Gynecology;  Laterality: Bilateral;  . Cystoscopy N/A 12/27/2014    Procedure: CYSTOSCOPY;  Surgeon: Will Bonnet, MD;  Location:  [facility];  Service: Gynecology;  Laterality: N/A;  . Elbow arthroscopy Left 03/30/2015    Procedure: ARTHROSCOPY ELBOW WITH DEBRIDEMENT OF THE SYMPTOMATIC PLICA;  Surgeon: Corky Mull, MD;  Location: [place];  Service: Orthopedics;  Laterality: Left;  . Abdominal hysterectomy  12/2014    ovaries in situ    Current Outpatient Rx  Name  Route  Sig  Dispense  Refill  . acetaminophen (TYLENOL) 500 MG tablet   Oral   Take 2 tablets (1,000 mg total) by mouth every 6 (six) hours.   30 tablet   0   . albuterol (PROVENTIL HFA;VENTOLIN HFA) 108 (90 BASE) MCG/ACT  inhaler   Inhalation   Inhale 2 puffs into the lungs every 6 (six) hours as needed for wheezing or shortness of breath.   1 Inhaler   0   . clonazePAM (KLONOPIN) 0.5 MG tablet      TAKE 1 TABLET BY MOUTH 2 TIMES DAILY AS NEEDED FOR ANXIETY   60 tablet   2   . fluticasone (FLONASE) 50 MCG/ACT nasal spray   Each Nare   Place 2 sprays into both nostrils daily.   16 g   2   . omeprazole (PRILOSEC) 20 MG capsule   Oral   Take 20 mg by mouth 2 (two) times daily before a meal.          . oxyCODONE (ROXICODONE) 5 MG immediate release tablet   Oral   Take 1-2 tablets (5-10 mg total) by mouth every 4 (four) hours as needed for severe pain.   40 tablet   0   . phentermine 37.5 MG capsule   Oral   Take 1 capsule (37.5 mg total) by mouth daily.   30 capsule   0   . sertraline (ZOLOFT) 50 MG tablet   Oral   Take 1 tablet (50 mg total) by mouth daily.   30 tablet   3     Allergies Review of patient's allergies indicates no known allergies.  Family History  Problem Relation Age of Onset  . Hypertension Mother   . Hyperlipidemia Mother   . Diabetes Mother   . Cancer Mother     breast cancer; 04/2011  . Colon polyps Mother   . Breast cancer Mother 94  . Hyperlipidemia Father   . Hypertension Father   . Colon polyps Father   . Hypertension Sister   . Hypertension Brother   . Cancer Maternal Uncle     terminal lung cancer  . Breast cancer Maternal Aunt   . Breast cancer Paternal Aunt   . Breast cancer Paternal Grandmother     Social History Social History  Substance Use Topics  . Smoking status: Former Smoker -- 0.00 packs/day for 8 years    Types: Cigarettes    Quit date: 06/04/1999  . Smokeless tobacco: Never Used  . Alcohol Use: Yes     Comment: 1 drink every few months    Review of Systems Constitutional: No fever/chills Eyes: No visual changes. ENT: No sore throat. No stiff neck no neck pain Cardiovascular: Denies chest pain. Respiratory: Denies  shortness of breath. Gastrointestinal:   no vomiting.  No diarrhea.  No constipation. Genitourinary: Negative for dysuria. Musculoskeletal: Negative lower extremity swelling Skin: Negative for rash. Neurological: Negative for headaches, focal weakness or numbness. 10-point ROS otherwise negative.  ____________________________________________   PHYSICAL EXAM:  VITAL SIGNS: ED Triage Vitals  Enc Vitals Group     BP 06/26/15 1527  129/57 mmHg     Pulse Rate 06/26/15 1527 77     Resp 06/26/15 1527 18     Temp 06/26/15 1527 98.7 F (37.1 C)     Temp Source 06/26/15 1527 Oral     SpO2 06/26/15 1527 100 %     Weight 06/26/15 1527 200 lb (90.719 kg)     Height 06/26/15 1527 5\' 9"  (1.753 m)     Head Cir --      Peak Flow --      Pain Score 06/26/15 1539 0     Pain Loc --      Pain Edu? --      Excl. in [place]? --     Constitutional: Alert and oriented. Well appearing and in no acute distress. She is but otherwise nontoxic Eyes: Conjunctivae are normal. PERRL. EOMI. Head: Atraumatic. Nose: No congestion/rhinnorhea. Mouth/Throat: Mucous membranes are moist.  Oropharynx non-erythematous. Neck: No stridor.   Nontender with no meningismus of adenopathy no irregularities no nodularity no asymmetry. Voice is completely normal oropharynx is complaining normal no angioedema noted. Cardiovascular: Normal rate, regular rhythm. Grossly normal heart sounds.  Good peripheral circulation. Respiratory: Normal respiratory effort.  No retractions. Lungs CTAB. Abdominal: Soft and nontender. No distention. No guarding no rebound Back:  There is no focal tenderness or step off there is no midline tenderness there are no lesions noted. there is no CVA tenderness Musculoskeletal: No lower extremity tenderness. No joint effusions, no DVT signs strong distal pulses no edema Neurologic:  Normal speech and language. No gross focal neurologic deficits are appreciated.  Skin:  Skin is warm, dry and intact. No rash  noted. Psychiatric: Mood and affect are normal. Speech and behavior are normal.  ____________________________________________   LABS (all labs ordered are listed, but only abnormal results are displayed)  Labs Reviewed  BASIC METABOLIC PANEL  CBC  TROPONIN I  TSH   ____________________________________________  EKG  I personally interpreted any EKGs ordered by me or triage Normal sinus rhythm rate 71 bpm no acute ST or vision no acute ST depression normal axis unremarkable EKG ____________________________________________  RADIOLOGY  I reviewed any imaging ordered by me or triage that were performed during my shift ____________________________________________   PROCEDURES  Procedure(s) performed: None  Critical Care performed: None  ____________________________________________   INITIAL IMPRESSION / ASSESSMENT AND PLAN / ED COURSE  Pertinent labs & imaging results that were available during my care of the patient were reviewed by me and considered in my medical decision making (see chart for details).  Patient complaining of anxiety, and the sensation of the right side of her neck is very little left. Unclear exam and did not appreciate anything but I did do a CT scan which is also negative. Blood work is reassuring vital signs are reassuring she has no complaints at this time. There is no evidence of anaphylaxis bowel syndrome on steroids for a few days to ensure that there is no subclinical pathology that I am not noticing. Certainly no evidence of angioedema and anaphylaxis retropharyngeal abscess or peritonsillar abscess thyroid nodule goiter or other acute surgical pathology. She is neurologic intact. His likely the patient was quite anxious and upset about the fact that it seemed to her that the right side of her neck without finger in the mid and the left at this time I do not see any other significant pathology to pursue. Return precautions and follow-up very well  explained and understood  ----------------------------------------- 8:22 PM on 06/26/2015 -----------------------------------------  Have observed the patient for multiple hours here in the emergency department. No evidence of angioedema and no evidence of throat swelling no evidence of hoarse voice no evidence of progression of symptoms patient states her symptoms are completely gone at this time. We will discharge her home with close outpatient follow-up with ENT and PCP. Return precautions and follow-up are understood ____________________________________________   FINAL CLINICAL IMPRESSION(S) / ED DIAGNOSES  Final diagnoses:  None     Schuyler Amor, MD 06/26/15 [place], MD 06/26/15 2023

## 2015-06-26 NOTE — ED Notes (Signed)
Patient states feeling like throat is swelling, shortness of breath since 1300.  Patient states put pulse ox on at home and sats were 94%  Heart rate was 103-153.

## 2015-06-26 NOTE — ED Notes (Signed)
POCT preg discontinued - patient had a hysterectomy.

## 2015-06-26 NOTE — ED Notes (Signed)
Brought over from [facility] with some diff swallowing and rapid heartrate

## 2015-06-26 NOTE — Telephone Encounter (Signed)
Pt is returning call.  CB#[phone]/MW °

## 2015-06-26 NOTE — Discharge Instructions (Signed)
Return to the emergency room if have any new or worrisome symptoms including difficulty breathing or throat swelling. This would include abdominal pain and chest pain or shortness of breath. If you have significant symptoms call 911.

## 2015-06-26 NOTE — ED Notes (Signed)
Patient does take clonazepam for anxiety but states that this is different. States some abdominal pain for the last few days. Denies N/V/D.

## 2015-06-28 DIAGNOSIS — R12 Heartburn: Secondary | ICD-10-CM | POA: Diagnosis not present

## 2015-06-28 DIAGNOSIS — K660 Peritoneal adhesions (postprocedural) (postinfection): Secondary | ICD-10-CM | POA: Diagnosis not present

## 2015-06-28 DIAGNOSIS — E161 Other hypoglycemia: Secondary | ICD-10-CM | POA: Diagnosis not present

## 2015-06-28 DIAGNOSIS — R1012 Left upper quadrant pain: Secondary | ICD-10-CM | POA: Diagnosis not present

## 2015-07-05 ENCOUNTER — Other Ambulatory Visit
Admission: RE | Admit: 2015-07-05 | Discharge: 2015-07-05 | Disposition: A | Discharge status: Home / Self Care | Payer: 59 | Source: Ambulatory Visit | Attending: Bariatrics | Admitting: Bariatrics

## 2015-07-05 DIAGNOSIS — Z9884 Bariatric surgery status: Secondary | ICD-10-CM | POA: Insufficient documentation

## 2015-07-05 LAB — CBC WITH DIFFERENTIAL/PLATELET
BASOS ABS: 0.1 10*3/uL (ref 0–0.1)
Basophils Relative: 1 %
EOS ABS: 0.1 10*3/uL (ref 0–0.7)
EOS PCT: 2 %
HCT: 37.1 % (ref 35.0–47.0)
Hemoglobin: 12.4 g/dL (ref 12.0–16.0)
LYMPHS ABS: 1.8 10*3/uL (ref 1.0–3.6)
Lymphocytes Relative: 37 %
MCH: 28.9 pg (ref 26.0–34.0)
MCHC: 33.4 g/dL (ref 32.0–36.0)
MCV: 86.4 fL (ref 80.0–100.0)
MONO ABS: 0.3 10*3/uL (ref 0.2–0.9)
Monocytes Relative: 6 %
Neutro Abs: 2.6 10*3/uL (ref 1.4–6.5)
Neutrophils Relative %: 54 %
PLATELETS: 186 10*3/uL (ref 150–440)
RBC: 4.3 MIL/uL (ref 3.80–5.20)
RDW: 14.1 % (ref 11.5–14.5)
WBC: 4.8 10*3/uL (ref 3.6–11.0)

## 2015-07-05 LAB — COMPREHENSIVE METABOLIC PANEL
ALK PHOS: 44 U/L (ref 38–126)
ALT: 14 U/L (ref 14–54)
ANION GAP: 4 — AB (ref 5–15)
AST: 14 U/L — ABNORMAL LOW (ref 15–41)
Albumin: 3.8 g/dL (ref 3.5–5.0)
BUN: 8 mg/dL (ref 6–20)
CHLORIDE: 106 mmol/L (ref 101–111)
CO2: 27 mmol/L (ref 22–32)
CREATININE: 0.6 mg/dL (ref 0.44–1.00)
Calcium: 8.7 mg/dL — ABNORMAL LOW (ref 8.9–10.3)
Glucose, Bld: 79 mg/dL (ref 65–99)
Potassium: 3.6 mmol/L (ref 3.5–5.1)
SODIUM: 137 mmol/L (ref 135–145)
TOTAL PROTEIN: 6.7 g/dL (ref 6.5–8.1)
Total Bilirubin: 0.7 mg/dL (ref 0.3–1.2)

## 2015-07-05 LAB — MAGNESIUM: Magnesium: 1.9 mg/dL (ref 1.7–2.4)

## 2015-07-05 LAB — HEMOGLOBIN A1C: Hgb A1c MFr Bld: 5.1 % (ref 4.0–6.0)

## 2015-07-05 LAB — TSH: TSH: 1.239 u[IU]/mL (ref 0.350–4.500)

## 2015-07-05 LAB — IRON: IRON: 89 ug/dL (ref 28–170)

## 2015-07-05 LAB — FERRITIN: FERRITIN: 7 ng/mL — AB (ref 11–307)

## 2015-07-05 LAB — FOLATE: FOLATE: 8 ng/mL (ref >5.9)

## 2015-07-06 LAB — VITAMIN B12: Vitamin B-12: 193 pg/mL (ref 180–914)

## 2015-07-06 LAB — PREALBUMIN: Prealbumin: 17.7 mg/dL — ABNORMAL LOW (ref 18–38)

## 2015-07-07 ENCOUNTER — Other Ambulatory Visit: Payer: Self-pay | Admitting: Bariatrics

## 2015-07-07 DIAGNOSIS — G4730 Sleep apnea, unspecified: Secondary | ICD-10-CM | POA: Diagnosis not present

## 2015-07-07 DIAGNOSIS — Z9049 Acquired absence of other specified parts of digestive tract: Secondary | ICD-10-CM | POA: Diagnosis not present

## 2015-07-07 DIAGNOSIS — Z87891 Personal history of nicotine dependence: Secondary | ICD-10-CM | POA: Diagnosis not present

## 2015-07-07 DIAGNOSIS — K219 Gastro-esophageal reflux disease without esophagitis: Secondary | ICD-10-CM | POA: Diagnosis not present

## 2015-07-07 DIAGNOSIS — F329 Major depressive disorder, single episode, unspecified: Secondary | ICD-10-CM | POA: Diagnosis not present

## 2015-07-07 DIAGNOSIS — Z9884 Bariatric surgery status: Secondary | ICD-10-CM | POA: Diagnosis not present

## 2015-07-07 DIAGNOSIS — F419 Anxiety disorder, unspecified: Secondary | ICD-10-CM | POA: Diagnosis not present

## 2015-07-07 DIAGNOSIS — R1033 Periumbilical pain: Secondary | ICD-10-CM | POA: Diagnosis not present

## 2015-07-07 DIAGNOSIS — R1084 Generalized abdominal pain: Secondary | ICD-10-CM | POA: Diagnosis not present

## 2015-07-07 DIAGNOSIS — K838 Other specified diseases of biliary tract: Secondary | ICD-10-CM | POA: Diagnosis not present

## 2015-07-07 DIAGNOSIS — K660 Peritoneal adhesions (postprocedural) (postinfection): Secondary | ICD-10-CM

## 2015-07-07 DIAGNOSIS — R110 Nausea: Secondary | ICD-10-CM | POA: Diagnosis not present

## 2015-07-07 DIAGNOSIS — R1012 Left upper quadrant pain: Secondary | ICD-10-CM | POA: Diagnosis not present

## 2015-07-07 DIAGNOSIS — Z8711 Personal history of peptic ulcer disease: Secondary | ICD-10-CM | POA: Diagnosis not present

## 2015-07-07 DIAGNOSIS — Z9851 Tubal ligation status: Secondary | ICD-10-CM | POA: Diagnosis not present

## 2015-07-10 LAB — COPPER, SERUM: Copper: 148 ug/dL (ref 72–166)

## 2015-07-10 LAB — VITAMIN A: VITAMIN A (RETINOIC ACID): 32 ug/dL (ref 20–65)

## 2015-07-10 LAB — VITAMIN D 25 HYDROXY (VIT D DEFICIENCY, FRACTURES): Vit D, 25-Hydroxy: 10 ng/mL — ABNORMAL LOW (ref 30.0–100.0)

## 2015-07-10 LAB — VITAMIN B1: VITAMIN B1 (THIAMINE): 110.9 nmol/L (ref 66.5–200.0)

## 2015-07-10 LAB — PTH, INTACT AND CALCIUM
CALCIUM TOTAL (PTH): 8.9 mg/dL (ref 8.7–10.2)
PTH: 104 pg/mL — ABNORMAL HIGH (ref 15–65)

## 2015-07-10 LAB — ZINC: ZINC: 85 ug/dL (ref 56–134)

## 2015-07-10 LAB — VITAMIN E: Alpha-Tocopherol: 7.2 mg/L (ref 5.3–16.8)

## 2015-07-10 LAB — VITAMIN K1, SERUM: VITAMIN K1: 0.38 ng/mL (ref 0.13–1.88)

## 2015-07-11 ENCOUNTER — Encounter: Payer: Self-pay | Admitting: Family Medicine

## 2015-07-11 ENCOUNTER — Ambulatory Visit: Payer: 59

## 2015-07-11 ENCOUNTER — Ambulatory Visit (INDEPENDENT_AMBULATORY_CARE_PROVIDER_SITE_OTHER): Payer: 59 | Admitting: Family Medicine

## 2015-07-11 VITALS — BP 102/74 | HR 70 | Temp 97.7°F | Resp 16 | Wt 215.8 lb

## 2015-07-11 DIAGNOSIS — Z79899 Other long term (current) drug therapy: Secondary | ICD-10-CM | POA: Diagnosis not present

## 2015-07-11 DIAGNOSIS — R1013 Epigastric pain: Secondary | ICD-10-CM

## 2015-07-11 DIAGNOSIS — Z87891 Personal history of nicotine dependence: Secondary | ICD-10-CM | POA: Diagnosis not present

## 2015-07-11 DIAGNOSIS — Z87442 Personal history of urinary calculi: Secondary | ICD-10-CM | POA: Diagnosis not present

## 2015-07-11 DIAGNOSIS — Z9884 Bariatric surgery status: Secondary | ICD-10-CM | POA: Diagnosis not present

## 2015-07-11 DIAGNOSIS — R1011 Right upper quadrant pain: Secondary | ICD-10-CM

## 2015-07-11 DIAGNOSIS — R110 Nausea: Secondary | ICD-10-CM | POA: Diagnosis not present

## 2015-07-11 DIAGNOSIS — Z5181 Encounter for therapeutic drug level monitoring: Secondary | ICD-10-CM | POA: Diagnosis not present

## 2015-07-11 NOTE — Patient Instructions (Signed)
F/u with [facility] and/or Dr. [facility] Salvia as scheduled.

## 2015-07-11 NOTE — Progress Notes (Signed)
Subjective:     Patient ID: Misty Zamora, female   DOB: September 16, 1975, 40 y.o.   MRN: [id number]  HPI  Chief Complaint  Patient presents with  . Follow-up    Patient comes into office for hospital follow up, patient was seen at [facility] on 07/07/15, with complaints of increased abdominal pain and nausea. Patient reports earlier that day she had a upper endoscopy, patient had informed specialist of pain she was having and was advised to go to ER. Patient states that CT that was doine in 12/2014 was compared to most recent and it showed that her bile duct had doubled in size up to 4mm. Patient would like to discuss today getting a referral to [facility] that she had followed up with her Bariatric specialist, Dr. [facility] Salvia, and had normal EGD. As nausea and pain worsened she was referred to [facility] ER. Continues to have nausea and epigastric to RUQ pain despite use of Zofran. Hx of cholecystectomy and gastric bypass. Abdominal CT showing new intrahepatic duct dilation along with CBD dilation.   Review of Systems  Gastrointestinal: Negative for vomiting and diarrhea (no change in bowel habits).       Objective:   Physical Exam  Constitutional: She appears well-developed and well-nourished. No distress.  Abdominal: Soft. There is tenderness (mild epigastric). There is no guarding.       Assessment:    1. Epigastric abdominal pain - Ambulatory referral to Gastroenterology  2. Right upper quadrant pain - Ambulatory referral to Gastroenterology    Plan:    Refer back to [facility] She also has appointment with her bariatric M.D. 2/8. She will report to [facility] ER if symptoms worsening prior to [facility] appointment.

## 2015-07-12 DIAGNOSIS — R110 Nausea: Secondary | ICD-10-CM | POA: Diagnosis not present

## 2015-07-12 DIAGNOSIS — R1011 Right upper quadrant pain: Secondary | ICD-10-CM | POA: Diagnosis not present

## 2015-07-13 ENCOUNTER — Other Ambulatory Visit: Payer: Self-pay | Admitting: Family Medicine

## 2015-07-13 ENCOUNTER — Telehealth: Payer: Self-pay | Admitting: Family Medicine

## 2015-07-13 DIAGNOSIS — Z13220 Encounter for screening for lipoid disorders: Secondary | ICD-10-CM

## 2015-07-13 NOTE — Telephone Encounter (Signed)
[id number] 

## 2015-07-13 NOTE — Telephone Encounter (Signed)
Please review previous order? Okay to resend? KW

## 2015-07-13 NOTE — Telephone Encounter (Signed)
Order for fasting lipid profile is in the [facility] computer.

## 2015-07-13 NOTE — Telephone Encounter (Signed)
Pt states she was suppose to have labs done back Oct./Nov but never had those labs done.  Pt is requesting a order sent to [facility] lab today if possible due to having an appointment with GI doctor on Monday.  EJ:[id number]

## 2015-07-13 NOTE — Telephone Encounter (Signed)
Patient states that at last visit she had discussed with you about getting her Lipid panel screening, she states that she was taken off of phentermine but hopes to get back on it in future and wants to know her general screening labs are. KW

## 2015-07-13 NOTE — Telephone Encounter (Signed)
I don't see the labs she is referring to. She had extensive labs this month per Dr. [facility] Salvia and additional ones by [facility].

## 2015-07-14 ENCOUNTER — Other Ambulatory Visit: Payer: Self-pay | Admitting: Family Medicine

## 2015-07-14 DIAGNOSIS — F419 Anxiety disorder, unspecified: Secondary | ICD-10-CM

## 2015-07-14 MED ORDER — CLONAZEPAM 0.5 MG PO TABS: ORAL_TABLET | ORAL | Status: DC

## 2015-07-14 NOTE — Telephone Encounter (Signed)
Patient has been advised. KW 

## 2015-07-17 DIAGNOSIS — R1013 Epigastric pain: Secondary | ICD-10-CM | POA: Diagnosis not present

## 2015-07-17 DIAGNOSIS — K838 Other specified diseases of biliary tract: Secondary | ICD-10-CM | POA: Diagnosis not present

## 2015-07-17 DIAGNOSIS — R112 Nausea with vomiting, unspecified: Secondary | ICD-10-CM | POA: Diagnosis not present

## 2015-07-20 ENCOUNTER — Inpatient Hospital Stay: Payer: 59 | Attending: Internal Medicine | Admitting: Internal Medicine

## 2015-07-20 ENCOUNTER — Encounter: Payer: Self-pay | Admitting: *Deleted

## 2015-07-20 VITALS — BP 137/86 | HR 111 | Temp 97.8°F | Wt 213.4 lb

## 2015-07-20 DIAGNOSIS — E538 Deficiency of other specified B group vitamins: Secondary | ICD-10-CM | POA: Insufficient documentation

## 2015-07-20 DIAGNOSIS — Z9884 Bariatric surgery status: Secondary | ICD-10-CM | POA: Diagnosis not present

## 2015-07-20 DIAGNOSIS — I10 Essential (primary) hypertension: Secondary | ICD-10-CM | POA: Insufficient documentation

## 2015-07-20 DIAGNOSIS — Z79899 Other long term (current) drug therapy: Secondary | ICD-10-CM | POA: Diagnosis not present

## 2015-07-20 DIAGNOSIS — G4730 Sleep apnea, unspecified: Secondary | ICD-10-CM | POA: Insufficient documentation

## 2015-07-20 DIAGNOSIS — K219 Gastro-esophageal reflux disease without esophagitis: Secondary | ICD-10-CM | POA: Diagnosis not present

## 2015-07-20 DIAGNOSIS — Z87891 Personal history of nicotine dependence: Secondary | ICD-10-CM | POA: Insufficient documentation

## 2015-07-20 DIAGNOSIS — D509 Iron deficiency anemia, unspecified: Secondary | ICD-10-CM | POA: Insufficient documentation

## 2015-07-20 DIAGNOSIS — Z87442 Personal history of urinary calculi: Secondary | ICD-10-CM | POA: Insufficient documentation

## 2015-07-20 DIAGNOSIS — F329 Major depressive disorder, single episode, unspecified: Secondary | ICD-10-CM | POA: Diagnosis not present

## 2015-07-20 NOTE — Progress Notes (Signed)
[facility] OFFICE PROGRESS NOTE  Patient Care Team: Margo Common, PA as PCP - General (Physician Assistant)   SUMMARY OF HEMATOLOGIC HISTORY:  # IRON DEFICIENCY ANEMIA sec to gastric bypass; On Main IV Iron.   INTERVAL HISTORY:  This is my first interaction with the patient since I joined the practice September 2016. I reviewed the patient's prior charts/pertinent labs; findings are summarized above.   40 year old female patient with above history of iron deficiency anemia secondary to malabsorption who has been on maintenance IV Venofer is here for follow-up. Patient stated that she missed her appointment/IV infusion 3 months ago for personal reasons.   Patient had recently followed up with her gastric surgeon- noted to have a ferritin 9/hemoglobin trending low at 12. Patient complains of extreme fatigue. She also complains of multiple problems including abdominal pain/bloating-for which she has appointment with the gastroenterologist. Patient otherwise denies any tingling and numbness in feet.  REVIEW OF SYSTEMS:  A complete 10 point review of system is done which is negative except mentioned above/history of present illness.   PAST MEDICAL HISTORY :  Past Medical History  Diagnosis Date  . History of cardiovascular stress test 2005    showed MR and TR  . Depression   . Anxiety   . PONV (postoperative nausea and vomiting)     hypotension with epidural  . Hypertension     no meds since gastric bypass  . Heart murmur 2005  . Sleep apnea     was better after gastric bypass, hasd recently started snoring again.  . Anemia     had iron infusions. last one in Sept. told levels are now normal  . GERD (gastroesophageal reflux disease)     occasional  . Bulging lumbar disc     causes bilateral leg pain  . 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 History of kidney stones   . Wears contact lenses   . IDA (iron deficiency anemia)     PAST SURGICAL HISTORY  :   Past Surgical History  Procedure Laterality Date  . Gallbladder removed    . Laproscopy    . Colonoscopy  2010  . Renal endoscopy via nephrostomy / pyelostomy  2010  . Tubal ligation    . Gastric bypass  2012  . Dilation and curettage of uterus    . Elbow surgery Left   . Laparoscopic hysterectomy N/A 12/27/2014    Procedure: HYSTERECTOMY TOTAL LAPAROSCOPIC;  Surgeon: Will Bonnet, MD;  Location: [facility];  Service: Gynecology;  Laterality: N/A;  . Bilateral salpingectomy Bilateral 12/27/2014    Procedure: BILATERAL SALPINGECTOMY;  Surgeon: Will Bonnet, MD;  Location: [facility];  Service: Gynecology;  Laterality: Bilateral;  . Cystoscopy N/A 12/27/2014    Procedure: CYSTOSCOPY;  Surgeon: Will Bonnet, MD;  Location: [facility];  Service: Gynecology;  Laterality: N/A;  . Elbow arthroscopy Left 03/30/2015    Procedure: ARTHROSCOPY ELBOW WITH DEBRIDEMENT OF THE SYMPTOMATIC PLICA;  Surgeon: Corky Mull, MD;  Location: [place];  Service: Orthopedics;  Laterality: Left;  . Abdominal hysterectomy  12/2014    ovaries in situ    FAMILY HISTORY :   Family History  Problem Relation Age of Onset  . Hypertension Mother   . Hyperlipidemia Mother   . Diabetes Mother   . Cancer Mother     breast cancer; 04/2011  . Colon polyps Mother   . Breast cancer Mother 47  . Hyperlipidemia Father   .  Hypertension Father   . Colon polyps Father   . Hypertension Sister   . Hypertension Brother   . Cancer Maternal Uncle     terminal lung cancer  . Breast cancer Maternal Aunt   . Breast cancer Paternal Aunt   . Breast cancer Paternal Grandmother     SOCIAL HISTORY:   Social History  Substance Use Topics  . Smoking status: Former Smoker -- 0.00 packs/day for 8 years    Types: Cigarettes    Quit date: 06/04/1999  . Smokeless tobacco: Never Used  . Alcohol Use: Yes     Comment: 1 drink every few months    ALLERGIES:  has No Known Allergies.  MEDICATIONS:  Current  Outpatient Prescriptions  Medication Sig Dispense Refill  . acetaminophen (TYLENOL) 500 MG tablet Take 2 tablets (1,000 mg total) by mouth every 6 (six) hours. 30 tablet 0  . amoxicillin-clavulanate (AUGMENTIN) 875-125 MG tablet   0  . clonazePAM (KLONOPIN) 0.5 MG tablet TAKE 1 TABLET BY MOUTH 2 TIMES DAILY AS NEEDED FOR ANXIETY 60 tablet 2  . HYDROcodone-acetaminophen (NORCO/VICODIN) 5-325 MG tablet Take by mouth.    Marland Kitchen omeprazole (PRILOSEC) 20 MG capsule Take 20 mg by mouth 2 (two) times daily before a meal.     . phentermine 37.5 MG capsule Take by mouth.     No current facility-administered medications for this visit.    PHYSICAL EXAMINATION:   BP 137/86 mmHg  Pulse 111  Temp(Src) 97.8 F (36.6 C) (Tympanic)  Wt 213 lb 6.5 oz (96.8 kg)  LMP 12/14/2014 (Exact Date)  Filed Weights   07/20/15 1149  Weight: 213 lb 6.5 oz (96.8 kg)    GENERAL: Well-nourished well-developed; Alert, no distress and comfortable.   Alone. EYES: no pallor or icterus OROPHARYNX: no thrush or ulceration; good dentition  NECK: supple, no masses felt LYMPH:  no palpable lymphadenopathy in the cervical, axillary or inguinal regions LUNGS: clear to auscultation and  No wheeze or crackles HEART/CVS: regular rate & rhythm and no murmurs; No lower extremity edema ABDOMEN:abdomen soft, non-tender and normal bowel sounds Musculoskeletal:no cyanosis of digits and no clubbing  PSYCH: alert & oriented x 3 with fluent speech NEURO: no focal motor/sensory deficits SKIN:  no rashes or significant lesions  LABORATORY DATA:  I have reviewed the data as listed    Component Value Date/Time   NA 137 07/05/2015 1256   NA 135* 06/21/2013 1718   K 3.6 07/05/2015 1256   K 3.9 06/21/2013 1718   CL 106 07/05/2015 1256   CL 104 06/21/2013 1718   CO2 27 07/05/2015 1256   CO2 28 06/21/2013 1718   GLUCOSE 79 07/05/2015 1256   GLUCOSE 83 06/21/2013 1718   BUN 8 07/05/2015 1256   BUN 5* 06/21/2013 1718   CREATININE  0.60 07/05/2015 1256   CREATININE 0.62 06/21/2013 1718   CALCIUM 8.9 07/05/2015 1300   CALCIUM 8.7* 07/05/2015 1256   CALCIUM 8.8 06/21/2013 1718   PROT 6.7 07/05/2015 1256   PROT 7.4 06/21/2013 1718   ALBUMIN 3.8 07/05/2015 1256   ALBUMIN 3.7 06/21/2013 1718   AST 14* 07/05/2015 1256   AST 20 06/21/2013 1718   ALT 14 07/05/2015 1256   ALT 17 06/21/2013 1718   ALKPHOS 44 07/05/2015 1256   ALKPHOS 58 06/21/2013 1718   BILITOT 0.7 07/05/2015 1256   BILITOT 0.4 06/21/2013 1718   GFRNONAA >60 07/05/2015 1256   GFRNONAA >60 06/21/2013 1718   GFRNONAA >60 08/26/2011 2310  GFRAA >60 07/05/2015 1256   GFRAA >60 06/21/2013 1718   GFRAA >60 08/26/2011 2310    No results found for: SPEP, UPEP  Lab Results  Component Value Date   WBC 4.8 07/05/2015   NEUTROABS 2.6 07/05/2015   HGB 12.4 07/05/2015   HCT 37.1 07/05/2015   MCV 86.4 07/05/2015   PLT 186 07/05/2015      Chemistry      Component Value Date/Time   NA 137 07/05/2015 1256   NA 135* 06/21/2013 1718   K 3.6 07/05/2015 1256   K 3.9 06/21/2013 1718   CL 106 07/05/2015 1256   CL 104 06/21/2013 1718   CO2 27 07/05/2015 1256   CO2 28 06/21/2013 1718   BUN 8 07/05/2015 1256   BUN 5* 06/21/2013 1718   CREATININE 0.60 07/05/2015 1256   CREATININE 0.62 06/21/2013 1718      Component Value Date/Time   CALCIUM 8.9 07/05/2015 1300   CALCIUM 8.7* 07/05/2015 1256   CALCIUM 8.8 06/21/2013 1718   ALKPHOS 44 07/05/2015 1256   ALKPHOS 58 06/21/2013 1718   AST 14* 07/05/2015 1256   AST 20 06/21/2013 1718   ALT 14 07/05/2015 1256   ALT 17 06/21/2013 1718   BILITOT 0.7 07/05/2015 1256   BILITOT 0.4 06/21/2013 1718      ASSESSMENT & PLAN:   # IRON DEFICIENCY ANEMIA- 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 Patient's most recent hemoglobin is 12/but ferritin is 9. Patient is symptomatic with multiple symptoms including fatigue/ possible functional iron deficiency. I recommend IV iron 500 mg of Feraheme  Now;  and then plan maintenance Feraheme in approximate 4 months/ at next visit. Patient will have labs done-CBC ferritin few days prior to visit.   # low vitamin D levels- recommend vitamin D 5000 units once a week.  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   # Patient will have labs done-CBC ferritin few days prior to visit/ in appx 4 m   Orders Placed This Encounter  Procedures  . CBC with Differential    Standing Status: Future     Number of Occurrences:      Standing Expiration Date: 07/19/2016  . Ferritin    Standing Status: Future     Number of Occurrences:      Standing Expiration Date: 07/19/2016   All questions were answered. The patient knows to call the clinic with any problems, questions or concerns.       Cammie Sickle, MD 07/20/2015 12:09 PM

## 2015-07-21 ENCOUNTER — Inpatient Hospital Stay: Payer: 59

## 2015-07-24 ENCOUNTER — Inpatient Hospital Stay: Payer: 59

## 2015-07-24 VITALS — BP 111/75 | HR 81 | Temp 98.3°F

## 2015-07-24 DIAGNOSIS — Z79899 Other long term (current) drug therapy: Secondary | ICD-10-CM | POA: Diagnosis not present

## 2015-07-24 DIAGNOSIS — D509 Iron deficiency anemia, unspecified: Secondary | ICD-10-CM | POA: Diagnosis not present

## 2015-07-24 DIAGNOSIS — F329 Major depressive disorder, single episode, unspecified: Secondary | ICD-10-CM | POA: Diagnosis not present

## 2015-07-24 DIAGNOSIS — G4730 Sleep apnea, unspecified: Secondary | ICD-10-CM | POA: Diagnosis not present

## 2015-07-24 DIAGNOSIS — K219 Gastro-esophageal reflux disease without esophagitis: Secondary | ICD-10-CM | POA: Diagnosis not present

## 2015-07-24 DIAGNOSIS — Z9884 Bariatric surgery status: Secondary | ICD-10-CM | POA: Diagnosis not present

## 2015-07-24 DIAGNOSIS — Z87442 Personal history of urinary calculi: Secondary | ICD-10-CM | POA: Diagnosis not present

## 2015-07-24 DIAGNOSIS — E538 Deficiency of other specified B group vitamins: Secondary | ICD-10-CM | POA: Diagnosis not present

## 2015-07-24 DIAGNOSIS — I10 Essential (primary) hypertension: Secondary | ICD-10-CM | POA: Diagnosis not present

## 2015-07-24 MED ORDER — SODIUM CHLORIDE 0.9 % IV SOLN
Freq: Once | INTRAVENOUS | Status: AC
  Administered 2015-07-24: 12:00:00 via INTRAVENOUS
  Filled 2015-07-24: qty 1000

## 2015-07-24 MED ORDER — SODIUM CHLORIDE 0.9 % IV SOLN
510.0000 mg | Freq: Once | INTRAVENOUS | Status: AC
  Administered 2015-07-24: 510 mg via INTRAVENOUS
  Filled 2015-07-24: qty 17

## 2015-07-25 ENCOUNTER — Encounter: Payer: Self-pay | Admitting: Cardiovascular Disease

## 2015-07-25 ENCOUNTER — Ambulatory Visit (INDEPENDENT_AMBULATORY_CARE_PROVIDER_SITE_OTHER): Payer: 59 | Admitting: Cardiovascular Disease

## 2015-07-25 VITALS — BP 112/72 | HR 78 | Ht 69.0 in | Wt 210.5 lb

## 2015-07-25 DIAGNOSIS — R079 Chest pain, unspecified: Secondary | ICD-10-CM

## 2015-07-25 DIAGNOSIS — R0602 Shortness of breath: Secondary | ICD-10-CM

## 2015-07-25 DIAGNOSIS — R000 Tachycardia, unspecified: Secondary | ICD-10-CM | POA: Diagnosis not present

## 2015-07-25 DIAGNOSIS — E041 Nontoxic single thyroid nodule: Secondary | ICD-10-CM | POA: Diagnosis not present

## 2015-07-25 DIAGNOSIS — E211 Secondary hyperparathyroidism, not elsewhere classified: Secondary | ICD-10-CM | POA: Diagnosis not present

## 2015-07-25 MED ORDER — PROPRANOLOL HCL 10 MG PO TABS: 10.0000 mg | ORAL_TABLET | Freq: Three times a day (TID) | ORAL | Status: DC | PRN

## 2015-07-25 NOTE — Assessment & Plan Note (Addendum)
Etiology of her tachycardia is unclear Uncertain if she is having periods of sinus tachycardia at present after eating and with exertion, Unable to exclude atrial tachycardia that presents acutely Symptoms are happening frequently but not on a daily basis We have ordered a 30 day monitor Otherwise EKG and clinical exam are benign suggesting no structural heart disease We have given her propranolol to take as needed if symptoms persist Recommended she take 10 mg and increase the dose up to 20 mg if needed   Total encounter time more than 60 minutes  Greater than 50% was spent in counseling and coordination of care with the patient

## 2015-07-25 NOTE — Patient Instructions (Addendum)
Please take propranolol as needed for tachycardia (1 to 2 pills)  Monitor your blood pressure after you eat  We will order a 30 day monitor for tachycardia, shortness of breath  Please call us if you have new issues that need to be addressed before your next appt.  Your physician wants you to follow-up in: 6 weeks   Your physician has recommended that you wear an event monitor. Event monitors are medical devices that record the heart's electrical activity. Doctors most often [place] these monitors to diagnose arrhythmias. Arrhythmias are problems with the speed or rhythm of the heartbeat. The monitor is a small, portable device. You can wear one while you do your normal daily activities. This is usually used to diagnose what is causing palpitations/syncope (passing out).

## 2015-07-25 NOTE — Progress Notes (Signed)
Patient ID: Misty Zamora, female    DOB: 06/08/1975, 40 y.o.   MRN: [id number]  HPI Comments: Misty Zamora is a very pleasant 40 year old woman with history of obesity, gastric bypass surgery, previously seen in 2012 for lightheadedness/orthostasis, palpitations, shortness of breath who presents today by self-referral to reestablish care in the [facility] office for symptoms of tachycardia. She is a patient of Dr. Vernie Murders  She reports that she lost significant amount of weight after her gastric bypass surgery down to 165 pounds. Weight has since climbed back up to 210 pounds Now when she eats she gets abdominal discomfort at times, not on a regular basis She is concerned about tachycardia that will present at times, often as she is eating or after she eats She reports having heart rate up to 120, sometimes 150 bpm Also reports having severe tachycardia with any exertion at the gym, will reach heart rates up to 200 Unclear if heart rate presents acutely or more slowly. She uses a pulse oximeter to measure her heart rate. She does not know what her blood pressure is after she eats.  She's been having problems also with low iron, low vitamin D. Previous iron infusions Low pre-albumin level  Other lab work reviewed with her showing normal thyroid  EKG on today's visit shows normal sinus rhythm with rate 78 bpm, no significant ST or T-wave changes  Other past medical history reviewed She used to smoke but stopped in 2001. She smoked for total of 8 years  She was seen in the emergency room on March 04, 2011 for palpitations. Workup was negative including negative chest x-ray, blood work. TSH 0.7. EKG was essentially normal       No Known Allergies  Current Outpatient Prescriptions on File Prior to Visit  Medication Sig Dispense Refill  . clonazePAM (KLONOPIN) 0.5 MG tablet TAKE 1 TABLET BY MOUTH 2 TIMES DAILY AS NEEDED FOR ANXIETY 60 tablet 2  . HYDROcodone-acetaminophen  (NORCO/VICODIN) 5-325 MG tablet Take by mouth.    Marland Kitchen omeprazole (PRILOSEC) 20 MG capsule Take 20 mg by mouth 2 (two) times daily before a meal.      No current facility-administered medications on file prior to visit.    Past Medical History  Diagnosis Date  . History of cardiovascular stress test 2005    showed MR and TR  . Depression   . Anxiety   . PONV (postoperative nausea and vomiting)     hypotension with epidural  . Hypertension     no meds since gastric bypass  . Heart murmur 2005  . Sleep apnea     was better after gastric bypass, hasd recently started snoring again.  . Anemia     had iron infusions. last one in Sept. told levels are now normal  . GERD (gastroesophageal reflux disease)     occasional  . Bulging lumbar disc     causes bilateral leg pain  . 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 History of kidney stones   . Wears contact lenses   . IDA (iron deficiency anemia)     Past Surgical History  Procedure Laterality Date  . Gallbladder removed    . Laproscopy    . Colonoscopy  2010  . Renal endoscopy via nephrostomy / pyelostomy  2010  . Tubal ligation    . Gastric bypass  2012  . Dilation and curettage of uterus    . Elbow surgery Left   . Laparoscopic hysterectomy  N/A 12/27/2014    Procedure: HYSTERECTOMY TOTAL LAPAROSCOPIC;  Surgeon: Will Bonnet, MD;  Location: [facility];  Service: Gynecology;  Laterality: N/A;  . Bilateral salpingectomy Bilateral 12/27/2014    Procedure: BILATERAL SALPINGECTOMY;  Surgeon: Will Bonnet, MD;  Location: [facility];  Service: Gynecology;  Laterality: Bilateral;  . Cystoscopy N/A 12/27/2014    Procedure: CYSTOSCOPY;  Surgeon: Will Bonnet, MD;  Location: [facility];  Service: Gynecology;  Laterality: N/A;  . Elbow arthroscopy Left 03/30/2015    Procedure: ARTHROSCOPY ELBOW WITH DEBRIDEMENT OF THE SYMPTOMATIC PLICA;  Surgeon: Corky Mull, MD;  Location: [facility];  Service: Orthopedics;   Laterality: Left;  . Abdominal hysterectomy  12/2014    ovaries in situ    Social History  reports that she quit smoking about 16 years ago. Her smoking use included Cigarettes. She smoked 0.00 packs per day for 8 years. She has never used smokeless tobacco. She reports that she drinks alcohol. She reports that she does not use illicit drugs.  Family History family history includes Breast cancer in her maternal aunt, paternal aunt, and paternal grandmother; Breast cancer (age of onset: 54) in her mother; Cancer in her maternal uncle and mother; Colon polyps in her father and mother; Diabetes in her mother; Hyperlipidemia in her father and mother; Hypertension in her brother, father, mother, and sister.  Review of Systems  Constitutional: Negative.   Respiratory: Negative.   Cardiovascular: Positive for palpitations.       Tachycardia  Gastrointestinal: Negative.   Musculoskeletal: Negative.   Neurological: Negative.   Hematological: Negative.   Psychiatric/Behavioral: Negative.   All other systems reviewed and are negative.   BP 112/72 mmHg  Pulse 78  Ht 5\' 9"  (1.753 m)  Wt 210 lb 8 oz (95.482 kg)  BMI 31.07 kg/m2  LMP 12/14/2014 (Exact Date)   Physical Exam  Constitutional: She is oriented to person, place, and time. She appears well-developed and well-nourished.  HENT:  Head: Normocephalic.  Nose: Nose normal.  Mouth/Throat: Oropharynx is clear and moist.  Eyes: Conjunctivae are normal. Pupils are equal, round, and reactive to light.  Neck: Normal range of motion. Neck supple. No JVD present.  Cardiovascular: Normal rate, regular rhythm, normal heart sounds and intact distal pulses.  Exam reveals no gallop and no friction rub.   No murmur heard. Pulmonary/Chest: Effort normal and breath sounds normal. No respiratory distress. She has no wheezes. She has no rales. She exhibits no tenderness.  Abdominal: Soft. Bowel sounds are normal. She exhibits no distension. There is no  tenderness.  Musculoskeletal: Normal range of motion. She exhibits no edema or tenderness.  Lymphadenopathy:    She has no cervical adenopathy.  Neurological: She is alert and oriented to person, place, and time. Coordination normal.  Skin: Skin is warm and dry. No rash noted. No erythema.  Psychiatric: She has a normal mood and affect. Her behavior is normal. Judgment and thought content normal.

## 2015-08-02 DIAGNOSIS — R112 Nausea with vomiting, unspecified: Secondary | ICD-10-CM | POA: Diagnosis not present

## 2015-08-02 DIAGNOSIS — R1013 Epigastric pain: Secondary | ICD-10-CM | POA: Diagnosis not present

## 2015-08-02 DIAGNOSIS — Z9889 Other specified postprocedural states: Secondary | ICD-10-CM | POA: Diagnosis not present

## 2015-08-02 DIAGNOSIS — K838 Other specified diseases of biliary tract: Secondary | ICD-10-CM | POA: Diagnosis not present

## 2015-08-03 DIAGNOSIS — R1013 Epigastric pain: Secondary | ICD-10-CM | POA: Diagnosis not present

## 2015-08-04 DIAGNOSIS — Z9884 Bariatric surgery status: Secondary | ICD-10-CM | POA: Diagnosis not present

## 2015-08-04 DIAGNOSIS — R109 Unspecified abdominal pain: Secondary | ICD-10-CM | POA: Diagnosis not present

## 2015-08-04 DIAGNOSIS — Z87891 Personal history of nicotine dependence: Secondary | ICD-10-CM | POA: Diagnosis not present

## 2015-08-04 DIAGNOSIS — K660 Peritoneal adhesions (postprocedural) (postinfection): Secondary | ICD-10-CM | POA: Diagnosis not present

## 2015-08-04 DIAGNOSIS — R1013 Epigastric pain: Secondary | ICD-10-CM | POA: Diagnosis not present

## 2015-08-07 ENCOUNTER — Ambulatory Visit (INDEPENDENT_AMBULATORY_CARE_PROVIDER_SITE_OTHER): Payer: 59

## 2015-08-07 DIAGNOSIS — K834 Spasm of sphincter of Oddi: Secondary | ICD-10-CM | POA: Diagnosis not present

## 2015-08-07 DIAGNOSIS — R0602 Shortness of breath: Secondary | ICD-10-CM | POA: Diagnosis not present

## 2015-08-07 DIAGNOSIS — R079 Chest pain, unspecified: Secondary | ICD-10-CM

## 2015-08-07 DIAGNOSIS — Z86010 Personal history of colonic polyps: Secondary | ICD-10-CM | POA: Diagnosis not present

## 2015-08-07 DIAGNOSIS — K648 Other hemorrhoids: Secondary | ICD-10-CM | POA: Diagnosis not present

## 2015-08-07 DIAGNOSIS — Z9884 Bariatric surgery status: Secondary | ICD-10-CM | POA: Diagnosis not present

## 2015-08-07 DIAGNOSIS — R000 Tachycardia, unspecified: Secondary | ICD-10-CM

## 2015-08-07 DIAGNOSIS — R109 Unspecified abdominal pain: Secondary | ICD-10-CM | POA: Diagnosis not present

## 2015-08-07 DIAGNOSIS — R1013 Epigastric pain: Secondary | ICD-10-CM | POA: Diagnosis not present

## 2015-08-07 DIAGNOSIS — K838 Other specified diseases of biliary tract: Secondary | ICD-10-CM | POA: Diagnosis not present

## 2015-08-11 ENCOUNTER — Telehealth: Payer: Self-pay | Admitting: Cardiovascular Disease

## 2015-08-11 NOTE — Telephone Encounter (Signed)
Pt calling stating since last time she was here she's not feeling well.  She had ER CP  Pt was doing pre surgery assessment Monday and they kind of scared her, they told her she looks to have a "Bundle branch block". Would like to talk to someone about this, she is very worried.

## 2015-08-11 NOTE — Telephone Encounter (Signed)
Patient called in because she was concerned about an EKG that was done during a recent ERCP on 08/07/2015 at [facility] I was unable to find an EKG in care everywhere to see what she was referring to but according to the one that was done here in our office it appeared to be normal. I let her know that sometimes lead placement for those tests can certainly affect the results but that her previous EKG here in our office looked great. She is currently wearing a 30 day monitor and she wanted to know if we were to see something serious would we know about that. Reassured her and let her know that they do in fact contact us if something shows up and we in turn would notify her. She currently denies any chest pain or shortness of breath. Instructed her that if she should start having any of those symptoms to call or go to the local ER. She verbalized understanding of all instructions and had no other questions. Let her know to please call us back if she should have any further questions or problems.

## 2015-08-14 NOTE — Telephone Encounter (Signed)
I suspect EKG was read as right bundle branch block Relatively benign phenomenon, previous EKGs suggesting borderline right bundle Likely of no clinical concern. We can discuss with her in follow-up No further testing at this time, we will watch the 30 day monitor

## 2015-08-17 ENCOUNTER — Ambulatory Visit: Payer: 59 | Admitting: Pain Medicine

## 2015-08-17 ENCOUNTER — Ambulatory Visit: Payer: 59 | Admitting: Family Medicine

## 2015-08-18 ENCOUNTER — Inpatient Hospital Stay: Payer: 59 | Attending: Internal Medicine

## 2015-08-18 ENCOUNTER — Inpatient Hospital Stay: Payer: 59

## 2015-08-21 ENCOUNTER — Telehealth: Payer: Self-pay | Admitting: Family Medicine

## 2015-08-21 ENCOUNTER — Other Ambulatory Visit: Payer: Self-pay | Admitting: Family Medicine

## 2015-08-21 DIAGNOSIS — R1013 Epigastric pain: Secondary | ICD-10-CM | POA: Diagnosis not present

## 2015-08-21 DIAGNOSIS — D509 Iron deficiency anemia, unspecified: Secondary | ICD-10-CM

## 2015-08-21 DIAGNOSIS — R112 Nausea with vomiting, unspecified: Secondary | ICD-10-CM | POA: Diagnosis not present

## 2015-08-21 DIAGNOSIS — R109 Unspecified abdominal pain: Secondary | ICD-10-CM

## 2015-08-21 DIAGNOSIS — K838 Other specified diseases of biliary tract: Secondary | ICD-10-CM | POA: Diagnosis not present

## 2015-08-21 MED ORDER — HYDROCODONE-ACETAMINOPHEN 5-325 MG PO TABS
1.0000 | ORAL_TABLET | Freq: Four times a day (QID) | ORAL | Status: DC | PRN
Start: 2015-08-21 — End: 2015-09-04

## 2015-08-21 NOTE — Telephone Encounter (Signed)
Advised patient as below. Patient reports that she did have the procedure done on 3/6 as reported. She reports that they should be sending you info.

## 2015-08-21 NOTE — Telephone Encounter (Signed)
Please review. Thanks!  

## 2015-08-21 NOTE — Telephone Encounter (Signed)
Pt stated we referred her to Dr. Harl Bowie with [facility]. Pt stated that she went there today and did a procedure and she is in a lot of pain. Pt stated that she was advised by Dr. Azucena Kuba that she would have to contact [facility] to get an RX for pain medication. Pt stated she wasn't sure why they wouldn't write the RX. Pt would like to get something today if possible. Please advise. Thanks TNP

## 2015-08-21 NOTE — Telephone Encounter (Signed)
I have put rx for hydrocodone up front for pickup. I have not received Dr. Nelly Laurence report yet. Was the ERCP done on 3/6 as mentioned in the original consult note?

## 2015-08-23 ENCOUNTER — Encounter: Payer: Self-pay | Admitting: Family Medicine

## 2015-09-01 ENCOUNTER — Telehealth: Payer: Self-pay | Admitting: Family Medicine

## 2015-09-01 NOTE — Telephone Encounter (Signed)
Patient is requesting a refill on Hydrocodone, she states that she is currently being followed by [facility] for abdominal pain and she has been taking her pill twice daily because she has also been experiencing sciatica and back pain. Her provider at [facility] told her that she would have to speak with her PCP doctor to get pain medication filled. I advised patient that you will be on Vacation until Monday 09/04/15. KW

## 2015-09-02 ENCOUNTER — Other Ambulatory Visit: Payer: Self-pay | Admitting: Family Medicine

## 2015-09-04 ENCOUNTER — Other Ambulatory Visit: Payer: Self-pay | Admitting: Family Medicine

## 2015-09-04 DIAGNOSIS — R109 Unspecified abdominal pain: Secondary | ICD-10-CM

## 2015-09-04 MED ORDER — HYDROCODONE-ACETAMINOPHEN 5-325 MG PO TABS: 1.0000 | ORAL_TABLET | Freq: Four times a day (QID) | ORAL | Status: DC | PRN

## 2015-09-04 NOTE — Telephone Encounter (Signed)
Patient was advised she states that Dr. Sharlet Salina told her she would now have to go follow up with pain clinic. Patient states that earliest appointment at pain clinic is in May. KW

## 2015-09-04 NOTE — Telephone Encounter (Signed)
Pain medication is available for pickup. Are you due to go  back to Dr. Sharlet Salina for back injections?

## 2015-09-05 ENCOUNTER — Ambulatory Visit: Payer: 59 | Admitting: Cardiovascular Disease

## 2015-09-13 ENCOUNTER — Telehealth: Payer: Self-pay | Admitting: *Deleted

## 2015-09-13 DIAGNOSIS — D509 Iron deficiency anemia, unspecified: Secondary | ICD-10-CM

## 2015-09-13 NOTE — Telephone Encounter (Signed)
I called patient back and she states that she is so extremely tired and has a hard time working, states the other iron she had worked better. She missed her 3/17 appt for lab, md and iron inf stating she had a death in the family. She is asking if she can get B12 inj to see if it will help give her energy, level drawn by PCP was 193. She has agreed to come in for lab draw tomorrow morning as she has to work this afternoon

## 2015-09-13 NOTE — Telephone Encounter (Signed)
-----   Message from Cammie Sickle, MD sent at 09/13/2015  1:05 PM EDT ----- Patient called regarding iron deficiency/feeling weak- please call pt to check/ at [phone]; order CBC CMP and studies ferritin. Thx

## 2015-09-13 NOTE — Telephone Encounter (Signed)
Per Dr B, PCP to handle B12 injection question. Left message on pt VM regarding B 12

## 2015-09-14 ENCOUNTER — Telehealth: Payer: Self-pay | Admitting: *Deleted

## 2015-09-14 ENCOUNTER — Inpatient Hospital Stay: Payer: 59 | Attending: Internal Medicine

## 2015-09-14 DIAGNOSIS — E538 Deficiency of other specified B group vitamins: Secondary | ICD-10-CM | POA: Insufficient documentation

## 2015-09-14 DIAGNOSIS — R110 Nausea: Secondary | ICD-10-CM | POA: Diagnosis not present

## 2015-09-14 DIAGNOSIS — E669 Obesity, unspecified: Secondary | ICD-10-CM | POA: Diagnosis not present

## 2015-09-14 DIAGNOSIS — Z87442 Personal history of urinary calculi: Secondary | ICD-10-CM | POA: Diagnosis not present

## 2015-09-14 DIAGNOSIS — Z90710 Acquired absence of both cervix and uterus: Secondary | ICD-10-CM | POA: Diagnosis not present

## 2015-09-14 DIAGNOSIS — D509 Iron deficiency anemia, unspecified: Secondary | ICD-10-CM | POA: Insufficient documentation

## 2015-09-14 DIAGNOSIS — Z9884 Bariatric surgery status: Secondary | ICD-10-CM | POA: Diagnosis not present

## 2015-09-14 DIAGNOSIS — Z79899 Other long term (current) drug therapy: Secondary | ICD-10-CM | POA: Insufficient documentation

## 2015-09-14 DIAGNOSIS — G8929 Other chronic pain: Secondary | ICD-10-CM | POA: Diagnosis not present

## 2015-09-14 DIAGNOSIS — Z9049 Acquired absence of other specified parts of digestive tract: Secondary | ICD-10-CM | POA: Diagnosis not present

## 2015-09-14 DIAGNOSIS — Z87891 Personal history of nicotine dependence: Secondary | ICD-10-CM | POA: Diagnosis not present

## 2015-09-14 DIAGNOSIS — R1013 Epigastric pain: Secondary | ICD-10-CM | POA: Diagnosis not present

## 2015-09-14 DIAGNOSIS — K859 Acute pancreatitis without necrosis or infection, unspecified: Secondary | ICD-10-CM | POA: Diagnosis not present

## 2015-09-14 LAB — CBC WITH DIFFERENTIAL/PLATELET
BASOS ABS: 0.1 10*3/uL (ref 0–0.1)
BASOS PCT: 1 %
EOS ABS: 0.1 10*3/uL (ref 0–0.7)
Eosinophils Relative: 2 %
HEMATOCRIT: 40.4 % (ref 35.0–47.0)
Hemoglobin: 13.9 g/dL (ref 12.0–16.0)
Lymphocytes Relative: 33 %
Lymphs Abs: 1.3 10*3/uL (ref 1.0–3.6)
MCH: 29.7 pg (ref 26.0–34.0)
MCHC: 34.4 g/dL (ref 32.0–36.0)
MCV: 86.3 fL (ref 80.0–100.0)
MONO ABS: 0.3 10*3/uL (ref 0.2–0.9)
Monocytes Relative: 7 %
NEUTROS ABS: 2.4 10*3/uL (ref 1.4–6.5)
NEUTROS PCT: 57 %
Platelets: 233 10*3/uL (ref 150–440)
RBC: 4.68 MIL/uL (ref 3.80–5.20)
RDW: 14 % (ref 11.5–14.5)
WBC: 4.1 10*3/uL (ref 3.6–11.0)

## 2015-09-14 LAB — COMPREHENSIVE METABOLIC PANEL
ALBUMIN: 4.1 g/dL (ref 3.5–5.0)
ALK PHOS: 58 U/L (ref 38–126)
ALT: 47 U/L (ref 14–54)
AST: 48 U/L — AB (ref 15–41)
Anion gap: 4 — ABNORMAL LOW (ref 5–15)
BILIRUBIN TOTAL: 0.7 mg/dL (ref 0.3–1.2)
BUN: 7 mg/dL (ref 6–20)
CALCIUM: 8.9 mg/dL (ref 8.9–10.3)
CO2: 30 mmol/L (ref 22–32)
Chloride: 103 mmol/L (ref 101–111)
Creatinine, Ser: 0.68 mg/dL (ref 0.44–1.00)
GFR calc Af Amer: >60 mL/min (ref >60)
GFR calc non Af Amer: >60 mL/min (ref >60)
GLUCOSE: 83 mg/dL (ref 65–99)
Potassium: 3.8 mmol/L (ref 3.5–5.1)
SODIUM: 137 mmol/L (ref 135–145)
TOTAL PROTEIN: 7 g/dL (ref 6.5–8.1)

## 2015-09-14 LAB — IRON AND TIBC
Iron: 124 ug/dL (ref 28–170)
SATURATION RATIOS: 33 % — AB (ref 10.4–31.8)
TIBC: 379 ug/dL (ref 250–450)
UIBC: 255 ug/dL

## 2015-09-14 LAB — FERRITIN: Ferritin: 63 ng/mL (ref 11–307)

## 2015-09-14 NOTE — Telephone Encounter (Signed)
Has lab appt this morning

## 2015-09-18 ENCOUNTER — Telehealth: Payer: Self-pay | Admitting: Family Medicine

## 2015-09-18 ENCOUNTER — Other Ambulatory Visit: Payer: Self-pay | Admitting: Family Medicine

## 2015-09-18 DIAGNOSIS — R109 Unspecified abdominal pain: Secondary | ICD-10-CM

## 2015-09-18 MED ORDER — OXYCODONE HCL 5 MG PO TABS: 5.0000 mg | ORAL_TABLET | Freq: Four times a day (QID) | ORAL | Status: DC | PRN

## 2015-09-18 NOTE — Telephone Encounter (Signed)
Pt stated that she went to the ER at [facility] on 09/15/15 b/c she had abdominal pain. Pt stated that at the ER she was given morphine and was advised to take 2 to 3 pills of the current pain medication she is on HYDROcodone-acetaminophen (NORCO/VICODIN) 5-325 MG tablet. Pt is concerned that taking that much tylenol may not be the best for her liver and pancrease. Pt stated that when she takes 2 pills of the pain medication it doesn't help with the pain. Pt is seeing a surgeon on 09/20/15 and wanted to know if she could try a stronger pain medication without the tylenol. Please advise. Thanks TNP

## 2015-09-18 NOTE — Telephone Encounter (Signed)
She can use up to 3000 mg. Tylenol daily safely. If she does not want to do this I have written for oxycodone which is up front for pickup. I expect her to f/u with her surgeon.

## 2015-09-18 NOTE — Telephone Encounter (Signed)
Please review and advise. KW 

## 2015-09-18 NOTE — Telephone Encounter (Signed)
Patient has been advised, she states that she will pick up script for Oxycodone since Tylenol will not help. Patient is scheduled to see her surgeon Wednesday 4/19.KW

## 2015-09-22 ENCOUNTER — Ambulatory Visit (INDEPENDENT_AMBULATORY_CARE_PROVIDER_SITE_OTHER): Payer: 59 | Admitting: Cardiovascular Disease

## 2015-09-22 ENCOUNTER — Encounter: Payer: Self-pay | Admitting: Cardiovascular Disease

## 2015-09-22 ENCOUNTER — Other Ambulatory Visit
Admission: RE | Admit: 2015-09-22 | Discharge: 2015-09-22 | Disposition: A | Discharge status: Home / Self Care | Payer: 59 | Source: Ambulatory Visit | Attending: Cardiovascular Disease | Admitting: Cardiovascular Disease

## 2015-09-22 ENCOUNTER — Telehealth: Payer: Self-pay | Admitting: Cardiovascular Disease

## 2015-09-22 VITALS — BP 108/72 | HR 69 | Ht 69.0 in | Wt 216.5 lb

## 2015-09-22 DIAGNOSIS — R079 Chest pain, unspecified: Secondary | ICD-10-CM

## 2015-09-22 DIAGNOSIS — R0789 Other chest pain: Secondary | ICD-10-CM

## 2015-09-22 DIAGNOSIS — M79602 Pain in left arm: Secondary | ICD-10-CM

## 2015-09-22 DIAGNOSIS — E669 Obesity, unspecified: Secondary | ICD-10-CM

## 2015-09-22 DIAGNOSIS — R000 Tachycardia, unspecified: Secondary | ICD-10-CM

## 2015-09-22 LAB — CK: CK TOTAL: 66 U/L (ref 38–234)

## 2015-09-22 LAB — TROPONIN I

## 2015-09-22 NOTE — Assessment & Plan Note (Signed)
She does report gaining significant weight, drinking soda, eating the wrong food. She was never given dietary guidance following her surgery. Sometimes reports having a drastic drop in her sugars 2 hours after she eats, into the 50s. Discussed that she may want to research foods that are digested more slowly for sustainable sugar levels.   Total encounter time more than 25 minutes  Greater than 50% was spent in counseling and coordination of care with the patient

## 2015-09-22 NOTE — Assessment & Plan Note (Signed)
Etiology of her left side chest pain is unclear, We have placed in order to have troponin drawn now EKG is normal, symptoms atypical We will call her with the results

## 2015-09-22 NOTE — Telephone Encounter (Signed)
LMOM for patient to come in today for appointment

## 2015-09-22 NOTE — Telephone Encounter (Signed)
Pt wanting to let us know that yesterday she had pain in upper arm into her chest.  It lasted a while, but no SOB  Arm hurt more than chest. Would like to let us know Today she is fine  Just a bit worried. Would like a call back  Pt is coming 09/28/15 to see Dr Rockey Situ

## 2015-09-22 NOTE — Telephone Encounter (Signed)
Spoke w/ pt.  She reports yesterday she had sharp, intense pain in her upper left arm, "it was in my chest a little bit, too". She denies SOB, dizziness, sweating, palpitations or n/v. She does not monitor her BP. Reports pain is still there, but not as intense today. She is on Roxicodone for ab pain due to ? stricture of pancreatic duct, but felt the arm pain thru the meds. She is unsure if sx are r/t ab issues or her heart. She is sched to be seen next week to go over recent event monitor results.  She would like for me to make Dr. Rockey Situ aware and call her back w/ any recommendations. Please advise.  Thank you.

## 2015-09-22 NOTE — Assessment & Plan Note (Signed)
Etiology of her left arm pain that presented yesterday while at rest is unclear. She reports residual symptoms today all away down her arm, less severe. EKG is unrevealing, less likely cardiac though uncertain at this time. Does not appear to be in any distress. Risk factors include prior smoking, obesity. Currently not on any medications that should contribute to left arm pain.  We have recommended we draw some lab work including CK and troponin  she reports having elevated CK before, etiology unclear

## 2015-09-22 NOTE — Progress Notes (Signed)
Patient ID: Misty Zamora, female    DOB: Oct 12, 1975, 40 y.o.   MRN: [id number]  HPI Comments: Ms. Guardian is a very pleasant 40 year old woman with history of obesity, gastric bypass surgery, previously seen in 2012 for lightheadedness/orthostasis, palpitations, shortness of breath who presents today for follow-up of her symptoms of tachycardia and for left arm pain, left chest pain. She is a patient of Dr. Simona Huh Chrismon  In follow-up, she reports that last night she was sitting watching TV when she developed left shoulder, left arm pain, pain also in her left chest. Symptoms lasted for a long period of time, one hour, possibly more She does not feel it was musculoskeletal, no pain on palpation or movement of her left arm. She did not palpate her left chest. 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 Unsuccessful ERCP, may need laparoscopic ERCP They are reviewing her case Sometimes hurts when she eats, other times hurts when she does not eat  30 day monitor reviewed with her in detail This shows episodes of sinus tachycardia, peak heart rate up to 120 bpm Reports waking up sometimes sweaty, leads did not stick  EKG on today's visit shows normal sinus rhythm with rate 69 bpm, no significant ST or T-wave changes  Other past medical history reviewed She's been having problems also with low iron, low vitamin D. Previous iron infusions Low pre-albumin level  Other lab work reviewed with her showing normal thyroid  She used to smoke but stopped in 2001. She smoked for total of 8 years  She was seen in the emergency room on March 04, 2011 for palpitations. Workup was negative including negative chest x-ray, blood work. TSH 0.7. EKG was essentially  normal       No Known Allergies  Current Outpatient Prescriptions on File Prior to Visit  Medication Sig Dispense Refill  . Cholecalciferol (VITAMIN D3) 10000 units capsule Take 10,000 Units by mouth daily. For one month    . clonazePAM (KLONOPIN) 0.5 MG tablet TAKE 1 TABLET BY MOUTH 2 TIMES DAILY AS NEEDED FOR ANXIETY 60 tablet 2  . HYDROcodone-acetaminophen (NORCO/VICODIN) 5-325 MG tablet Take 1-2 tablets by mouth every 6 (six) hours as needed for moderate pain. 60 tablet 0  . omeprazole (PRILOSEC) 20 MG capsule Take 20 mg by mouth 2 (two) times daily before a meal.     . oxyCODONE (ROXICODONE) 5 MG immediate release tablet Take 1 tablet (5 mg total) by mouth every 6 (six) hours as needed for severe pain. 28 tablet 0  . Probiotic Product (PROBIOTIC DAILY PO) Take by mouth daily.    . propranolol (INDERAL) 10 MG tablet Take 1 tablet (10 mg total) by mouth 3 (three) times daily as needed. 90 tablet 6   No current facility-administered medications on file prior to visit.    Past Medical History  Diagnosis Date  . History of cardiovascular stress test 2005    showed MR and TR  . Depression   . Anxiety   . PONV (postoperative nausea and vomiting)     hypotension with epidural  . Hypertension     no meds since gastric bypass  . Heart murmur 2005  . Sleep apnea     was better after gastric bypass, hasd recently  started snoring again.  . Anemia     had iron infusions. last one in Sept. told levels are now normal  . GERD (gastroesophageal reflux disease)     occasional  . Bulging lumbar disc     causes bilateral leg pain  . 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 History of kidney stones   . Wears contact lenses   . IDA (iron deficiency anemia)     Past Surgical History  Procedure Laterality Date  . Gallbladder removed    . Laproscopy    . Colonoscopy  2010  . Renal endoscopy via nephrostomy / pyelostomy  2010  . Tubal ligation    . Gastric bypass  2012  .  Dilation and curettage of uterus    . Elbow surgery Left   . Laparoscopic hysterectomy N/A 12/27/2014    Procedure: HYSTERECTOMY TOTAL LAPAROSCOPIC;  Surgeon: Will Bonnet, MD;  Location: [facility];  Service: Gynecology;  Laterality: N/A;  . Bilateral salpingectomy Bilateral 12/27/2014    Procedure: BILATERAL SALPINGECTOMY;  Surgeon: Will Bonnet, MD;  Location: [facility];  Service: Gynecology;  Laterality: Bilateral;  . Cystoscopy N/A 12/27/2014    Procedure: CYSTOSCOPY;  Surgeon: Will Bonnet, MD;  Location: [facility];  Service: Gynecology;  Laterality: N/A;  . Elbow arthroscopy Left 03/30/2015    Procedure: ARTHROSCOPY ELBOW WITH DEBRIDEMENT OF THE SYMPTOMATIC PLICA;  Surgeon: Corky Mull, MD;  Location: [facility];  Service: Orthopedics;  Laterality: Left;  . Abdominal hysterectomy  12/2014    ovaries in situ  . Ercp      Social History  reports that she quit smoking about 16 years ago. Her smoking use included Cigarettes. She smoked 0.00 packs per day for 8 years. She has never used smokeless tobacco. She reports that she does not drink alcohol or use illicit drugs.  Family History family history includes Breast cancer in her maternal aunt, paternal aunt, and paternal grandmother; Breast cancer (age of onset: 61) in her mother; Cancer in her maternal uncle and mother; Colon polyps in her father and mother; Diabetes in her mother; Hyperlipidemia in her father and mother; Hypertension in her brother, father, mother, and sister.  Review of Systems  Constitutional: Negative.   Respiratory: Negative.   Cardiovascular: Positive for chest pain.  Gastrointestinal: Negative.   Musculoskeletal: Negative.        Left arm pain  Neurological: Negative.   Hematological: Negative.   Psychiatric/Behavioral: Negative.   All other systems reviewed and are negative.   BP 108/72 mmHg  Pulse 69  Ht 5\' 9"  (1.753 m)  Wt 216 lb 8 oz (98.204 kg)  BMI 31.96 kg/m2  LMP 12/14/2014  (Exact Date)   Physical Exam  Constitutional: She is oriented to person, place, and time. She appears well-developed and well-nourished.  HENT:  Head: Normocephalic.  Nose: Nose normal.  Mouth/Throat: Oropharynx is clear and moist.  Eyes: Conjunctivae are normal. Pupils are equal, round, and reactive to light.  Neck: Normal range of motion. Neck supple. No JVD present.  Cardiovascular: Normal rate, regular rhythm, normal heart sounds and intact distal pulses.  Exam reveals no gallop and no friction rub.   No murmur heard. Pulmonary/Chest: Effort normal and breath sounds normal. No respiratory distress. She has no wheezes. She has no rales. She exhibits no tenderness.  Abdominal: Soft. Bowel sounds are normal. She exhibits no distension. There is no tenderness.  Musculoskeletal: Normal range of motion. She exhibits no edema or  tenderness.  Lymphadenopathy:    She has no cervical adenopathy.  Neurological: She is alert and oriented to person, place, and time. Coordination normal.  Skin: Skin is warm and dry. No rash noted. No erythema.  Psychiatric: She has a normal mood and affect. Her behavior is normal. Judgment and thought content normal.

## 2015-09-22 NOTE — Telephone Encounter (Signed)
Perhaps she can be worked in this afternoon, 3:40

## 2015-09-22 NOTE — Patient Instructions (Addendum)
You are doing well. No medication changes were made.  Please take orders to the [place] now for STAT labs CK and troponin right now for chest pain and arm pain  Please call us if you have new issues that need to be addressed before your next appt.

## 2015-09-22 NOTE — Assessment & Plan Note (Signed)
Previously had symptoms of tachycardia 30 day monitor reviewed with her in detail in the office today This showed periods of sinus tachycardia. She takes propranolol when necessary She reports symptoms are somewhat better. 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

## 2015-09-22 NOTE — Telephone Encounter (Signed)
Spoke w/ pt.  Advised her that we can work her in today @ 3:40. She is currently at work here @ [employer].  She will make sure she can come over w/o getting an occurrence and call back if she cannot make it.

## 2015-09-25 ENCOUNTER — Telehealth: Payer: Self-pay | Admitting: Family Medicine

## 2015-09-25 ENCOUNTER — Other Ambulatory Visit: Payer: Self-pay | Admitting: Family Medicine

## 2015-09-25 DIAGNOSIS — R109 Unspecified abdominal pain: Secondary | ICD-10-CM

## 2015-09-25 MED ORDER — HYDROCODONE-ACETAMINOPHEN 5-325 MG PO TABS: 1.0000 | ORAL_TABLET | Freq: Four times a day (QID) | ORAL | Status: DC | PRN

## 2015-09-25 NOTE — Telephone Encounter (Signed)
States oxycodone making her nauseous and seems to amplify her abdominal pain. She has f/u with [facility] this week regarding further surgery. She has f/u with the pain clinic regarding radicular back pain in early May. Have discussed going back to hydrocodone and limiting her daily to acetaminophen intake to 3000 mg.Misty Zamora

## 2015-09-25 NOTE — Telephone Encounter (Signed)
Pt states the Rx for oxyCODONE (ROXICODONE) 5 MG is not really helping with pain.  Pt is requesting something different/stronger.  [id number]

## 2015-09-27 DIAGNOSIS — E162 Hypoglycemia, unspecified: Secondary | ICD-10-CM | POA: Diagnosis not present

## 2015-09-27 DIAGNOSIS — R1013 Epigastric pain: Secondary | ICD-10-CM | POA: Diagnosis not present

## 2015-09-28 ENCOUNTER — Ambulatory Visit: Payer: 59 | Admitting: Cardiovascular Disease

## 2015-09-28 ENCOUNTER — Other Ambulatory Visit: Payer: Self-pay

## 2015-09-28 NOTE — Patient Outreach (Signed)
UMR screening: Reviewed medical record. Call patient. No answer. Unidentified machine. No message left.  Plan: Will continue outreach attempts.  Tomasa Rand, RN, BSN, CEN [facility] [employer] [phone]

## 2015-09-29 ENCOUNTER — Other Ambulatory Visit: Payer: Self-pay

## 2015-09-29 NOTE — Patient Outreach (Signed)
UMR screening: Placed 2nd call to patient. No answer. No voicemail set up.  PLAN: Will Corporate treasurer and attempt again.  Tomasa Rand, RN, BSN, CEN [facility] [employer] [phone]

## 2015-10-02 ENCOUNTER — Telehealth: Payer: Self-pay | Admitting: Cardiovascular Disease

## 2015-10-02 ENCOUNTER — Other Ambulatory Visit: Payer: Self-pay | Admitting: Student

## 2015-10-02 MED ORDER — FUROSEMIDE 20 MG PO TABS: 20.0000 mg | ORAL_TABLET | Freq: Every day | ORAL | Status: DC | PRN

## 2015-10-02 NOTE — Telephone Encounter (Signed)
Patient states that she has some swelling to her hands and feet that has increased more over the weekend. It is to the point that she is unable to put on her rings. She states that at her last visit Dr. Rockey Situ had mentioned possible lasix. Reviewed previous office visit note and saw no mention of this. Instructed her to wear compression hose/socks and to keep her feet elevated to reduce swelling. Also instructed her to watch her salt intake as that can cause increased swelling. She was insistent that she needed lasix and had already tried the other things. She states that she continues to have intermittent chest pain but that all testing has been normal. She just wants to know if there is some medication she can take for both. Let her know that I would forward for review.

## 2015-10-02 NOTE — Telephone Encounter (Signed)
Spoke with the patient in regards to her increased extremity swelling. Reports she has been limiting her fluid to less than 2L, consuming a low-sodium diet, and wearing compression stockings.   Gained approximately 10 pounds in the past 3 days and has noticed swelling in her hands and feet. I sent in an Rx for Lasix 20mg  PRN for lower extremity edema/ weight gain > 3 pounds overnight to the [facility] in [place], [place].   I instructed her if she has to take the Lasix for more than two weeks, she would need repeat labs to check her creatinine and potassium levels. Encouraged to consume potassium-rich foods.   She voiced understanding of this. I would recommend an echocardiogram in the future to assess her LV function if she is in fact having fluid retention despite conservative measures taken over the past few weeks. Reports her last echo was 5+ years ago. Nothing noted in EPIC or Care Everywhere.   Signed, Erma Heritage, PA-C 10/02/2015, 4:48 PM Pager: [phone]

## 2015-10-02 NOTE — Telephone Encounter (Signed)
Pt calling stating she is still having "inmiten cp" Still having swelling. Pt c/o swelling: STAT is pt has developed SOB within 24 hours  1. How long have you been experiencing swelling? Comes and goes  2. Where is the swelling located? Little bit in feet. Noticed in hands first  3.  Are you currently taking a "fluid pill"?  Does not have any.    4.  Are you currently SOB? No   5.  Have you traveled recently? no

## 2015-10-03 ENCOUNTER — Other Ambulatory Visit: Payer: Self-pay | Admitting: Family Medicine

## 2015-10-03 ENCOUNTER — Ambulatory Visit: Payer: 59 | Attending: Pain Medicine | Admitting: Pain Medicine

## 2015-10-03 ENCOUNTER — Encounter: Payer: Self-pay | Admitting: Pain Medicine

## 2015-10-03 VITALS — BP 124/84 | HR 79 | Temp 98.3°F | Resp 16 | Ht 69.0 in | Wt 210.0 lb

## 2015-10-03 DIAGNOSIS — M5136 Other intervertebral disc degeneration, lumbar region: Secondary | ICD-10-CM

## 2015-10-03 DIAGNOSIS — R52 Pain, unspecified: Secondary | ICD-10-CM | POA: Insufficient documentation

## 2015-10-03 DIAGNOSIS — M47817 Spondylosis without myelopathy or radiculopathy, lumbosacral region: Secondary | ICD-10-CM | POA: Diagnosis not present

## 2015-10-03 DIAGNOSIS — M5416 Radiculopathy, lumbar region: Secondary | ICD-10-CM | POA: Diagnosis not present

## 2015-10-03 DIAGNOSIS — R200 Anesthesia of skin: Secondary | ICD-10-CM | POA: Insufficient documentation

## 2015-10-03 DIAGNOSIS — M79606 Pain in leg, unspecified: Secondary | ICD-10-CM | POA: Diagnosis not present

## 2015-10-03 DIAGNOSIS — M47816 Spondylosis without myelopathy or radiculopathy, lumbar region: Secondary | ICD-10-CM | POA: Insufficient documentation

## 2015-10-03 DIAGNOSIS — M545 Low back pain: Secondary | ICD-10-CM | POA: Insufficient documentation

## 2015-10-03 DIAGNOSIS — M791 Myalgia: Secondary | ICD-10-CM | POA: Diagnosis not present

## 2015-10-03 DIAGNOSIS — Z90710 Acquired absence of both cervix and uterus: Secondary | ICD-10-CM | POA: Insufficient documentation

## 2015-10-03 DIAGNOSIS — F419 Anxiety disorder, unspecified: Secondary | ICD-10-CM

## 2015-10-03 DIAGNOSIS — M533 Sacrococcygeal disorders, not elsewhere classified: Secondary | ICD-10-CM | POA: Diagnosis not present

## 2015-10-03 NOTE — Patient Instructions (Addendum)
PLAN  Continue present medication  Lumbar epidural steroid injection to be performed at time of return appointment  F/U PCP R Chauvin for evaliation of  BP and general medical  condition  F/U surgical evaluation. Patient to follow-up with Dr. Arnoldo Morale for neurosurgical reevaluation as discussed  F/U neurological evaluation. Please ask the nurses and secretary the date of your nerve conduction studies of the upper extremities and lower extremities  May consider radiofrequency rhizolysis or intraspinal procedures pending response to present treatment and F/U evaluation   Patient to call Pain Management Center should patient have concerns prior to scheduled return appointment. Pain Management Discharge Instructions  General Discharge Instructions :  If you need to reach your doctor call: Monday-Friday 8:00 am - 4:00 pm at [phone] or toll free [phone].  After clinic hours [phone] to have operator reach doctor.  Bring all of your medication bottles to all your appointments in the pain clinic.  To cancel or reschedule your appointment with Pain Management please remember to call 24 hours in advance to avoid a fee.  Refer to the educational materials which you have been given on: General Risks, I had my Procedure. Discharge Instructions, Post Sedation.  Post Procedure Instructions:  The drugs you were given will stay in your system until tomorrow, so for the next 24 hours you should not drive, make any legal decisions or drink any alcoholic beverages.  You may eat anything you prefer, but it is better to start with liquids then soups and crackers, and gradually work up to solid foods.  Please notify your doctor immediately if you have any unusual bleeding, trouble breathing or pain that is not related to your normal pain.  Depending on the type of procedure that was done, some parts of your body may feel week and/or numb.  This usually clears up by tonight or the next  day.  Walk with the use of an assistive device or accompanied by an adult for the 24 hours.  You may use ice on the affected area for the first 24 hours.  Put ice in a Ziploc bag and cover with a towel and place against area 15 minutes on 15 minutes off.  You may switch to heat after 24 hours.Epidural Steroid Injection Patient Information  Description: The epidural space surrounds the nerves as they exit the spinal cord.  In some patients, the nerves can be compressed and inflamed by a bulging disc or a tight spinal canal (spinal stenosis).  By injecting steroids into the epidural space, we can bring irritated nerves into direct contact with a potentially helpful medication.  These steroids act directly on the irritated nerves and can reduce swelling and inflammation which often leads to decreased pain.  Epidural steroids may be injected anywhere along the spine and from the neck to the low back depending upon the location of your pain.   After numbing the skin with local anesthetic (like Novocaine), a small needle is passed into the epidural space slowly.  You may experience a sensation of pressure while this is being done.  The entire block usually last less than 10 minutes.  Conditions which may be treated by epidural steroids:   Low back and leg pain  Neck and arm pain  Spinal stenosis  Post-laminectomy syndrome  Herpes zoster (shingles) pain  Pain from compression fractures  Preparation for the injection:  1. Do not eat any solid food or dairy products within 8 hours of your appointment.  2. You may drink  clear liquids up to 3 hours before appointment.  Clear liquids include water, black coffee, juice or soda.  No milk or cream please. 3. You may take your regular medication, including pain medications, with a sip of water before your appointment  Diabetics should hold regular insulin (if taken separately) and take 1/2 normal NPH dos the morning of the procedure.  Carry some sugar  containing items with you to your appointment. 4. A driver must accompany you and be prepared to drive you home after your procedure.  5. Bring all your current medications with your. 6. An IV may be inserted and sedation may be given at the discretion of the physician.   7. A blood pressure cuff, EKG and other monitors will often be applied during the procedure.  Some patients may need to have extra oxygen administered for a short period. 8. You will be asked to provide medical information, including your allergies, prior to the procedure.  We must know immediately if you are taking blood thinners (like Coumadin/Warfarin)  Or if you are allergic to IV iodine contrast (dye). We must know if you could possible be pregnant.  Possible side-effects:  Bleeding from needle site  Infection (rare, may require surgery)  Nerve injury (rare)  Numbness & tingling (temporary)  Difficulty urinating (rare, temporary)  Spinal headache ( a headache worse with upright posture)  Light -headedness (temporary)  Pain at injection site (several days)  Decreased blood pressure (temporary)  Weakness in arm/leg (temporary)  Pressure sensation in back/neck (temporary)  Call if you experience:  Fever/chills associated with headache or increased back/neck pain.  Headache worsened by an upright position.  New onset weakness or numbness of an extremity below the injection site  Hives or difficulty breathing (go to the emergency room)  Inflammation or drainage at the infection site  Severe back/neck pain  Any new symptoms which are concerning to you  Please note:  Although the local anesthetic injected can often make your back or neck feel good for several hours after the injection, the pain will likely return.  It takes 3-7 days for steroids to work in the epidural space.  You may not notice any pain relief for at least that one week.  If effective, we will often do a series of three injections  spaced 3-6 weeks apart to maximally decrease your pain.  After the initial series, we generally will wait several months before considering a repeat injection of the same type.  If you have any questions, please call [phone] [place]  What are the risk, side effects and possible complications? Generally speaking, most procedures are safe.  However, with any procedure there are risks, side effects, and the possibility of complications.  The risks and complications are dependent upon the sites that are lesioned, or the type of nerve block to be performed.  The closer the procedure is to the spine, the more serious the risks are.  Great care is taken when placing the radio frequency needles, block needles or lesioning probes, but sometimes complications can occur. 1. Infection: Any time there is an injection through the skin, there is a risk of infection.  This is why sterile conditions are used for these blocks.  There are four possible types of infection. 1. Localized skin infection. 2. Central Nervous System Infection-This can be in the form of Meningitis, which can be deadly. 3. Epidural Infections-This can be in the form of an epidural  abscess, which can cause pressure inside of the spine, causing compression of the spinal cord with subsequent paralysis. This would require an emergency surgery to decompress, and there are no guarantees that the patient would recover from the paralysis. 4. Discitis-This is an infection of the intervertebral discs.  It occurs in about 1% of discography procedures.  It is difficult to treat and it may lead to surgery.        2. Pain: the needles have to go through skin and soft tissues, will cause soreness.       3. Damage to internal structures:  The nerves to be lesioned may be near blood vessels or    other nerves which can be potentially damaged.       4. Bleeding: Bleeding is more common if  the patient is taking blood thinners such as  aspirin, Coumadin, Ticiid, Plavix, etc., or if he/she have some genetic predisposition  such as hemophilia. Bleeding into the spinal canal can cause compression of the spinal  cord with subsequent paralysis.  This would require an emergency surgery to  decompress and there are no guarantees that the patient would recover from the  paralysis.       5. Pneumothorax:  Puncturing of a lung is a possibility, every time a needle is introduced in  the area of the chest or upper back.  Pneumothorax refers to free air around the  collapsed lung(s), inside of the thoracic cavity (chest cavity).  Another two possible  complications related to a similar event would include: Hemothorax and Chylothorax.   These are variations of the Pneumothorax, where instead of air around the collapsed  lung(s), you may have blood or chyle, respectively.       6. Spinal headaches: They may occur with any procedures in the area of the spine.       7. Persistent CSF (Cerebro-Spinal Fluid) leakage: This is a rare problem, but may occur  with prolonged intrathecal or epidural catheters either due to the formation of a fistulous  track or a dural tear.       8. Nerve damage: By working so close to the spinal cord, there is always a possibility of  nerve damage, which could be as serious as a permanent spinal cord injury with  paralysis.       9. Death:  Although rare, severe deadly allergic reactions known as "Anaphylactic  reaction" can occur to any of the medications used.      10. Worsening of the symptoms:  We can always make thing worse.  What are the chances of something like this happening? Chances of any of this occuring are extremely low.  By statistics, you have more of a chance of getting killed in a motor vehicle accident: while driving to the hospital than any of the above occurring .  Nevertheless, you should be aware that they are possibilities.  In general, it is similar to  taking a shower.  Everybody knows that you can slip, hit your head and get killed.  Does that mean that you should not shower again?  Nevertheless always keep in mind that statistics do not mean anything if you happen to be on the wrong side of them.  Even if a procedure has a 1 (one) in a 1,000,000 (million) chance of going wrong, it you happen to be that one..Also, keep in mind that by statistics, you have more of a chance of having something go wrong when taking medications.  Who should not have this procedure? If you are on a blood thinning medication (e.g. Coumadin, Plavix, see list of "Blood Thinners"), or if you have an active infection going on, you should not have the procedure.  If you are taking any blood thinners, please inform your physician.  How should I prepare for this procedure?  Do not eat or drink anything at least six hours prior to the procedure.  Bring a driver with you .  It cannot be a taxi.  Come accompanied by an adult that can drive you back, and that is strong enough to help you if your legs get weak or numb from the local anesthetic.  Take all of your medicines the morning of the procedure with just enough water to swallow them.  If you have diabetes, make sure that you are scheduled to have your procedure done first thing in the morning, whenever possible.  If you have diabetes, take only half of your insulin dose and notify our nurse that you have done so as soon as you arrive at the clinic.  If you are diabetic, but only take blood sugar pills (oral hypoglycemic), then do not take them on the morning of your procedure.  You may take them after you have had the procedure.  Do not take aspirin or any aspirin-containing medications, at least eleven (11) days prior to the procedure.  They may prolong bleeding.  Wear loose fitting clothing that may be easy to take off and that you would not mind if it got stained with Betadine or blood.  Do not wear any jewelry or  perfume  Remove any nail coloring.  It will interfere with some of our monitoring equipment.  NOTE: Remember that this is not meant to be interpreted as a complete list of all possible complications.  Unforeseen problems may occur.  BLOOD THINNERS The following drugs contain aspirin or other products, which can cause increased bleeding during surgery and should not be taken for 2 weeks prior to and 1 week after surgery.  If you should need take something for relief of minor pain, you may take acetaminophen which is found in Tylenol,m Datril, Anacin-3 and Panadol. It is not blood thinner. The products listed below are.  Do not take any of the products listed below in addition to any listed on your instruction sheet.  A.P.C or A.P.C with Codeine Codeine Phosphate Capsules #3 Ibuprofen Ridaura  ABC compound Congesprin Imuran rimadil  Advil Cope Indocin Robaxisal  Alka-Seltzer Effervescent Pain Reliever and Antacid Coricidin or Coricidin-D  Indomethacin Rufen  Alka-Seltzer plus Cold Medicine Cosprin Ketoprofen S-A-C Tablets  Anacin Analgesic Tablets or Capsules Coumadin Korlgesic Salflex  Anacin Extra Strength Analgesic tablets or capsules CP-2 Tablets Lanoril Salicylate  Anaprox Cuprimine Capsules Levenox Salocol  Anexsia-D Dalteparin Magan Salsalate  Anodynos Darvon compound Magnesium Salicylate Sine-off  Ansaid Dasin Capsules Magsal Sodium Salicylate  Anturane Depen Capsules Marnal Soma  APF Arthritis pain formula Dewitt's Pills Measurin Stanback  Argesic Dia-Gesic Meclofenamic Sulfinpyrazone  Arthritis Bayer Timed Release Aspirin Diclofenac Meclomen Sulindac  Arthritis pain formula Anacin Dicumarol Medipren Supac  Analgesic (Safety coated) Arthralgen Diffunasal Mefanamic Suprofen  Arthritis Strength Bufferin Dihydrocodeine Mepro Compound Suprol  Arthropan liquid Dopirydamole Methcarbomol with Aspirin Synalgos  ASA tablets/Enseals Disalcid Micrainin Tagament  Ascriptin Doan's Midol  Talwin  Ascriptin A/D Dolene Mobidin Tanderil  Ascriptin Extra Strength Dolobid Moblgesic Ticlid  Ascriptin with Codeine Doloprin or Doloprin with Codeine Momentum Tolectin  Asperbuf Duoprin Mono-gesic Trendar  Aspergum Duradyne  Motrin or Motrin IB Triminicin  Aspirin plain, buffered or enteric coated Durasal Myochrisine Trigesic  Aspirin Suppositories Easprin Nalfon Trillsate  Aspirin with Codeine Ecotrin Regular or Extra Strength Naprosyn Uracel  Atromid-S Efficin Naproxen Ursinus  Auranofin Capsules Elmiron Neocylate Vanquish  Axotal Emagrin Norgesic Verin  Azathioprine Empirin or Empirin with Codeine Normiflo Vitamin E  Azolid Emprazil Nuprin Voltaren  Bayer Aspirin plain, buffered or children's or timed BC Tablets or powders Encaprin Orgaran Warfarin Sodium  Buff-a-Comp Enoxaparin Orudis Zorpin  Buff-a-Comp with Codeine Equegesic Os-Cal-Gesic   Buffaprin Excedrin plain, buffered or Extra Strength Oxalid   Bufferin Arthritis Strength Feldene Oxphenbutazone   Bufferin plain or Extra Strength Feldene Capsules Oxycodone with Aspirin   Bufferin with Codeine Fenoprofen Fenoprofen Pabalate or Pabalate-SF   Buffets II Flogesic Panagesic   Buffinol plain or Extra Strength Florinal or Florinal with Codeine Panwarfarin   Buf-Tabs Flurbiprofen Penicillamine   Butalbital Compound Four-way cold tablets Penicillin   Butazolidin Fragmin Pepto-Bismol   Carbenicillin Geminisyn Percodan   Carna Arthritis Reliever Geopen Persantine   Carprofen Gold's salt Persistin   Chloramphenicol Goody's Phenylbutazone   Chloromycetin Haltrain Piroxlcam   Clmetidine heparin Plaquenil   Cllnoril Hyco-pap Ponstel   Clofibrate Hydroxy chloroquine Propoxyphen         Before stopping any of these medications, be sure to consult the physician who ordered them.  Some, such as Coumadin (Warfarin) are ordered to prevent or treat serious conditions such as "deep thrombosis", "pumonary embolisms", and other heart  problems.  The amount of time that you may need off of the medication may also vary with the medication and the reason for which you were taking it.  If you are taking any of these medications, please make sure you notify your pain physician before you undergo any procedures.

## 2015-10-03 NOTE — Telephone Encounter (Signed)
Please review. Thanks!  

## 2015-10-03 NOTE — Progress Notes (Signed)
Subjective:    Patient ID: Misty Zamora, female    DOB: 28-Feb-1976, 40 y.o.   MRN: [id number]  HPI  Continuation of dictation:  We will schedule patient for evaluation of paresthesias of the upper extremities and lower extremities and we'll schedule PNCV/EMG studies of the upper extremities and lower extremities. The patient will follow-up with Dr. Arnoldo Morale for neurosurgical reevaluation as discussed. We will consider modifications of treatment regimen pending response to treatment and follow-up evaluation. The patient was with understanding and in agreement with suggested treatment plan.  Review of Systems    Cardiovascular: Unremarkable   Pulmonary: Unremarkable  Neurological: Unremarkable  Psychological: Anxiety  Gastrointestinal: GI ulcers Gastroesophageal reflux disease Irritable bowel syndrome Pancreatitis  Genitourinary: Kidney stones Hematuria  Hematologic: Anemia  Endocrine: Unremarkable  Rheumatological: Unremarkable  Musculoskeletal: Unremarkable  Other significant: Unremarkable      Objective:   Physical Exam  There was tenderness to palpation of the splenius capitis and occipitalis musculature regions. Palpation of these regions reproduced pain of moderate degree. There was mild tenderness of the acromioclavicular and glenohumeral joint regions. Palpation of the thoracic region thoracic facet region was a tennis to palpation of mild to moderate degree with no crepitus of the thoracic region noted. The patient was without definitely decreased grip strength and Tinel and Phalen's maneuver were without exacerbation of pain of significant degree. Palpation over the lumbar paraspinal musculatures and lumbar facet region was associated with mild to moderate discomfort with lateral bending rotation extension and palpation of the lumbar facets reproducing mild to moderate discomfort. Straight leg raise was tolerates approximately 30 without a definite  increase of pain with dorsiflexion noted. DTRs difficult to elicit appeared to be trace at the knees. The patient had difficulty of minimal degree attending to stand on tiptoes and heels. There was mild tenderness along the greater trochanteric region iliotibial band region. The knees were without excessive tends to palpation and were with negative anterior and posterior drawer signs No definite sensory deficit or dermatomal distribution detected. There was negative clonus negative Homans. No costovertebral tenderness was noted           Assessment & Plan:    Degenerative disc disease lumbar spine IMPRESSION: LUMBAR MYELOGRAM IMPRESSION:  No nerve root cut off or spinal stenosis. No dynamic instability. Disc space narrowing L5-S1.  CT LUMBAR MYELOGRAM IMPRESSION:  Central and rightward protrusion L5-S1. Mild BILATERAL foraminal narrowing. No neural impingement is demonstrated.  LumbIMPRESSION: L4-5: Disc degeneration with bulging of the disc and mild facet hypertrophy but no stenosis or neural compression.  L5-S1: Disc herniation more prominent centrally and to the right, indenting the thecal sac and abutting the S1 root sleeves. Neural compression is not demonstrated but this could be associated with back pain or neural irritation.ar MRI  Lumbar facet syndrome  Lumbar radiculopathy  Sacroiliac joint dysfunction  Cervical facet syndrome      PLAN  Continue present medication  Lumbar epidural steroid injection to be performed at time of return appointment  F/U PCP R Chauvin for evaliation of  BP and general medical  condition  F/U surgical evaluation. Patient to follow-up with Dr. Arnoldo Morale for neurosurgical reevaluation as discussed  F/U neurological evaluation. Please ask the nurses and secretary the date of your nerve conduction studies of the upper extremities and lower extremities  May consider radiofrequency rhizolysis or intraspinal procedures pending  response to present treatment and F/U evaluation   Patient to call Pain Management Center should patient have concerns prior  to scheduled return appointment.

## 2015-10-03 NOTE — Progress Notes (Signed)
Safety precautions to be maintained throughout the outpatient stay will include: orient to surroundings, keep bed in low position, maintain call bell within reach at all times, provide assistance with transfer out of bed and ambulation.  
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
## 2015-10-03 NOTE — Telephone Encounter (Signed)
Pt contacted office for refill request on the following medications: clonazePAM (KLONOPIN) 0.5 MG tablet to [facility]. Pt stated that she didn't realize that she was out of refills and only has one pill left. Pt would like this sent in asap.  Last written: 07/14/15 with 2 refills Last OV: 07/11/15 Please advise. Thanks TNP

## 2015-10-04 ENCOUNTER — Other Ambulatory Visit: Payer: Self-pay | Admitting: Family Medicine

## 2015-10-04 DIAGNOSIS — F419 Anxiety disorder, unspecified: Secondary | ICD-10-CM

## 2015-10-04 DIAGNOSIS — Z9884 Bariatric surgery status: Secondary | ICD-10-CM | POA: Diagnosis not present

## 2015-10-04 DIAGNOSIS — R5383 Other fatigue: Secondary | ICD-10-CM | POA: Diagnosis not present

## 2015-10-04 DIAGNOSIS — R5381 Other malaise: Secondary | ICD-10-CM | POA: Diagnosis not present

## 2015-10-04 DIAGNOSIS — E161 Other hypoglycemia: Secondary | ICD-10-CM | POA: Diagnosis not present

## 2015-10-04 MED ORDER — CLONAZEPAM 0.5 MG PO TABS: ORAL_TABLET | ORAL | Status: DC

## 2015-10-04 NOTE — Telephone Encounter (Signed)
Prescription has been called into [facility]. KW

## 2015-10-04 NOTE — Telephone Encounter (Signed)
Please call in refill as updated in the EMR

## 2015-10-06 ENCOUNTER — Other Ambulatory Visit: Payer: Self-pay | Admitting: Family Medicine

## 2015-10-06 ENCOUNTER — Telehealth: Payer: Self-pay | Admitting: Family Medicine

## 2015-10-06 DIAGNOSIS — R109 Unspecified abdominal pain: Secondary | ICD-10-CM

## 2015-10-06 MED ORDER — HYDROCODONE-ACETAMINOPHEN 5-325 MG PO TABS: ORAL_TABLET | ORAL | Status: DC

## 2015-10-06 NOTE — Telephone Encounter (Signed)
Currently using 3 hydrocodone twice daily for abdominal pain. She has a further abdominal procedure scheduled for 5/10 at [facility]. Has had first visit with Dr. Elgie Congo at the pain clinic for chronic back pain. Will refill her rx to reflect her current use pending Dr. Primus Bravo initiating pain medication

## 2015-10-06 NOTE — Telephone Encounter (Signed)
Pt needs refill on her HYDROcodone-acetaminophen (NORCO/VICODIN) 5-325 MG tablet    ThanksTeri

## 2015-10-08 LAB — TOXASSURE SELECT 13 (MW), URINE

## 2015-10-09 ENCOUNTER — Other Ambulatory Visit: Payer: Self-pay

## 2015-10-09 NOTE — Progress Notes (Signed)
Quick Note:  Reviewed. ______ 

## 2015-10-09 NOTE — Patient Outreach (Signed)
[facility] screening  Placed 3rd call to patient for [facility] screening. No answer  Mailed letter on 09/29/2015  PLAN: If no response to letter on 10/13/2015 will close case.  Tomasa Rand, RN, BSN, CEN [facility] [employer] [phone]

## 2015-10-11 DIAGNOSIS — Z9889 Other specified postprocedural states: Secondary | ICD-10-CM | POA: Diagnosis not present

## 2015-10-11 DIAGNOSIS — K831 Obstruction of bile duct: Secondary | ICD-10-CM | POA: Diagnosis not present

## 2015-10-11 DIAGNOSIS — I10 Essential (primary) hypertension: Secondary | ICD-10-CM | POA: Diagnosis not present

## 2015-10-11 DIAGNOSIS — K838 Other specified diseases of biliary tract: Secondary | ICD-10-CM | POA: Diagnosis not present

## 2015-10-11 DIAGNOSIS — Z90710 Acquired absence of both cervix and uterus: Secondary | ICD-10-CM | POA: Diagnosis not present

## 2015-10-11 DIAGNOSIS — M25511 Pain in right shoulder: Secondary | ICD-10-CM | POA: Diagnosis not present

## 2015-10-11 DIAGNOSIS — Z9049 Acquired absence of other specified parts of digestive tract: Secondary | ICD-10-CM | POA: Diagnosis not present

## 2015-10-11 DIAGNOSIS — Z9884 Bariatric surgery status: Secondary | ICD-10-CM | POA: Diagnosis not present

## 2015-10-11 DIAGNOSIS — E042 Nontoxic multinodular goiter: Secondary | ICD-10-CM | POA: Diagnosis not present

## 2015-10-11 DIAGNOSIS — R1013 Epigastric pain: Secondary | ICD-10-CM | POA: Diagnosis not present

## 2015-10-12 DIAGNOSIS — Z9889 Other specified postprocedural states: Secondary | ICD-10-CM | POA: Diagnosis not present

## 2015-10-12 DIAGNOSIS — E042 Nontoxic multinodular goiter: Secondary | ICD-10-CM | POA: Diagnosis not present

## 2015-10-12 DIAGNOSIS — M25511 Pain in right shoulder: Secondary | ICD-10-CM | POA: Diagnosis not present

## 2015-10-12 DIAGNOSIS — R1013 Epigastric pain: Secondary | ICD-10-CM | POA: Diagnosis not present

## 2015-10-12 DIAGNOSIS — I10 Essential (primary) hypertension: Secondary | ICD-10-CM | POA: Diagnosis not present

## 2015-10-12 DIAGNOSIS — K838 Other specified diseases of biliary tract: Secondary | ICD-10-CM | POA: Diagnosis not present

## 2015-10-12 DIAGNOSIS — Z90710 Acquired absence of both cervix and uterus: Secondary | ICD-10-CM | POA: Diagnosis not present

## 2015-10-12 DIAGNOSIS — Z9049 Acquired absence of other specified parts of digestive tract: Secondary | ICD-10-CM | POA: Diagnosis not present

## 2015-10-12 DIAGNOSIS — Z9884 Bariatric surgery status: Secondary | ICD-10-CM | POA: Diagnosis not present

## 2015-10-13 ENCOUNTER — Other Ambulatory Visit: Payer: Self-pay

## 2015-10-13 NOTE — Patient Outreach (Signed)
[facility] screening: No response to 3 telephone outreach attempts or letter.   PLAN: Will close case for reason of no response.  Tomasa Rand, RN, BSN, CEN [facility] [employer] [phone]

## 2015-10-16 ENCOUNTER — Encounter: Payer: Self-pay | Admitting: Pain Medicine

## 2015-10-16 ENCOUNTER — Ambulatory Visit: Payer: 59 | Attending: Pain Medicine | Admitting: Pain Medicine

## 2015-10-16 VITALS — BP 118/61 | HR 74 | Temp 97.6°F | Resp 15 | Ht 69.0 in | Wt 210.0 lb

## 2015-10-16 DIAGNOSIS — M5136 Other intervertebral disc degeneration, lumbar region: Secondary | ICD-10-CM | POA: Insufficient documentation

## 2015-10-16 DIAGNOSIS — M47816 Spondylosis without myelopathy or radiculopathy, lumbar region: Secondary | ICD-10-CM

## 2015-10-16 DIAGNOSIS — M5126 Other intervertebral disc displacement, lumbar region: Secondary | ICD-10-CM | POA: Insufficient documentation

## 2015-10-16 DIAGNOSIS — M79606 Pain in leg, unspecified: Secondary | ICD-10-CM | POA: Diagnosis present

## 2015-10-16 DIAGNOSIS — M5416 Radiculopathy, lumbar region: Secondary | ICD-10-CM | POA: Diagnosis not present

## 2015-10-16 DIAGNOSIS — M545 Low back pain: Secondary | ICD-10-CM | POA: Diagnosis not present

## 2015-10-16 MED ORDER — ORPHENADRINE CITRATE 30 MG/ML IJ SOLN
60.0000 mg | Freq: Once | INTRAMUSCULAR | Status: AC
  Administered 2015-10-16: 60 mg via INTRAMUSCULAR
  Filled 2015-10-16: qty 2

## 2015-10-16 MED ORDER — LACTATED RINGERS IV SOLN
1000.0000 mL | INTRAVENOUS | Status: DC
  Administered 2015-10-16: 1000 mL via INTRAVENOUS

## 2015-10-16 MED ORDER — LIDOCAINE HCL (PF) 1 % IJ SOLN
10.0000 mL | Freq: Once | INTRAMUSCULAR | Status: DC
  Filled 2015-10-16: qty 10

## 2015-10-16 MED ORDER — BUPIVACAINE HCL (PF) 0.25 % IJ SOLN
30.0000 mL | Freq: Once | INTRAMUSCULAR | Status: DC
  Filled 2015-10-16: qty 30

## 2015-10-16 MED ORDER — SODIUM CHLORIDE 0.9% FLUSH: 20.0000 mL | Freq: Once | INTRAVENOUS | Status: DC

## 2015-10-16 MED ORDER — MIDAZOLAM HCL 5 MG/5ML IJ SOLN
5.0000 mg | Freq: Once | INTRAMUSCULAR | Status: AC
  Administered 2015-10-16: 5 mg via INTRAVENOUS
  Filled 2015-10-16: qty 5

## 2015-10-16 MED ORDER — FENTANYL CITRATE (PF) 100 MCG/2ML IJ SOLN
100.0000 ug | Freq: Once | INTRAMUSCULAR | Status: AC
  Administered 2015-10-16: 100 ug via INTRAVENOUS
  Filled 2015-10-16: qty 2

## 2015-10-16 MED ORDER — TRIAMCINOLONE ACETONIDE 40 MG/ML IJ SUSP
40.0000 mg | Freq: Once | INTRAMUSCULAR | Status: DC
  Filled 2015-10-16: qty 1
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
## 2015-10-16 NOTE — Progress Notes (Signed)
Safety precautions to be maintained throughout the outpatient stay will include: orient to surroundings, keep bed in low position, maintain call bell within reach at all times, provide assistance with transfer out of bed and ambulation.  

## 2015-10-16 NOTE — Progress Notes (Signed)
Subjective:    Patient ID: Misty Zamora, female    DOB: August 12, 1975, 40 y.o.   MRN: [id number]  HPI  PROCEDURE PERFORMED: Lumbar epidural steroid injection   NOTE: The patient is a 40 y.o. female who returns to [facility] for further evaluation and treatment of pain involving the lumbar and lower extremity region. CT myelogram revealed the patient to be with Degenerative disc disease lumbar spine  LUMBAR MYELOGRAM IMPRESSION:  No nerve root cut off or spinal stenosis. No dynamic instability. Disc space narrowing L5-S1.  CT LUMBAR MYELOGRAM IMPRESSION:  Central and rightward protrusion L5-S1. Mild BILATERAL foraminal narrowing. No neural impingement is demonstrated.  LumbIMPRESSION: L4-5: Disc degeneration with bulging of the disc and mild facet hypertrophy but no stenosis or neural compression.  L5-S1: Disc herniation more prominent centrally and to the right, indenting the thecal sac and abutting the S1 root sleeves. Neural compression is not demonstrated but this could be associated with back pain or neural irritation.ar MRI. There is concern regarding intraspinal abnormalities contributing to patient's symptomatology The risks, benefits, and expectations of the procedure have been discussed and explained to the patient who was understanding and in agreement with suggested treatment plan. We will proceed with lumbar epidural steroid injection as discussed and as explained to the patient who is willing to proceed with procedure as planned.   DESCRIPTION OF PROCEDURE: Lumbar epidural steroid injection with IV Versed, IV fentanyl conscious sedation, EKG, blood pressure, pulse, capnography, and pulse oximetry monitoring. The procedure was performed with the patient in the prone position under fluoroscopic guidance. A local anesthetic skin wheal of 1.5% plain lidocaine was accomplished at proposed entry site. An 18-gauge Tuohy epidural needle was inserted at the L 4  vertebral body level left of the midline via loss-of-resistance technique with negative heme and negative CSF return. A total of 4 mL of Preservative-Free normal saline with 40 mg of Kenalog injected incrementally via epidurally placed needle. Needle was removed.  Myoneural block injections of the gluteal musculature region Following Betadine prep of proposed entry site a 22-gauge needle was inserted into the gluteal musculature region and following negative aspiration 2 cc of 0.25% bupivacaine with Norflex was injected for myoneural block injections of the gluteal musculature region times two.  A total of 40 mg of Kenalog was utilized for the procedure.   The patient tolerated the injection well.    PLAN:   1. Medications: We will continue presently prescribed medications. 2. Will consider modification of treatment regimen pending response to treatment rendered on today's visit and follow-up evaluation. 3. The patient is to follow-up with primary care physician Dr Everardo Pacific regarding blood pressure and general medical condition status post lumbar epidural steroid injection performed on today's visit. 4. Surgical evaluation. Has been addressed 5. Neurological evaluation. Has been addressed and may consider further studies guarding neurological assessment 6. The patient may be a candidate for radiofrequency procedures, implantation device, and other treatment pending response to treatment and follow-up evaluation. 7. The patient has been advised to adhere to proper body mechanics and avoid activities which appear to aggravate condition. 8. The patient has been advised to call the Pain Management Center prior to scheduled return appointment should there be significant change in condition or should there be sign  The patient is understanding and agrees with the suggested  treatment plan   Review of Systems     Objective:   Physical Exam        Assessment & Plan:

## 2015-10-17 ENCOUNTER — Telehealth: Payer: Self-pay | Admitting: *Deleted

## 2015-10-17 NOTE — Telephone Encounter (Signed)
Spoke with patient denies any complications or concerns from procedure on yesterday.

## 2015-10-25 ENCOUNTER — Telehealth: Payer: Self-pay | Admitting: Pain Medicine

## 2015-10-25 ENCOUNTER — Telehealth: Payer: Self-pay

## 2015-10-25 ENCOUNTER — Other Ambulatory Visit: Payer: Self-pay | Admitting: Pain Medicine

## 2015-10-25 DIAGNOSIS — M47816 Spondylosis without myelopathy or radiculopathy, lumbar region: Secondary | ICD-10-CM

## 2015-10-25 DIAGNOSIS — M5136 Other intervertebral disc degeneration, lumbar region: Secondary | ICD-10-CM

## 2015-10-25 DIAGNOSIS — M5416 Radiculopathy, lumbar region: Secondary | ICD-10-CM

## 2015-10-25 NOTE — Telephone Encounter (Signed)
Still having a lot of pain since injections, can she get meds called in for pain, please call patient

## 2015-10-25 NOTE — Telephone Encounter (Signed)
Pt had procedure on May 14 and is still in pain. She is not scheduled to come back until June 6 or 7th. Her MD has given [name] permission to prescribe pain meds. She currently takes Oxy 5-325mg  3 tablets BID.  When talking to pt, she stated that she took 3-4 tabs three of four times a day according to her pain. Patient is out of meds and wants you to write her some. Told pt that she would probably need a sooner appt.

## 2015-10-25 NOTE — Telephone Encounter (Signed)
Nurses, Dr Arnoldo Morale did not recommend surgery. Ask patient if she would like another neurosurgical evaluation and schedule if she agrees.I have not promised to prescribe medications for patient.  Patient should discuss medications with her physician who is prescribing medications for her pain at this time If patient has new or severe symptoms, have patient see Dr Arnoldo Morale or go to the ED and have ED  MD call me if necessary Thank you

## 2015-10-26 ENCOUNTER — Telehealth: Payer: Self-pay | Admitting: *Deleted

## 2015-10-26 NOTE — Telephone Encounter (Signed)
Spoke with patient and she states that she has run out of pain medicine.  She wants to know if she can schedule an earlier appt to discuss you prescribing the medication that she currently takes. I did relay the comment you made re; her first phone call.  She is unsatisfied with this answer.  Can we schedule a sooner appt.

## 2015-10-26 NOTE — Telephone Encounter (Signed)
Nurses Please see my response to this message yesterday Wednesday, 10/25/2015 please be sure to review the message of Wednesday, 10/25/2015. In essence the message stated that I have not promised to prescribing any medications for the patient. Dr. Arnoldo Morale also has not recommended surgery for patient at this time. In the previous message of yesterday I stated that I may not commit to prescribing medications for patient's pain at this time or in the future. I have recommended patient for another neurosurgical evaluation to evaluate for abnormalities on her lumbar MRI. Please schedule patient for the neurosurgical evaluation if she agrees to do such. I have also recommended that the patient return to the physician who prescribed medications for treatment of her pain at this time. I also mentioned in the message of Wednesday, 10/25/2015 that the patient should go to the emergency room if she has an increase of her symptoms and have the emergency room physician called me if he wishes to do such. Please discuss any other issues with me regarding this patient if necessary. Thank you

## 2015-10-26 NOTE — Telephone Encounter (Signed)
Spoke with patient again re; [name] response to her wanting pain medication prescribed.  I read the message to the patient just as i did the previous message and patient was argumentative and stated "this is ridiculous".  I told the patient that she could proceed with suggestions by [name] or keep her next appt with [name].  Patient continued to be argumentative and my final was that nothing else was up for discussion.  Patient said thank you and goodbye.

## 2015-10-27 ENCOUNTER — Telehealth: Payer: Self-pay | Admitting: *Deleted

## 2015-10-27 ENCOUNTER — Ambulatory Visit (INDEPENDENT_AMBULATORY_CARE_PROVIDER_SITE_OTHER): Payer: 59 | Admitting: Family Medicine

## 2015-10-27 ENCOUNTER — Telehealth: Payer: Self-pay | Admitting: Family Medicine

## 2015-10-27 ENCOUNTER — Other Ambulatory Visit: Payer: Self-pay | Admitting: Family Medicine

## 2015-10-27 ENCOUNTER — Encounter: Payer: Self-pay | Admitting: Family Medicine

## 2015-10-27 VITALS — BP 128/80 | HR 82 | Temp 98.1°F | Resp 16 | Wt 213.0 lb

## 2015-10-27 DIAGNOSIS — F419 Anxiety disorder, unspecified: Secondary | ICD-10-CM | POA: Diagnosis not present

## 2015-10-27 DIAGNOSIS — M5116 Intervertebral disc disorders with radiculopathy, lumbar region: Secondary | ICD-10-CM | POA: Diagnosis not present

## 2015-10-27 DIAGNOSIS — R1013 Epigastric pain: Secondary | ICD-10-CM | POA: Diagnosis not present

## 2015-10-27 DIAGNOSIS — G8929 Other chronic pain: Secondary | ICD-10-CM | POA: Diagnosis not present

## 2015-10-27 DIAGNOSIS — E669 Obesity, unspecified: Secondary | ICD-10-CM | POA: Insufficient documentation

## 2015-10-27 MED ORDER — HYDROCODONE-ACETAMINOPHEN 10-325 MG PO TABS: 1.0000 | ORAL_TABLET | ORAL | Status: DC | PRN

## 2015-10-27 MED ORDER — DULOXETINE HCL 30 MG PO CPEP: ORAL_CAPSULE | ORAL | Status: DC

## 2015-10-27 NOTE — Telephone Encounter (Signed)
sw pt she wish to cancel this appt...td

## 2015-10-27 NOTE — Telephone Encounter (Signed)
Pt states that she has been to neurosurgeon who states she does not need surgery at this time and has been to pain clinic who done an epidural shot 10/16/15.She states that this procedure has not helped and pain clinic not willing to prescribe any meds.Please advise

## 2015-10-27 NOTE — Progress Notes (Signed)
Subjective:     Patient ID: Misty Zamora, female   DOB: 08/09/1975, 40 y.o.   MRN: [id number]  HPI  Chief Complaint  Patient presents with  . Pain    chronic pain  . Abdominal Pain    right upper abdomen   Comes in today accompanied by her mother for further management of anxiety ,chronic abdominal pain and chronic left radicular back pain. States pain is intermittent and fluctuates from 2/10 to 10/10 abdominally and 6/10 in her back. She states she has taken at times up to 20 mg.of hydrocodone 3-4 x day. 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 Has been on gabapentin in the past but not Cymbalta or Lyrica. Recently received a back injection per Dr. Primus Bravo but states it did not help. She states he will not be prescribing any medication so declines to go back. He has suggested she return to neurosurgery. She states her [facility]doctors have told her that as her lab tests remain stable they do not wish to attempt further surgery on her biliary tract. She is very frustrated and at times tearful today.   Review of Systems     Objective:   Physical Exam  Constitutional: She appears well-developed and well-nourished. She appears distressed (tearful and frustrated with her lack of progress).       Assessment:    1. Anxiety: may also help her pain - DULoxetine (CYMBALTA) 30 MG capsule; Take one daily for 7 days then two pills daily  Dispense: 60 capsule; Refill: 0  2. Neuritis or radiculitis due to rupture of lumbar intervertebral disc - HYDROcodone-acetaminophen (NORCO) 10-325 MG tablet; Take 1 tablet by mouth every 4 (four) hours as needed.  Dispense: 180 tablet; Refill: 0  3. Abdominal pain, chronic, epigastric - HYDROcodone-acetaminophen (NORCO) 10-325 MG tablet; Take 1 tablet by mouth every 4 (four) hours as needed.  Dispense: 180 tablet; Refill: 0    Plan:    Instructed not to take more than 6 hydrocodone in a 24 hour period. No  early refills. Consider referral back to her neurosurgeon initiated by the pain clinic or [facility] spinal clinic. Consider Lyrica.

## 2015-10-27 NOTE — Patient Instructions (Signed)
Let me know if you can't tolerate the duloxetine (Cymbalta).

## 2015-10-27 NOTE — Telephone Encounter (Signed)
Please advise 

## 2015-10-27 NOTE — Telephone Encounter (Signed)
Patient has arrived at the office and has been put on the schedule.

## 2015-10-31 ENCOUNTER — Telehealth: Payer: Self-pay | Admitting: Family Medicine

## 2015-10-31 NOTE — Telephone Encounter (Signed)
Pt called saying she was doing much better.  Her call back is [phone]  Thanks, Con Memos

## 2015-10-31 NOTE — Telephone Encounter (Signed)
Contacting patient today at home after receiving message from patients provider, Message stated that patient had scheduled an emergency appt with specialist in reference to symptoms of depression, patient was seen in our office by Misty Zamora on 10/27/15 to discuss pain medication and address symptoms of anxiety. It is not known if patient has suicidal thoughts or intents to harm or self or anyone else at this time. I was unable to reach patient at her home number listed, phone was shut off but I was able to leave voicemail. I contacted patients spouse who was listed under emergency contacts, when I spoke with him on the phone he stated that patient seemed alert and fine this morning before he had left residence. Patients spouse stated that he would contact patient so she can contact our office as well as her specialist so we can get an update on her overall well being.Message has been verbally delivered to Misty Zamora and to Dr. Marlan Palau CMA Webb Laws CMA. KW

## 2015-11-02 ENCOUNTER — Other Ambulatory Visit: Payer: Self-pay | Admitting: Pain Medicine

## 2015-11-02 ENCOUNTER — Other Ambulatory Visit: Payer: Self-pay | Admitting: Family Medicine

## 2015-11-02 ENCOUNTER — Telehealth: Payer: Self-pay | Admitting: Family Medicine

## 2015-11-02 ENCOUNTER — Telehealth: Payer: Self-pay | Admitting: *Deleted

## 2015-11-02 DIAGNOSIS — M5136 Other intervertebral disc degeneration, lumbar region: Secondary | ICD-10-CM

## 2015-11-02 DIAGNOSIS — M545 Low back pain, unspecified: Secondary | ICD-10-CM

## 2015-11-02 DIAGNOSIS — M5416 Radiculopathy, lumbar region: Secondary | ICD-10-CM

## 2015-11-02 DIAGNOSIS — M47816 Spondylosis without myelopathy or radiculopathy, lumbar region: Secondary | ICD-10-CM

## 2015-11-02 MED ORDER — PREDNISONE 20 MG PO TABS: ORAL_TABLET | ORAL | Status: DC

## 2015-11-02 NOTE — Telephone Encounter (Signed)
Patient wanted to give you an update: she reports that Cymbalta is helping with her mood. Patient states this morning when she woke up she had pain in her legs and back, patient reports pain in back is a stabbing feeling and now she is having numbness in toes. Patient states that she has in past got injections from Pain Management ( Dr. Primus Bravo), she reports she called Dr. Arnoldo Morale office as well this morning but has not heard back from him or his CMA. Patient has called out of work today's, she states she works 12 hr shifts standing on her feet the whole time and wants to know if you think she should decrease hrs at work due to pain? If so patient request that you write a doctors note to limit her activity at work.Patients request response back from you on what she should do for pain or is there anything else you can advise her to do?  KW

## 2015-11-02 NOTE — Telephone Encounter (Signed)
Spoke to patient to let her know that Misty Zamora will be calling her if she gets a PA for SNRB asked to be scheduled with [name].  Patient verbalizes u/o information. Pre procedure instructions given to patient.

## 2015-11-02 NOTE — Telephone Encounter (Signed)
Pt called stating that she woke up this morning in very bad pain. The pain is in her lower back it's like a stabbing pain that runs down her legs to her feet. Please call the pt. thanks

## 2015-11-02 NOTE — Telephone Encounter (Signed)
Discussed prednisone taper and increasing dose of Cymbalta on 6/2. She has call pending to her neurosurgeon. Suggested she wait and see how Cymbalta works prior to changing work hours.

## 2015-11-02 NOTE — Telephone Encounter (Signed)
Call to patient re; pain.  Patient states that she worked 12 hours on yesterday and woke up this morning with horrible back pain that is stabbing in the lower back and radiating down legs into toes and she is experiencing weakness in the left leg.  Spoke with [name], he is going to schedule patient for an SNRB.  Patient is already being prescribed pain medication and cymbalta by her primary care physician.

## 2015-11-02 NOTE — Telephone Encounter (Signed)
Pt called wanting to talk to [facility] regarding her visit with Mikki Santee the other day and her symptoms.  She would not be specific on what she needed to talk about.  Her callback is [phone]  Thanks Con Memos

## 2015-11-08 ENCOUNTER — Ambulatory Visit: Payer: 59 | Admitting: Pain Medicine

## 2015-11-10 ENCOUNTER — Inpatient Hospital Stay: Payer: 59

## 2015-11-10 ENCOUNTER — Inpatient Hospital Stay: Payer: 59 | Attending: Internal Medicine

## 2015-11-10 ENCOUNTER — Ambulatory Visit: Payer: 59 | Admitting: Family Medicine

## 2015-11-23 ENCOUNTER — Encounter: Payer: Self-pay | Admitting: Family Medicine

## 2015-11-23 ENCOUNTER — Ambulatory Visit: Payer: 59 | Admitting: Family Medicine

## 2015-11-23 ENCOUNTER — Ambulatory Visit (INDEPENDENT_AMBULATORY_CARE_PROVIDER_SITE_OTHER): Payer: 59 | Admitting: Family Medicine

## 2015-11-23 VITALS — BP 106/66 | HR 71 | Temp 98.6°F | Resp 16 | Wt 217.0 lb

## 2015-11-23 DIAGNOSIS — N939 Abnormal uterine and vaginal bleeding, unspecified: Secondary | ICD-10-CM

## 2015-11-23 DIAGNOSIS — F419 Anxiety disorder, unspecified: Secondary | ICD-10-CM | POA: Diagnosis not present

## 2015-11-23 DIAGNOSIS — R300 Dysuria: Secondary | ICD-10-CM

## 2015-11-23 LAB — POCT URINALYSIS DIPSTICK
BILIRUBIN UA: NEGATIVE
Blood, UA: NEGATIVE
GLUCOSE UA: NEGATIVE
Ketones, UA: NEGATIVE
LEUKOCYTES UA: NEGATIVE
NITRITE UA: NEGATIVE
PH UA: 6
Protein, UA: NEGATIVE
Spec Grav, UA: 1.015
Urobilinogen, UA: 0.2

## 2015-11-23 MED ORDER — DULOXETINE HCL 60 MG PO CPEP: ORAL_CAPSULE | ORAL | Status: DC

## 2015-11-23 NOTE — Patient Instructions (Signed)
Continue Cymbalta. Please see gyn regarding vaginal bleeding.

## 2015-11-23 NOTE — Progress Notes (Addendum)
Subjective:     Patient ID: Misty Zamora, female   DOB: 04/04/1976, 40 y.o.   MRN: [id number]  HPI  Chief Complaint  Patient presents with  . Pain    follow up  . Anxiety  . Vaginal Bleeding  . Dysuria  States that she feels better emotionally on Cymbalta 60 mg. Also took a cruise with her mother which helped her mood as well. Has appointment pending with Dr. Primus Bravo, pain clinic, for a sympathetic nerve block due to severe non-surgical back pain. Continues with hydrocodone 4 pills daily. Reports vaginal bleeding after intercourse and intermittent dysuria. S/p hysterectomy with hx of kidney stones.   Review of Systems     Objective:   Physical Exam  Constitutional: She appears well-developed and well-nourished. No distress.  Abdominal: Soft. There is no tenderness. There is no guarding.  Psychiatric: She has a normal mood and affect. Her behavior is normal.       Assessment:    1. Dysuria - POCT urinalysis dipstick  2. Vaginal bleeding: she will self refer to her gyn at [facility]   3. Anxiety - DULoxetine (CYMBALTA) 60 MG capsule; Take one daily  Dispense: 30 capsule; Refill: 5    Plan:    Continue Cymbalta and f/u with gyn. Will see her after her nerve ablation procedure.

## 2015-11-23 NOTE — Addendum Note (Signed)
Addended by: Quay Burow on: 11/23/2015 04:13 PM   Modules accepted: Miquel Dunn

## 2015-11-27 ENCOUNTER — Other Ambulatory Visit: Payer: Self-pay | Admitting: Family Medicine

## 2015-11-27 DIAGNOSIS — G8929 Other chronic pain: Secondary | ICD-10-CM

## 2015-11-27 DIAGNOSIS — M5116 Intervertebral disc disorders with radiculopathy, lumbar region: Secondary | ICD-10-CM

## 2015-11-27 DIAGNOSIS — R1013 Epigastric pain: Secondary | ICD-10-CM

## 2015-11-27 MED ORDER — HYDROCODONE-ACETAMINOPHEN 10-325 MG PO TABS: 1.0000 | ORAL_TABLET | ORAL | Status: DC | PRN

## 2015-11-27 NOTE — Telephone Encounter (Signed)
Pt requesting a refill of HYDROcodone-acetaminophen (NORCO) 10-325 MG tablet

## 2015-11-27 NOTE — Telephone Encounter (Signed)
Patient advised that prescription is ready for pick up. Prescription placed up at the front desk.

## 2015-12-13 ENCOUNTER — Ambulatory Visit: Payer: 59 | Attending: Pain Medicine | Admitting: Pain Medicine

## 2015-12-13 ENCOUNTER — Encounter: Payer: Self-pay | Admitting: Pain Medicine

## 2015-12-13 DIAGNOSIS — M5416 Radiculopathy, lumbar region: Secondary | ICD-10-CM | POA: Diagnosis not present

## 2015-12-13 DIAGNOSIS — M79606 Pain in leg, unspecified: Secondary | ICD-10-CM | POA: Diagnosis present

## 2015-12-13 DIAGNOSIS — M5136 Other intervertebral disc degeneration, lumbar region: Secondary | ICD-10-CM | POA: Diagnosis not present

## 2015-12-13 DIAGNOSIS — M47816 Spondylosis without myelopathy or radiculopathy, lumbar region: Secondary | ICD-10-CM

## 2015-12-13 DIAGNOSIS — M545 Low back pain: Secondary | ICD-10-CM | POA: Diagnosis present

## 2015-12-13 DIAGNOSIS — M5126 Other intervertebral disc displacement, lumbar region: Secondary | ICD-10-CM | POA: Insufficient documentation

## 2015-12-13 MED ORDER — ORPHENADRINE CITRATE 30 MG/ML IJ SOLN
60.0000 mg | Freq: Once | INTRAMUSCULAR | Status: AC
  Administered 2015-12-13: 60 mg via INTRAMUSCULAR
  Filled 2015-12-13: qty 2

## 2015-12-13 MED ORDER — LACTATED RINGERS IV SOLN: 1000.0000 mL | INTRAVENOUS | Status: DC

## 2015-12-13 MED ORDER — BUPIVACAINE HCL (PF) 0.25 % IJ SOLN
30.0000 mL | Freq: Once | INTRAMUSCULAR | Status: AC
  Administered 2015-12-13: 30 mL
  Filled 2015-12-13: qty 30

## 2015-12-13 MED ORDER — TRIAMCINOLONE ACETONIDE 40 MG/ML IJ SUSP
40.0000 mg | Freq: Once | INTRAMUSCULAR | Status: AC
  Administered 2015-12-13: 40 mg
  Filled 2015-12-13: qty 1

## 2015-12-13 MED ORDER — LIDOCAINE HCL (PF) 1 % IJ SOLN
10.0000 mL | Freq: Once | INTRAMUSCULAR | Status: AC
  Administered 2015-12-13: 10 mL via SUBCUTANEOUS
  Filled 2015-12-13: qty 10

## 2015-12-13 MED ORDER — MIDAZOLAM HCL 5 MG/5ML IJ SOLN
5.0000 mg | Freq: Once | INTRAMUSCULAR | Status: AC
  Administered 2015-12-13: 5 mg via INTRAVENOUS
  Filled 2015-12-13: qty 5

## 2015-12-13 MED ORDER — FENTANYL CITRATE (PF) 100 MCG/2ML IJ SOLN
100.0000 ug | Freq: Once | INTRAMUSCULAR | Status: AC
  Administered 2015-12-13: 100 ug via INTRAVENOUS
  Filled 2015-12-13: qty 2

## 2015-12-13 NOTE — Patient Instructions (Addendum)
PLAN  Continue present medications  F/U PCP R Chauvin for evaliation of  BP and general medical  condition  F/U surgical evaluation. Patient to follow-up with Dr. Arnoldo Morale for neurosurgical reevaluation as discussed  F/U neurological evaluation.   May consider radiofrequency rhizolysis or intraspinal procedures pending response to present treatment and F/U evaluation   Patient to call [facility] should patient have concerns prior to scheduled return appointmentPain Management Discharge Instructions  General Discharge Instructions :  If you need to reach your doctor call: Monday-Friday 8:00 am - 4:00 pm at [phone] or toll free [phone].  After clinic hours [phone] to have operator reach doctor.  Bring all of your medication bottles to all your appointments in the pain clinic.  To cancel or reschedule your appointment with Pain Management please remember to call 24 hours in advance to avoid a fee.  Refer to the educational materials which you have been given on: General Risks, I had my Procedure. Discharge Instructions, Post Sedation.  Post Procedure Instructions:  The drugs you were given will stay in your system until tomorrow, so for the next 24 hours you should not drive, make any legal decisions or drink any alcoholic beverages.  You may eat anything you prefer, but it is better to start with liquids then soups and crackers, and gradually work up to solid foods.  Please notify your doctor immediately if you have any unusual bleeding, trouble breathing or pain that is not related to your normal pain.  Depending on the type of procedure that was done, some parts of your body may feel week and/or numb.  This usually clears up by tonight or the next day.  Walk with the use of an assistive device or accompanied by an adult for the 24 hours.  You may use ice on the affected area for the first 24 hours.  Put ice in a Ziploc bag and cover with a towel and  place against area 15 minutes on 15 minutes off.  You may switch to heat after 24 hours.

## 2015-12-13 NOTE — Progress Notes (Signed)
Safety precautions to be maintained throughout the outpatient stay will include: orient to surroundings, keep bed in low position, maintain call bell within reach at all times, provide assistance with transfer out of bed and ambulation.  

## 2015-12-13 NOTE — Progress Notes (Signed)
Subjective:    Patient ID: Misty Zamora, female    DOB: 1975/12/15, 40 y.o.   MRN: [id number]  HPI  PROCEDURE PERFORMED: Lumbosacral selective nerve root block   NOTE: The patient is a 40 y.o. female who returns to [place] for further evaluation and treatment of pain involving the lumbar and lower extremity region. Studies have revealed the patient to be with evidence of  Degenerative disc disease lumbar spine IMPRESSION: LUMBAR MYELOGRAM IMPRESSION:  No nerve root cut off or spinal stenosis. No dynamic instability. Disc space narrowing L5-S1.  CT LUMBAR MYELOGRAM IMPRESSION:  Central and rightward protrusion L5-S1. Mild BILATERAL foraminal narrowing. No neural impingement is demonstrated.  LumbIMPRESSION: L4-5: Disc degeneration with bulging of the disc and mild facet hypertrophy but no stenosis or neural compression.  L5-S1: Disc herniation more prominent centrally and to the right, indenting the thecal sac and abutting the S1 root sleeves. Neural compression is not demonstrated but this could be associated with back pain or neural irritation. There is concern regarding intraspinal abnormalities contributing to the patient's symptomatology. There is concern regarding component of patient's pain being due to lumbar radiculopathy The risks, benefits, and expectations of the procedure have been explained to the patient who was understanding and in agreement with suggested treatment plan. We will proceed with interventional treatment as discussed and as explained to the patient. The patient is understanding and in agreement with suggested treatment plan.   DESCRIPTION OF PROCEDURE: Lumbosacral selective nerve root block with IV Versed, IV fentanyl conscious sedation, EKG, blood pressure, pulse, capnography, and pulse oximetry monitoring. The procedure was performed with the patient in the prone position under fluoroscopic guidance. With the patient in the prone  position, Betadine prep of proposed entry site was performed. Local anesthetic skin wheal of proposed needle entry site was prepared with 1.5% plain lidocaine with AP view of the lumbosacral spine.   PROCEDURE #1: Needle placement at the left L 2 vertebral body: A 22 -gauge needle was inserted at the inferior border of the transverse process of the vertebral body with needle placed medial to the midline of the transverse process on AP view of the lumbosacral spine.   NEEDLE PLACEMENT AT  L3, L4, and L5  VERTEBRAL BODY LEVELS  Needle  placement was accomplished at L3, L4, and L5  vertebral body levels on the left side exactly as was accomplished at the L2  vertebral body level  and utilizing the same technique and under fluoroscopic guidance.   Needle placement was then verified on lateral view at all levels with needle tip documented to be in the posterior superior quadrant of the intervertebral foramen of  L 2, L3, L4, and L5. Following negative aspiration for heme and CSF at each level, each level was injected with 3 mL of 0.25% bupivacaine with Kenalog.    The patient tolerated the procedure well. A total of 10 mg of Kenalog was utilized for the procedure.   PLAN:  1. Medications: Will continue presently prescribed medications. 2. The patient is to undergo follow-up evaluation with PCP Carmon Ginsberg for evaluation of blood pressure and general medical condition status post procedure performed on today's visit. 3. Surgical follow-up evaluation. Has been addressed 4. Neurological evaluation. May consider PNCV EMG studies and other studies 5. May consider radiofrequency procedures, implantation type procedures and other treatment pending response to treatment and follow-up evaluation. 6. The patient has been advised do adhere to proper body mechanics and avoid activities which may  aggravate condition. 7. The patient has been advised to call the Pain Management Center prior to scheduled return  appointment should there be significant change in the patient's condition or should the patient have other concerns regarding condition prior to scheduled return appointment.   Review of Systems     Objective:   Physical Exam        Assessment & Plan:

## 2015-12-14 ENCOUNTER — Telehealth: Payer: Self-pay | Admitting: *Deleted

## 2015-12-14 NOTE — Telephone Encounter (Signed)
No problems post procedure. 

## 2015-12-18 ENCOUNTER — Other Ambulatory Visit: Payer: Self-pay | Admitting: Family Medicine

## 2015-12-18 DIAGNOSIS — R102 Pelvic and perineal pain: Secondary | ICD-10-CM | POA: Diagnosis not present

## 2015-12-18 DIAGNOSIS — R1030 Lower abdominal pain, unspecified: Secondary | ICD-10-CM | POA: Diagnosis not present

## 2015-12-18 NOTE — Telephone Encounter (Signed)
Pt stated that she saw Dr. Primus Bravo and was advised to request Mikki Santee to send a release so that he can start prescribing pt's pain medication. Pt stated that she won't see Dr. Primus Bravo again until 01/02/16 and she will run out of HYDROcodone-acetaminophen ([place]) 10-325 MG tablet before 01/02/16. Pt wanted to request a refill for HYDROcodone-acetaminophen (NORCO) 10-325 MG tablet and then it is her understanding that Dr. Primus Bravo will then take over that medication. Please advise. Thanks TNP

## 2015-12-19 ENCOUNTER — Other Ambulatory Visit: Payer: Self-pay | Admitting: Family Medicine

## 2015-12-19 DIAGNOSIS — Z90710 Acquired absence of both cervix and uterus: Secondary | ICD-10-CM | POA: Diagnosis not present

## 2015-12-19 DIAGNOSIS — R188 Other ascites: Secondary | ICD-10-CM | POA: Diagnosis not present

## 2015-12-19 DIAGNOSIS — Z9884 Bariatric surgery status: Secondary | ICD-10-CM | POA: Diagnosis not present

## 2015-12-19 DIAGNOSIS — Z87891 Personal history of nicotine dependence: Secondary | ICD-10-CM | POA: Diagnosis not present

## 2015-12-19 DIAGNOSIS — R102 Pelvic and perineal pain: Secondary | ICD-10-CM | POA: Diagnosis not present

## 2015-12-19 DIAGNOSIS — Z87442 Personal history of urinary calculi: Secondary | ICD-10-CM | POA: Diagnosis not present

## 2015-12-19 DIAGNOSIS — N8311 Corpus luteum cyst of right ovary: Secondary | ICD-10-CM | POA: Diagnosis not present

## 2015-12-19 DIAGNOSIS — N939 Abnormal uterine and vaginal bleeding, unspecified: Secondary | ICD-10-CM | POA: Diagnosis not present

## 2015-12-19 NOTE — Telephone Encounter (Signed)
Pt called to see if her Rx is ready to be picked up/MW

## 2015-12-19 NOTE — Telephone Encounter (Signed)
The pain medication is not due for refill until 7/26-7/27. I will attend to it at that time and refill it to get you to the 8/ 1 appointment with Dr. Primus Bravo.

## 2015-12-20 DIAGNOSIS — Z87891 Personal history of nicotine dependence: Secondary | ICD-10-CM | POA: Diagnosis not present

## 2015-12-20 DIAGNOSIS — Z9884 Bariatric surgery status: Secondary | ICD-10-CM | POA: Diagnosis not present

## 2015-12-20 DIAGNOSIS — R102 Pelvic and perineal pain: Secondary | ICD-10-CM | POA: Diagnosis not present

## 2015-12-20 DIAGNOSIS — R1031 Right lower quadrant pain: Secondary | ICD-10-CM | POA: Diagnosis not present

## 2015-12-20 DIAGNOSIS — N8311 Corpus luteum cyst of right ovary: Secondary | ICD-10-CM | POA: Diagnosis not present

## 2015-12-20 DIAGNOSIS — Z87442 Personal history of urinary calculi: Secondary | ICD-10-CM | POA: Diagnosis not present

## 2015-12-20 DIAGNOSIS — Z90710 Acquired absence of both cervix and uterus: Secondary | ICD-10-CM | POA: Diagnosis not present

## 2015-12-20 DIAGNOSIS — N939 Abnormal uterine and vaginal bleeding, unspecified: Secondary | ICD-10-CM | POA: Diagnosis not present

## 2015-12-21 ENCOUNTER — Encounter: Payer: Self-pay | Admitting: Family Medicine

## 2015-12-21 ENCOUNTER — Ambulatory Visit (INDEPENDENT_AMBULATORY_CARE_PROVIDER_SITE_OTHER): Payer: 59 | Admitting: Family Medicine

## 2015-12-21 VITALS — BP 118/84 | HR 80 | Temp 98.1°F | Resp 16 | Wt 212.0 lb

## 2015-12-21 DIAGNOSIS — G8929 Other chronic pain: Secondary | ICD-10-CM

## 2015-12-21 DIAGNOSIS — R112 Nausea with vomiting, unspecified: Secondary | ICD-10-CM | POA: Diagnosis not present

## 2015-12-21 DIAGNOSIS — Z8679 Personal history of other diseases of the circulatory system: Secondary | ICD-10-CM | POA: Insufficient documentation

## 2015-12-21 DIAGNOSIS — R1013 Epigastric pain: Secondary | ICD-10-CM

## 2015-12-21 DIAGNOSIS — N83201 Unspecified ovarian cyst, right side: Secondary | ICD-10-CM | POA: Diagnosis not present

## 2015-12-21 MED ORDER — PROMETHAZINE HCL 25 MG PO TABS: 25.0000 mg | ORAL_TABLET | Freq: Four times a day (QID) | ORAL | Status: DC | PRN

## 2015-12-21 NOTE — Patient Instructions (Addendum)
I will fax the FMLA report and provide the original for your files. F/u with gyn as scheduled. Consider tapering off omeprazole (substitute Pepcid 20 twice daily and antacids as needed.) see if coming off omeprazole helps with your abdominal pain.

## 2015-12-21 NOTE — Telephone Encounter (Signed)
Pt has appointment today. Will discuss at that time. Renaldo Fiddler, CMA

## 2015-12-21 NOTE — Progress Notes (Signed)
Subjective:     Patient ID: Misty Zamora, female   DOB: May 21, 1976, 40 y.o.   MRN: [id number]  HPI  Chief Complaint  Patient presents with  . Abdominal Pain    Pt reports to [facility] ED this week with abdominal pain. Pt reports she was diagnosed with ovarian cyst. Is scheduled to see GYN tomorrow. Is also c/o vomiting and H/A. Needs FMLA paperwork filled out due to missing work secondary to pain.  States her symptoms started on or about 7/15. Was initially evaluated at [facility] ER 7/17 but left due to a long wait. Subsequently returned on 7/18 for complete evaluation. Has vomited once or twice since leaving the ER. She states she took more of her pain medication than prescribed and has run out 7-8 days early. I told her I could not refill until 7/26. She will be continuing with Dr. Primus Bravo at the pain clinic and has already received a nerve block for chronic back pain. She states he will assume prescribing duties if given writeen permission from myself. She has appointment pending also with [facility] regarding her chronic abdominal pain.   Review of Systems     Objective:   Physical Exam  Constitutional: She appears well-developed and well-nourished. No distress.  Psychiatric: She has a normal mood and affect. Her behavior is normal.       Assessment:    1. Cyst of right ovary  2. Abdominal pain, chronic, epigastric: will try see if coming slowly off omeprazole with improve her pain.  3. Non-intractable vomiting with nausea, vomiting of unspecified type - promethazine (PHENERGAN) 25 MG tablet; Take 1 tablet (25 mg total) by mouth every 6 (six) hours as needed for nausea or vomiting.  Dispense: 12 tablet; Refill: 0    Plan:    Try omeprazole taper. F/u with specialists. FMLA form to be complete reflecting intermittent absences for the above over the course of the year.

## 2015-12-22 DIAGNOSIS — L929 Granulomatous disorder of the skin and subcutaneous tissue, unspecified: Secondary | ICD-10-CM | POA: Diagnosis not present

## 2015-12-22 DIAGNOSIS — N83209 Unspecified ovarian cyst, unspecified side: Secondary | ICD-10-CM | POA: Diagnosis not present

## 2015-12-22 DIAGNOSIS — R971 Elevated cancer antigen 125 [CA 125]: Secondary | ICD-10-CM | POA: Diagnosis not present

## 2015-12-24 ENCOUNTER — Emergency Department
Admission: EM | Admit: 2015-12-24 | Discharge: 2015-12-25 | Payer: 59 | Attending: Emergency Medicine | Admitting: Emergency Medicine

## 2015-12-24 DIAGNOSIS — R1010 Upper abdominal pain, unspecified: Secondary | ICD-10-CM | POA: Diagnosis not present

## 2015-12-24 DIAGNOSIS — R197 Diarrhea, unspecified: Secondary | ICD-10-CM | POA: Diagnosis not present

## 2015-12-24 DIAGNOSIS — Z90710 Acquired absence of both cervix and uterus: Secondary | ICD-10-CM | POA: Insufficient documentation

## 2015-12-24 DIAGNOSIS — Z87891 Personal history of nicotine dependence: Secondary | ICD-10-CM | POA: Insufficient documentation

## 2015-12-24 DIAGNOSIS — F329 Major depressive disorder, single episode, unspecified: Secondary | ICD-10-CM | POA: Insufficient documentation

## 2015-12-24 DIAGNOSIS — R1084 Generalized abdominal pain: Secondary | ICD-10-CM | POA: Diagnosis not present

## 2015-12-24 DIAGNOSIS — I10 Essential (primary) hypertension: Secondary | ICD-10-CM | POA: Insufficient documentation

## 2015-12-24 DIAGNOSIS — Z8739 Personal history of other diseases of the musculoskeletal system and connective tissue: Secondary | ICD-10-CM | POA: Insufficient documentation

## 2015-12-24 DIAGNOSIS — Z79899 Other long term (current) drug therapy: Secondary | ICD-10-CM | POA: Insufficient documentation

## 2015-12-24 DIAGNOSIS — R112 Nausea with vomiting, unspecified: Secondary | ICD-10-CM | POA: Insufficient documentation

## 2015-12-24 LAB — CBC
HEMATOCRIT: 42.4 % (ref 35.0–47.0)
HEMOGLOBIN: 14.7 g/dL (ref 12.0–16.0)
MCH: 30.4 pg (ref 26.0–34.0)
MCHC: 34.6 g/dL (ref 32.0–36.0)
MCV: 87.8 fL (ref 80.0–100.0)
Platelets: 233 10*3/uL (ref 150–440)
RBC: 4.83 MIL/uL (ref 3.80–5.20)
RDW: 13.2 % (ref 11.5–14.5)
WBC: 8.3 10*3/uL (ref 3.6–11.0)

## 2015-12-24 LAB — URINALYSIS COMPLETE WITH MICROSCOPIC (ARMC ONLY)
Bilirubin Urine: NEGATIVE
Glucose, UA: NEGATIVE mg/dL
HGB URINE DIPSTICK: NEGATIVE
KETONES UR: NEGATIVE mg/dL
LEUKOCYTES UA: NEGATIVE
NITRITE: NEGATIVE
Protein, ur: NEGATIVE mg/dL
RBC / HPF: NONE SEEN RBC/hpf (ref 0–5)
SPECIFIC GRAVITY, URINE: 1.01 (ref 1.005–1.030)
WBC UA: NONE SEEN WBC/hpf (ref 0–5)
pH: 6 (ref 5.0–8.0)

## 2015-12-24 LAB — COMPREHENSIVE METABOLIC PANEL
ALT: 14 U/L (ref 14–54)
ANION GAP: 6 (ref 5–15)
AST: 16 U/L (ref 15–41)
Albumin: 4.2 g/dL (ref 3.5–5.0)
Alkaline Phosphatase: 51 U/L (ref 38–126)
BUN: 6 mg/dL (ref 6–20)
CHLORIDE: 107 mmol/L (ref 101–111)
CO2: 26 mmol/L (ref 22–32)
Calcium: 9 mg/dL (ref 8.9–10.3)
Creatinine, Ser: 0.76 mg/dL (ref 0.44–1.00)
Glucose, Bld: 83 mg/dL (ref 65–99)
POTASSIUM: 3.6 mmol/L (ref 3.5–5.1)
Sodium: 139 mmol/L (ref 135–145)
TOTAL PROTEIN: 7.2 g/dL (ref 6.5–8.1)
Total Bilirubin: 0.7 mg/dL (ref 0.3–1.2)

## 2015-12-24 LAB — LIPASE, BLOOD: LIPASE: 38 U/L (ref 11–51)

## 2015-12-24 LAB — TROPONIN I: Troponin I: <0.03 ng/mL (ref <0.03)

## 2015-12-24 MED ORDER — LORAZEPAM 2 MG/ML IJ SOLN
1.0000 mg | Freq: Once | INTRAMUSCULAR | Status: AC
  Administered 2015-12-24: 1 mg via INTRAVENOUS

## 2015-12-24 MED ORDER — LORAZEPAM 2 MG/ML IJ SOLN
INTRAMUSCULAR | Status: AC
  Filled 2015-12-24: qty 1

## 2015-12-24 MED ORDER — ONDANSETRON HCL 4 MG/2ML IJ SOLN
4.0000 mg | Freq: Once | INTRAMUSCULAR | Status: AC
  Administered 2015-12-24: 4 mg via INTRAVENOUS
  Filled 2015-12-24: qty 2

## 2015-12-24 MED ORDER — SODIUM CHLORIDE 0.9 % IV BOLUS (SEPSIS)
1000.0000 mL | Freq: Once | INTRAVENOUS | Status: AC
  Administered 2015-12-24: 1000 mL via INTRAVENOUS

## 2015-12-24 MED ORDER — HYDROMORPHONE HCL 1 MG/ML IJ SOLN
1.0000 mg | Freq: Once | INTRAMUSCULAR | Status: AC
  Administered 2015-12-24: 1 mg via INTRAVENOUS

## 2015-12-24 MED ORDER — ONDANSETRON HCL 4 MG/2ML IJ SOLN
4.0000 mg | Freq: Once | INTRAMUSCULAR | Status: AC
  Administered 2015-12-24: 4 mg via INTRAVENOUS

## 2015-12-24 MED ORDER — MORPHINE SULFATE (PF) 4 MG/ML IV SOLN
4.0000 mg | Freq: Once | INTRAVENOUS | Status: AC
  Administered 2015-12-24: 4 mg via INTRAVENOUS
  Filled 2015-12-24: qty 1

## 2015-12-24 NOTE — ED Provider Notes (Addendum)
[facility] Emergency Department Provider Note   ____________________________________________  Time seen: Approximately [id number] PM  I have reviewed the triage vital signs and the nursing notes.   HISTORY  Chief Complaint Abdominal Pain    HPI Misty Zamora is a 40 y.o. female with a history of gastric bypass surgery is presenting with upper abdominal pain. She says that the pain has been ongoing for 8 days and she had 2 episodes of projectile vomiting several days ago. Saying that she is also not having diarrhea. Was seen at [facility] and had a CAT scan as well as an ultrasound of the pelvis which did not reveal obstruction. Possible right-sided corpus luteum cyst. Patient with history of hysterectomy. Says that the pain is made out of 10 and nonradiating. Unable to characterize the pain as burning, cramping or sharp etc. Says it is constant.   Past Medical History:  Diagnosis Date  . Anemia    had iron infusions. last one in Sept. told levels are now normal  . Anxiety   . 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 Bulging lumbar disc    causes bilateral leg pain  . Depression   . GERD (gastroesophageal reflux disease)    occasional  . Heart murmur 2005  . History of cardiovascular stress test 2005   showed MR and TR  . History of kidney stones   . Hypertension    no meds since gastric bypass  . IDA (iron deficiency anemia)   . PONV (postoperative nausea and vomiting)    hypotension with epidural  . Sleep apnea    was better after gastric bypass, hasd recently started snoring again.  . Wears contact lenses     Patient Active Problem List   Diagnosis Date Noted  . Adiposity 12/21/2015  . H/O sinus tachycardia 12/21/2015  . DDD (degenerative disc disease), lumbar 10/03/2015  . Neuritis or radiculitis due to rupture of lumbar intervertebral disc 06/07/2015  . 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 Other intervertebral  disc displacement, lumbar region 06/07/2015  . Obesity 04/07/2015  . Allergic rhinitis 02/18/2015  . Anxiety 02/18/2015  . H/O renal calculi 02/18/2015  . 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 Multinodular goiter 01/02/2015  . Status post laparoscopic hysterectomy 12/27/2014  . Chronic female pelvic pain 12/08/2014  . IDA (iron deficiency anemia) 12/01/2014  . Abdominal pain, chronic, epigastric 01/29/2012    Past Surgical History:  Procedure Laterality Date  . ABDOMINAL HYSTERECTOMY  12/2014   ovaries in situ  . BILATERAL SALPINGECTOMY Bilateral 12/27/2014   Procedure: BILATERAL SALPINGECTOMY;  Surgeon: Will Bonnet, MD;  Location: [facility] ORS;  Service: Gynecology;  Laterality: Bilateral;  . COLONOSCOPY  2010  . CYSTOSCOPY N/A 12/27/2014   Procedure: CYSTOSCOPY;  Surgeon: Will Bonnet, MD;  Location: [facility] ORS;  Service: Gynecology;  Laterality: N/A;  . DILATION AND CURETTAGE OF UTERUS    . ELBOW ARTHROSCOPY Left 03/30/2015   Procedure: ARTHROSCOPY ELBOW WITH DEBRIDEMENT OF THE SYMPTOMATIC PLICA;  Surgeon: Corky Mull, MD;  Location: [place];  Service: Orthopedics;  Laterality: Left;  . ELBOW SURGERY Left   . ERCP    . gallbladder removed    . GASTRIC BYPASS  2012  . LAPAROSCOPIC HYSTERECTOMY N/A 12/27/2014   Procedure: HYSTERECTOMY TOTAL LAPAROSCOPIC;  Surgeon: Will Bonnet, MD;  Location: [facility] ORS;  Service: Gynecology;  Laterality: N/A;  . laproscopy    . RENAL ENDOSCOPY VIA NEPHROSTOMY /  PYELOSTOMY  2010  . TUBAL LIGATION      Current Outpatient Rx  . Order #: [id number] Class: Historical Med  . Order #: [id number] Class: Phone In  . Order #: [id number] Class: Normal  . Order #: [id number] Class: Normal  . Order #: [id number] Class: Print  . Order #: [id number] Class: Historical Med  . Order #: [id number] Class: Normal  . Order #: [id number] Class: Normal    Allergies Ketorolac  Family History  Problem Relation Age of Onset  . Hypertension Mother   .  Hyperlipidemia Mother   . Diabetes Mother   . Cancer Mother     breast cancer; 04/2011  . Colon polyps Mother   . Breast cancer Mother 3  . Hyperlipidemia Father   . Hypertension Father   . Colon polyps Father   . Hypertension Sister   . Hypertension Brother   . Cancer Maternal Uncle     terminal lung cancer  . Breast cancer Maternal Aunt   . Breast cancer Paternal Aunt   . Breast cancer Paternal Grandmother     Social History Social History  Substance Use Topics  . Smoking status: Former Smoker    Packs/day: 0.00    Years: 8.00    Types: Cigarettes    Quit date: 06/04/1999  . Smokeless tobacco: Never Used  . Alcohol use No     Comment: 1 drink every few months    Review of Systems Constitutional: No fever/chills Eyes: No visual changes. ENT: No sore throat. Cardiovascular: Denies chest pain. Respiratory: Denies shortness of breath. Gastrointestinal:   No constipation. Genitourinary: Negative for dysuria. Musculoskeletal: Negative for back pain. Skin: Negative for rash. Neurological: Negative for headaches, focal weakness or numbness.  10-point ROS otherwise negative.  ____________________________________________   PHYSICAL EXAM:  VITAL SIGNS: ED Triage Vitals  Enc Vitals Group     BP 12/24/15 2039 135/89     Pulse Rate 12/24/15 2039 73     Resp 12/24/15 2039 20     Temp 12/24/15 2039 98.4 F (36.9 C)     Temp Source 12/24/15 2039 Oral     SpO2 12/24/15 2039 100 %     Weight 12/24/15 2039 207 lb (93.9 kg)     Height 12/24/15 2039 5\' 9"  (1.753 m)     Head Circumference --      Peak Flow --      Pain Score 12/24/15 2040 8     Pain Loc --      Pain Edu? --      Excl. in [place]? --     Constitutional: Alert and oriented. Well appearing and in no acute distress. Eyes: Conjunctivae are normal. PERRL. EOMI. Head: Atraumatic. Nose: No congestion/rhinnorhea. Mouth/Throat: Mucous membranes are moist.   Neck: No stridor.   Cardiovascular: Normal rate,  regular rhythm. Grossly normal heart sounds.   Respiratory: Normal respiratory effort.  No retractions. Lungs CTAB. Gastrointestinal: Soft with upper abdominal tenderness which is mild. Negative Murphy sign. No distention. No CVA tenderness. Musculoskeletal: No lower extremity tenderness nor edema.  No joint effusions. Neurologic:  Normal speech and language. No gross focal neurologic deficits are appreciated.  Skin:  Skin is warm, dry and intact. No rash noted. Psychiatric: Mood and affect are normal. Speech and behavior are normal.  ____________________________________________   LABS (all labs ordered are listed, but only abnormal results are displayed)  Labs Reviewed  URINALYSIS COMPLETEWITH MICROSCOPIC ([place]) - Abnormal; Notable for the following:       Result Value   Color,  Urine YELLOW (*)    APPearance CLEAR (*)    Bacteria, UA RARE (*)    Squamous Epithelial / LPF 0-5 (*)    All other components within normal limits  LIPASE, BLOOD  COMPREHENSIVE METABOLIC PANEL  CBC  TROPONIN I  POC URINE PREG, ED   ____________________________________________  EKG  ED ECG REPORT I, Lucresia Simic,  Youlanda Roys, the attending physician, personally viewed and interpreted this ECG.   Date: 12/25/2015  EKG Time: 2231  Rate: 71  Rhythm: normal sinus rhythm  Axis: Normal axis  Intervals:none  ST&T Change: no st segment elevation or depression. No abnormal T-wave inversion.  ____________________________________________  RADIOLOGY  CAT scan down at [facility]. Unable to obtain imaging here. Attended to obtain MRI but due to the number of MRIs because of the down to CAT scan her radiology was unable to accommodate. Discussed this with Dr. Pascal Lux of radiology. ____________________________________________   PROCEDURES  Procedures  ____________________________________________   INITIAL IMPRESSION / ASSESSMENT AND PLAN / ED COURSE  Pertinent labs & imaging  results that were available during my care of the patient were reviewed by me and considered in my medical decision making (see chart for details).  ----------------------------------------- 12:19 AM on 12/25/2015 -----------------------------------------  Patient says that the morphine did not relieve her pain. She then received Ativan but says that did not help either. Reassessed in patient's abdomen is still mildly tender and she has not any distress. However, subjectively she is still complaining of a 10 pain. We will attempt to transfer her to the service of her surgeon, Dr. Duke Salvia. Awaiting callback from Dr. Bernette Mayers service at this time. Signed out to Dr. Kerman Passey. ____________________________________________   FINAL CLINICAL IMPRESSION(S) / ED DIAGNOSES  Final diagnoses:  Upper abdominal pain   Nausea vomiting and diarrhea.   NEW MEDICATIONS STARTED DURING THIS VISIT:  New Prescriptions   No medications on file     Note:  This document was prepared using Dragon voice recognition software and may include unintentional dictation errors.    Orbie Pyo, MD 12/25/15 0020  Discussed case with PA Drinkwater with the service of Dr. Duke Salvia who is the patient's bariatric surgeon. PA Drinkwater says that as long as the labs are within normal limits and the exam is benign that the patient can follow-up as an outpatient in the morning and likely get the imaging done as an outpatient. I suggested that we transfer her for imaging because we are not able to image because the CAT scan was malfunctioning and he thought that would be inappropriate at this time. The patient was then counseled about the conversation and she says that she is still feeling ill and does not think she would be well not to drive to the office tomorrow morning to see Dr. Duke Salvia in clinic. We will coordinate CAT scan this evening.   Orbie Pyo, MD 12/25/15 [phone]  Signed out to Dr.  Kerman Passey.   Orbie Pyo, MD 12/25/15 [phone]

## 2015-12-24 NOTE — ED Notes (Signed)
Report given to April, RN

## 2015-12-24 NOTE — ED Notes (Signed)
Report from [place], rn.

## 2015-12-24 NOTE — ED Notes (Signed)
Pt requesting anxiety medication, pain medication and nausea medication. md notified, order for ativan received.

## 2015-12-24 NOTE — ED Triage Notes (Signed)
Patient reports has had abdominal pain for the past 8 days.  Started at lower/pelvic pain and then moved upward.  Was seen at [facility] on Tuesday and dx'd with right ovarian cyst and fluid in pelvis.  Patient reports that she has not felt well even after being there - reports weakness, vomiting and fever.

## 2015-12-25 ENCOUNTER — Emergency Department (HOSPITAL_COMMUNITY): Payer: 59

## 2015-12-25 ENCOUNTER — Encounter (HOSPITAL_COMMUNITY): Payer: Self-pay

## 2015-12-25 ENCOUNTER — Emergency Department (HOSPITAL_COMMUNITY)
Admission: EM | Admit: 2015-12-25 | Discharge: 2015-12-25 | Disposition: A | Discharge status: Home / Self Care | Payer: 59 | Attending: Emergency Medicine | Admitting: Emergency Medicine

## 2015-12-25 DIAGNOSIS — R110 Nausea: Secondary | ICD-10-CM

## 2015-12-25 DIAGNOSIS — Z90710 Acquired absence of both cervix and uterus: Secondary | ICD-10-CM | POA: Diagnosis not present

## 2015-12-25 DIAGNOSIS — R112 Nausea with vomiting, unspecified: Secondary | ICD-10-CM | POA: Insufficient documentation

## 2015-12-25 DIAGNOSIS — Z87891 Personal history of nicotine dependence: Secondary | ICD-10-CM | POA: Insufficient documentation

## 2015-12-25 DIAGNOSIS — R197 Diarrhea, unspecified: Secondary | ICD-10-CM | POA: Diagnosis not present

## 2015-12-25 DIAGNOSIS — R109 Unspecified abdominal pain: Secondary | ICD-10-CM | POA: Diagnosis not present

## 2015-12-25 DIAGNOSIS — Z9989 Dependence on other enabling machines and devices: Secondary | ICD-10-CM | POA: Diagnosis not present

## 2015-12-25 DIAGNOSIS — R1010 Upper abdominal pain, unspecified: Secondary | ICD-10-CM | POA: Diagnosis not present

## 2015-12-25 DIAGNOSIS — I10 Essential (primary) hypertension: Secondary | ICD-10-CM | POA: Insufficient documentation

## 2015-12-25 DIAGNOSIS — R6889 Other general symptoms and signs: Secondary | ICD-10-CM | POA: Diagnosis not present

## 2015-12-25 DIAGNOSIS — Z79899 Other long term (current) drug therapy: Secondary | ICD-10-CM | POA: Insufficient documentation

## 2015-12-25 DIAGNOSIS — Z8739 Personal history of other diseases of the musculoskeletal system and connective tissue: Secondary | ICD-10-CM | POA: Diagnosis not present

## 2015-12-25 DIAGNOSIS — F329 Major depressive disorder, single episode, unspecified: Secondary | ICD-10-CM | POA: Diagnosis not present

## 2015-12-25 MED ORDER — METOCLOPRAMIDE HCL 10 MG PO TABS: 10.0000 mg | ORAL_TABLET | Freq: Four times a day (QID) | ORAL | 0 refills | Status: DC

## 2015-12-25 MED ORDER — METOCLOPRAMIDE HCL 5 MG/ML IJ SOLN
10.0000 mg | Freq: Once | INTRAMUSCULAR | Status: AC
  Administered 2015-12-25: 10 mg via INTRAVENOUS
  Filled 2015-12-25: qty 2

## 2015-12-25 MED ORDER — HYDROMORPHONE HCL 1 MG/ML IJ SOLN
INTRAMUSCULAR | Status: AC
  Filled 2015-12-25: qty 1

## 2015-12-25 MED ORDER — ONDANSETRON HCL 4 MG/2ML IJ SOLN
INTRAMUSCULAR | Status: AC
  Filled 2015-12-25: qty 2

## 2015-12-25 MED ORDER — HYDROMORPHONE HCL 1 MG/ML IJ SOLN
0.5000 mg | Freq: Once | INTRAMUSCULAR | Status: AC
  Administered 2015-12-25: 0.5 mg via INTRAVENOUS
  Filled 2015-12-25: qty 1

## 2015-12-25 MED ORDER — IOPAMIDOL (ISOVUE-300) INJECTION 61%
INTRAVENOUS | Status: AC
  Administered 2015-12-25: 100 mL
  Filled 2015-12-25: qty 100

## 2015-12-25 MED ORDER — DIATRIZOATE MEGLUMINE & SODIUM 66-10 % PO SOLN
ORAL | Status: AC
  Filled 2015-12-25: qty 30

## 2015-12-25 MED ORDER — ONDANSETRON HCL 4 MG/2ML IJ SOLN
4.0000 mg | Freq: Once | INTRAMUSCULAR | Status: AC
  Administered 2015-12-25: 4 mg via INTRAVENOUS
  Filled 2015-12-25: qty 2

## 2015-12-25 MED ORDER — HYDROMORPHONE HCL 1 MG/ML IJ SOLN
1.0000 mg | Freq: Once | INTRAMUSCULAR | Status: AC
  Administered 2015-12-25: 1 mg via INTRAVENOUS
  Filled 2015-12-25: qty 1

## 2015-12-25 MED ORDER — PROMETHAZINE HCL 25 MG/ML IJ SOLN
12.5000 mg | Freq: Once | INTRAMUSCULAR | Status: AC
  Administered 2015-12-25: 12.5 mg via INTRAVENOUS
  Filled 2015-12-25: qty 1

## 2015-12-25 MED ORDER — HYDROMORPHONE HCL 1 MG/ML IJ SOLN
1.0000 mg | Freq: Once | INTRAMUSCULAR | Status: AC
  Administered 2015-12-25: 1 mg via INTRAVENOUS

## 2015-12-25 NOTE — ED Notes (Signed)
Pt. Given copy of CT results as well as disc with CT scan on it.

## 2015-12-25 NOTE — ED Provider Notes (Signed)
-----------------------------------------   1:05 AM on 12/25/2015 -----------------------------------------  I discussed the patient with Martyn Malay who has accepted the patient ER to ER transfer for her abdominal pain and repeat CT scan to rule out small bowel obstruction. I discussed this with the patient who is agreeable to this plan.   Harvest Dark, MD 12/25/15 [phone]

## 2015-12-25 NOTE — ED Notes (Signed)
Taken to CT at this time. 

## 2015-12-25 NOTE — ED Provider Notes (Signed)
[place] DEPT Provider Note   CSN: [id number] Arrival date & time: 12/25/15  0214  First Provider Contact:  None       History   Chief Complaint Chief Complaint  Patient presents with  . Abdominal Pain    HPI Misty Zamora is a 40 y.o. female.  Patient with history of gastric bypass with subsequent adhesions and bowel obstruction presents with abdominal pain that started across lower abdomen and moved, during the course of the last 8 days, to the upper abdomen. She has had nausea with vomiting, which she describes as "projectile". No fever. She reports diarrhea in the last 2 days only. She was seen at [facility] several days ago where she reports a CT abd/pel was done, reported as negative for obstruction. She was discharged home with return precautions of worsening pain. She arrives here after evaluation at [facility] ED tonight for repeat CT scan of abdomen to r/o interim development of obstruction as their CT scanner was not available.    The history is provided by the patient. No language interpreter was used.  Abdominal Pain   This is a new problem. The current episode started more than 1 week ago. The problem has been gradually worsening. Associated symptoms include diarrhea, nausea and vomiting. Pertinent negatives include fever and dysuria.    Past Medical History:  Diagnosis Date  . Anemia    had iron infusions. last one in Sept. told levels are now normal  . Anxiety   . 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 Bulging lumbar disc    causes bilateral leg pain  . Depression   . GERD (gastroesophageal reflux disease)    occasional  . Heart murmur 2005  . History of cardiovascular stress test 2005   showed MR and TR  . History of kidney stones   . Hypertension    no meds since gastric bypass  . IDA (iron deficiency anemia)   . PONV (postoperative nausea and vomiting)    hypotension with epidural  . Sleep apnea    was better after gastric bypass,  hasd recently started snoring again.  . Wears contact lenses     Patient Active Problem List   Diagnosis Date Noted  . Adiposity 12/21/2015  . H/O sinus tachycardia 12/21/2015  . DDD (degenerative disc disease), lumbar 10/03/2015  . Neuritis or radiculitis due to rupture of lumbar intervertebral disc 06/07/2015  . 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 Other intervertebral disc displacement, lumbar region 06/07/2015  . Obesity 04/07/2015  . Allergic rhinitis 02/18/2015  . Anxiety 02/18/2015  . H/O renal calculi 02/18/2015  . 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 Multinodular goiter 01/02/2015  . Status post laparoscopic hysterectomy 12/27/2014  . Chronic female pelvic pain 12/08/2014  . IDA (iron deficiency anemia) 12/01/2014  . Abdominal pain, chronic, epigastric 01/29/2012    Past Surgical History:  Procedure Laterality Date  . ABDOMINAL HYSTERECTOMY  12/2014   ovaries in situ  . BILATERAL SALPINGECTOMY Bilateral 12/27/2014   Procedure: BILATERAL SALPINGECTOMY;  Surgeon: Will Bonnet, MD;  Location: [facility] ORS;  Service: Gynecology;  Laterality: Bilateral;  . COLONOSCOPY  2010  . CYSTOSCOPY N/A 12/27/2014   Procedure: CYSTOSCOPY;  Surgeon: Will Bonnet, MD;  Location: [facility] ORS;  Service: Gynecology;  Laterality: N/A;  . DILATION AND CURETTAGE OF UTERUS    . ELBOW ARTHROSCOPY Left 03/30/2015   Procedure: ARTHROSCOPY ELBOW WITH DEBRIDEMENT OF THE SYMPTOMATIC PLICA;  Surgeon: Corky Mull, MD;  Location: [place];  Service: Orthopedics;  Laterality: Left;  . ELBOW SURGERY Left   . ERCP    . gallbladder removed    . GASTRIC BYPASS  2012  . LAPAROSCOPIC HYSTERECTOMY N/A 12/27/2014   Procedure: HYSTERECTOMY TOTAL LAPAROSCOPIC;  Surgeon: Will Bonnet, MD;  Location: [facility] ORS;  Service: Gynecology;  Laterality: N/A;  . laproscopy    . RENAL ENDOSCOPY VIA NEPHROSTOMY / PYELOSTOMY  2010  . TUBAL LIGATION      OB History    Gravida Para Term Preterm  AB Living   5 3           SAB TAB Ectopic Multiple Live Births                  Obstetric Comments   NSVD x 3 SAB x 2       Home Medications    Prior to Admission medications   Medication Sig Zamora Date End Date Taking? Authorizing Provider  clonazePAM (KLONOPIN) 0.5 MG tablet TAKE 1 TABLET BY MOUTH 2 TIMES DAILY AS NEEDED FOR ANXIETY 10/04/15  Yes Carmon Ginsberg, PA  DULoxetine (CYMBALTA) 60 MG capsule Take one daily 11/23/15  Yes Carmon Ginsberg, PA  furosemide (LASIX) 20 MG tablet Take 1 tablet (20 mg total) by mouth daily as needed for edema. 10/02/15  Yes Erma Heritage, PA  omeprazole (PRILOSEC) 20 MG capsule Take 20 mg by mouth daily.    Yes Historical Provider, MD  propranolol (INDERAL) 10 MG tablet Take 1 tablet (10 mg total) by mouth 3 (three) times daily as needed. Patient taking differently: Take 10 mg by mouth 3 (three) times daily as needed (rapid heart rate).  07/25/15  Yes Minna Merritts, MD  promethazine (PHENERGAN) 25 MG tablet Take 1 tablet (25 mg total) by mouth every 6 (six) hours as needed for nausea or vomiting. Patient not taking: Reported on 12/25/2015 12/21/15   Carmon Ginsberg, PA    Family History Family History  Problem Relation Age of Onset  . Hypertension Mother   . Hyperlipidemia Mother   . Diabetes Mother   . Cancer Mother     breast cancer; 04/2011  . Colon polyps Mother   . Breast cancer Mother 31  . Hyperlipidemia Father   . Hypertension Father   . Colon polyps Father   . Hypertension Sister   . Hypertension Brother   . Cancer Maternal Uncle     terminal lung cancer  . Breast cancer Maternal Aunt   . Breast cancer Paternal Aunt   . Breast cancer Paternal Grandmother     Social History Social History  Substance Use Topics  . Smoking status: Former Smoker    Packs/day: 0.00    Years: 8.00    Types: Cigarettes    Quit date: 06/04/1999  . Smokeless tobacco: Never Used  . Alcohol use No     Comment: 1 drink every few months      Allergies   Ketorolac   Review of Systems Review of Systems  Constitutional: Positive for appetite change. Negative for fever.  Respiratory: Negative.  Negative for shortness of breath.   Cardiovascular: Negative.  Negative for chest pain.  Gastrointestinal: Positive for abdominal pain, diarrhea, nausea and vomiting. Negative for blood in stool.  Genitourinary: Negative.  Negative for dysuria, pelvic pain and vaginal discharge.  Musculoskeletal: Negative.   Skin: Negative.   Neurological: Negative.      Physical Exam Updated Vital Signs BP 121/68  Pulse 65   Temp 98.6 F (37 C)   Resp 18   Ht 5\' 9"  (1.753 m)   Wt 93.9 kg   LMP 12/14/2014 (Exact Date)   SpO2 100%   BMI 30.57 kg/m   Physical Exam  Constitutional: She appears well-developed and well-nourished.  HENT:  Head: Normocephalic.  Neck: Normal range of motion. Neck supple.  Cardiovascular: Normal rate and regular rhythm.   Pulmonary/Chest: Effort normal and breath sounds normal.  Abdominal: Soft. There is no tenderness. There is no rebound and no guarding.  Bowel sound hypoactive.   Musculoskeletal: Normal range of motion.  Neurological: She is alert. No cranial nerve deficit.  Skin: Skin is warm and dry. No rash noted.  Psychiatric: She has a normal mood and affect.     ED Treatments / Results  Labs (all labs ordered are listed, but only abnormal results are displayed) Labs Reviewed - No data to display  EKG  EKG Interpretation None       Radiology No results found. Ct Abdomen Pelvis W Contrast  Result Date: 12/25/2015 CLINICAL DATA:  40 year old female with abdominal pain. History of gastric bypass 6 years ago. Patient complaining of diarrhea and chills. EXAM: CT ABDOMEN AND PELVIS WITH CONTRAST TECHNIQUE: Multidetector CT imaging of the abdomen and pelvis was performed using the standard protocol following bolus administration of intravenous contrast. CONTRAST:  123mL ISOVUE-300  IOPAMIDOL (ISOVUE-300) INJECTION 61% COMPARISON:  CT dated 12/09/2014 FINDINGS: The visualized lung bases are clear. There is no cardiomegaly or pericardial effusion. No intra-abdominal free air. Trace free fluid within the pelvis. Cholecystectomy. There is mild dilatation of the CBD with normal tapering at the head of the pancreas. The liver, pancreas, spleen, adrenal glands, kidneys, visualized ureters, and urinary bladder appear unremarkable. Hysterectomy. There is a 2 cm dominant right ovarian follicle/cyst. Ultrasound may provide better evaluation of the pelvic structures. There is postsurgical changes of gastric bypass. No evidence of bowel obstruction or active inflammation. Normal appendix. The abdominal aorta and IVC appear unremarkable. No portal venous gas identified. There is no adenopathy. There is a small fat containing supraumbilical hernia. The abdominal wall soft tissues are otherwise unremarkable. The osseous structures are intact. IMPRESSION: No acute intra-abdominal pelvic pathology. A 2 cm dominant right ovarian follicle/ cyst. Electronically Signed   By: Anner Crete M.D.   On: 12/25/2015 06:28   Procedures Procedures (including critical care time)  Medications Ordered in ED Medications  diatrizoate meglumine-sodium (GASTROGRAFIN) 66-10 % solution (not administered)  HYDROmorphone (DILAUDID) injection 0.5 mg (0.5 mg Intravenous Given 12/25/15 0257)  ondansetron (ZOFRAN) injection 4 mg (4 mg Intravenous Given 12/25/15 0257)  promethazine (PHENERGAN) injection 12.5 mg (12.5 mg Intravenous Given 12/25/15 0355)  iopamidol (ISOVUE-300) 61 % injection (100 mLs  Contrast Given 12/25/15 0557)  HYDROmorphone (DILAUDID) injection 1 mg (1 mg Intravenous Given 12/25/15 0546)     Initial Impression / Assessment and Plan / ED Course  I have reviewed the triage vital signs and the nursing notes.  Pertinent labs & imaging results that were available during my care of the patient were  reviewed by me and considered in my medical decision making (see chart for details).  Clinical Course    Patient arrives from [facility] ED for CT scan of abd/pel to r/o obstruction with h/o gastric bypass and previous SBO. Labs were done at [facility] and reviewed, not repeated. Pain was addressed throughout ED stay.   VS remained stable. CT scan performed and found negative for obstruction or  other pathology. Results provided to patient. Can reasonably discharge home with GI follow up as planned.   Final Clinical Impressions(s) / ED Diagnoses   Final diagnoses:  None  1. Abdominal pain  New Prescriptions New Prescriptions   No medications on file     Charlann Lange, Hershal Coria 12/30/15 2106    Varney Biles, MD 01/02/16 0028

## 2015-12-25 NOTE — ED Notes (Signed)
Waiting for cab voucher for d/c

## 2015-12-25 NOTE — ED Notes (Signed)
Pt states no improvement in pain or nausea. md notified, new orders received.

## 2015-12-25 NOTE — ED Triage Notes (Signed)
Pt is a transfer from [place], c/o abd pain since last Sat. Pt had gastric bypass surgery 6 years ago and doctor wanted her to have a repeat CT. C/o diarrhea for the past two days, and chills.

## 2015-12-26 ENCOUNTER — Telehealth: Payer: Self-pay | Admitting: Emergency Medicine

## 2015-12-26 ENCOUNTER — Other Ambulatory Visit: Payer: Self-pay | Admitting: Family Medicine

## 2015-12-26 DIAGNOSIS — R1084 Generalized abdominal pain: Secondary | ICD-10-CM | POA: Diagnosis not present

## 2015-12-26 DIAGNOSIS — R109 Unspecified abdominal pain: Secondary | ICD-10-CM | POA: Diagnosis not present

## 2015-12-26 DIAGNOSIS — Z87891 Personal history of nicotine dependence: Secondary | ICD-10-CM | POA: Diagnosis not present

## 2015-12-26 DIAGNOSIS — R0789 Other chest pain: Secondary | ICD-10-CM | POA: Diagnosis not present

## 2015-12-26 DIAGNOSIS — M549 Dorsalgia, unspecified: Principal | ICD-10-CM

## 2015-12-26 DIAGNOSIS — F419 Anxiety disorder, unspecified: Secondary | ICD-10-CM

## 2015-12-26 DIAGNOSIS — R112 Nausea with vomiting, unspecified: Secondary | ICD-10-CM | POA: Diagnosis not present

## 2015-12-26 DIAGNOSIS — G8929 Other chronic pain: Secondary | ICD-10-CM

## 2015-12-26 DIAGNOSIS — R509 Fever, unspecified: Secondary | ICD-10-CM | POA: Diagnosis not present

## 2015-12-26 MED ORDER — HYDROCODONE-ACETAMINOPHEN 10-325 MG PO TABS: ORAL_TABLET | ORAL | 0 refills | Status: DC

## 2015-12-26 NOTE — Telephone Encounter (Signed)
States vomiting and diarrhea have abated. Remains nauseous (using Phenergan) along with low grade fevers, sweats and abdominal pain. She will be seeing her [facility] Bariatric M.D., Dr. Jamelle Haring, in the next 24 hours. I have also suggested that this may be narcotic withdrawal as she ran out of her hydrocodone early and will not be due to refill until 7/26, She has appointment pending with her pain clinic M.D., Dr. Primus Bravo. In 8/1.

## 2015-12-26 NOTE — Telephone Encounter (Signed)
Pt has been to ER twice in 11 days. And is not getting any better. She has low grade fever, chills, abdominal pain, diarrhea (for 3-4 days) and feeling really bad. She would like to know what you think she should do next? They did a CT in the ER and it did not show anything emergent (per pt). She wanted to know if she needed stool samples or anything. Informed pt that she would probably need to be seen and have a ER follow up. But she wanted me to ask. Please advise. Thanks.

## 2015-12-27 ENCOUNTER — Other Ambulatory Visit: Payer: Self-pay | Admitting: Family Medicine

## 2015-12-27 DIAGNOSIS — K589 Irritable bowel syndrome without diarrhea: Secondary | ICD-10-CM | POA: Diagnosis not present

## 2015-12-27 NOTE — Telephone Encounter (Signed)
Called in prescription to [facility]. KW

## 2016-01-01 ENCOUNTER — Telehealth: Payer: Self-pay | Admitting: Urology

## 2016-01-01 NOTE — Telephone Encounter (Signed)
Patient last saw brandon and lindsay in 2016 she has an appt with shannon on 01-08-16 for pelvic pressure. She thinks that her bladder is falling out. She feels that it is falling out she says she can feel it and want to make sure she is ok.  Can you call her please   Misty Zamora

## 2016-01-02 ENCOUNTER — Ambulatory Visit: Payer: 59 | Attending: Pain Medicine | Admitting: Pain Medicine

## 2016-01-02 ENCOUNTER — Encounter: Payer: Self-pay | Admitting: Pain Medicine

## 2016-01-02 VITALS — BP 129/82 | HR 71 | Temp 98.9°F | Resp 16 | Ht 69.0 in | Wt 207.0 lb

## 2016-01-02 DIAGNOSIS — M79606 Pain in leg, unspecified: Secondary | ICD-10-CM | POA: Diagnosis present

## 2016-01-02 DIAGNOSIS — M62830 Muscle spasm of back: Secondary | ICD-10-CM | POA: Insufficient documentation

## 2016-01-02 DIAGNOSIS — M533 Sacrococcygeal disorders, not elsewhere classified: Secondary | ICD-10-CM | POA: Insufficient documentation

## 2016-01-02 DIAGNOSIS — M51369 Other intervertebral disc degeneration, lumbar region without mention of lumbar back pain or lower extremity pain: Secondary | ICD-10-CM

## 2016-01-02 DIAGNOSIS — M545 Low back pain: Secondary | ICD-10-CM | POA: Insufficient documentation

## 2016-01-02 DIAGNOSIS — G8929 Other chronic pain: Secondary | ICD-10-CM

## 2016-01-02 DIAGNOSIS — M549 Dorsalgia, unspecified: Secondary | ICD-10-CM

## 2016-01-02 DIAGNOSIS — M5116 Intervertebral disc disorders with radiculopathy, lumbar region: Secondary | ICD-10-CM | POA: Diagnosis not present

## 2016-01-02 DIAGNOSIS — M5126 Other intervertebral disc displacement, lumbar region: Secondary | ICD-10-CM | POA: Insufficient documentation

## 2016-01-02 DIAGNOSIS — M5136 Other intervertebral disc degeneration, lumbar region: Secondary | ICD-10-CM

## 2016-01-02 DIAGNOSIS — M47816 Spondylosis without myelopathy or radiculopathy, lumbar region: Secondary | ICD-10-CM

## 2016-01-02 MED ORDER — HYDROCODONE-ACETAMINOPHEN 10-325 MG PO TABS: ORAL_TABLET | ORAL | 0 refills | Status: DC

## 2016-01-02 NOTE — Progress Notes (Signed)
   The patient is a 40 year old female who returns to pain management for further evaluation and treatment of pain involving the lower back and lower extremity region predominantly. The patient states that she does have significant lower back pain. Lower back pain radiates to the buttocks and lower extremities. The patient is without trauma change in events of daily living the call significant change in symptomatology. The patient is undergone lumbar epidural steroid injection as well as lumbosacral selective nerve root block. Following review of patient's studies of the lumbar spine and evaluation of patient decision has been made to schedule patient for lumbar facet, medial branch nerve, blocks at time return appointment. We will continue patient's present medication the patient is a understanding and in agreement with suggested treatment plan.    Physical examination  There was tenderness to palpation of the splenius capitis and occipitalis musculature region a mild to moderate degree with no masses of the hip negative noted. There was tenderness of the acromioclavicular and glenohumeral joint regions of mild degree. The patient appeared to be with unremarkable Spurling's maneuver and appeared to be with bilaterally equal grip strength without significant increase of pain with Tinel and Phalen's maneuver. Palpation of the thoracic region was with tenderness to palpation with no crepitus of the thoracic region noted. There was evidence of muscle spasms involving the thoracic paraspinal musculature region. Palpation over the lumbar paraspinal musculature region lumbar facet region was with moderate increase of pain with lateral bending rotation extension and palpation over the lumbar facet region. Palpation over the PSIS and PII S regions reproduce moderate discomfort as well. There was no severe increased pain with palpation over the greater trochanteric region iliotibial band region. Straight leg raising  appeared to be tolerates approximately 30 without increase of pain with dorsiflexion noted. EHL strength appeared to be decreased. There was negative clonus negative Homans. No definite sensory deficit of dermatomal distribution detected. Palpation over the lumbar facet region reproduced predominant portion of patient's pain.     Assessment   Degenerative disc disease lumbar spine IMPRESSION: LUMBAR MYELOGRAM IMPRESSION:  No nerve root cut off or spinal stenosis. No dynamic instability. Disc space narrowing L5-S1.  CT LUMBAR MYELOGRAM IMPRESSION:  Central and rightward protrusion L5-S1. Mild BILATERAL foraminal narrowing. No neural impingement is demonstrated.  LumbIMPRESSION: L4-5: Disc degeneration with bulging of the disc and mild facet hypertrophy but no stenosis or neural compression.  L5-S1: Disc herniation more prominent centrally and to the right, indenting the thecal sac and abutting the S1 root sleeves. Neural compression is not demonstrated but this could be associated with back pain or neural irritation.ar MRI  Lumbar facet syndrome  Lumbar radiculopathy  Sacroiliac joint dysfunction  Cervical facet syndrome     PLAN  Continue present medication hydrocodone acetaminophen  Lumbar facet, medial branch nerve, blocks to be performed at time of return appointment  F/U PCP R Chauvin for evaliation of  BP and general medical  condition  F/U surgical evaluation. Patient to follow-up with Dr. Arnoldo Morale for neurosurgical reevaluation as needed. Dr. Arnoldo Morale has recommended patient continue treatment in pain management  F/U neurological evaluation. . We will proceed with the [facility] EMG studies pending follow-up evaluations as discussed  May consider radiofrequency rhizolysis or intraspinal procedures pending response to present treatment and F/U evaluation   Patient to call [facility] should patient have concerns prior to scheduled return  appointment

## 2016-01-02 NOTE — Progress Notes (Signed)
Safety precautions to be maintained throughout the outpatient stay will include: orient to surroundings, keep bed in low position, maintain call bell within reach at all times, provide assistance with transfer out of bed and ambulation.  

## 2016-01-02 NOTE — Patient Instructions (Addendum)
PLAN  Continue present medication hydrocodone acetaminophen  Lumbar facet, medial branch nerve, blocks to be performed at time of return appointment  F/U PCP R Chauvin for evaliation of  BP and general medical  condition  F/U surgical evaluation. Patient to follow-up with Dr. Arnoldo Morale for neurosurgical reevaluation as needed. Dr. Arnoldo Morale has recommended patient continue treatment in pain management  F/U neurological evaluation. . We will proceed with the [facility] EMG studies pending follow-up evaluations as discussed  May consider radiofrequency rhizolysis or intraspinal procedures pending response to present treatment and F/U evaluation   Patient to call Pain Management Center should patient have concerns prior to scheduled return appointment.Facet Joint Block The facet joints connect the bones of the spine (vertebrae). They make it possible for you to bend, twist, and make other movements with your spine. They also prevent you from overbending, overtwisting, and making other excessive movements.  A facet joint block is a procedure where a numbing medicine (anesthetic) is injected into a facet joint. Often, a type of anti-inflammatory medicine called a steroid is also injected. A facet joint block may be done for two reasons:   Diagnosis. A facet joint block may be done as a test to see whether neck or back pain is caused by a worn-down or infected facet joint. If the pain gets better after a facet joint block, it means the pain is probably coming from the facet joint. If the pain does not get better, it means the pain is probably not coming from the facet joint.   Therapy. A facet joint block may be done to relieve neck or back pain caused by a facet joint. A facet joint block is only done as a therapy if the pain does not improve with medicine, exercise programs, physical therapy, and other forms of pain management. LET [facility] CARE PROVIDER KNOW ABOUT:   Any allergies you have.   All  medicines you are taking, including vitamins, herbs, eyedrops, and over-the-counter medicines and creams.   Previous problems you or members of your family have had with the use of anesthetics.   Any blood disorders you have had.   Other health problems you have. RISKS AND COMPLICATIONS Generally, having a facet joint block is safe. However, as with any procedure, complications can occur. Possible complications associated with having a facet joint block include:   Bleeding.   Injury to a nerve near the injection site.   Pain at the injection site.   Weakness or numbness in areas controlled by nerves near the injection site.   Infection.   Temporary fluid retention.   Allergic reaction to anesthetics or medicines used during the procedure. BEFORE THE PROCEDURE   Follow your health care provider's instructions if you are taking dietary supplements or medicines. You may need to stop taking them or reduce your dosage.   Do not take any new dietary supplements or medicines without asking your health care provider first.   Follow your health care provider's instructions about eating and drinking before the procedure. You may need to stop eating and drinking several hours before the procedure.   Arrange to have an adult drive you home after the procedure. PROCEDURE  You may need to remove your clothing and dress in an open-back gown so that your health care provider can access your spine.   The procedure will be done while you are lying on an X-ray table. Most of the time you will be asked to lie on your stomach, but  you may be asked to lie in a different position if an injection will be made in your neck.   Special machines will be used to monitor your oxygen levels, heart rate, and blood pressure.   If an injection will be made in your neck, an intravenous (IV) tube will be inserted into one of your veins. Fluids and medicine will flow directly into your body through  the IV tube.   The area over the facet joint where the injection will be made will be cleaned with an antiseptic soap. The surrounding skin will be covered with sterile drapes.   An anesthetic will be applied to your skin to make the injection area numb. You may feel a temporary stinging or burning sensation.   A video X-ray machine will be used to locate the joint. A contrast dye may be injected into the facet joint area to help with locating the joint.   When the joint is located, an anesthetic medicine will be injected into the joint through the needle.   Your health care provider will ask you whether you feel pain relief. If you do feel relief, a steroid may be injected to provide pain relief for a longer period of time. If you do not feel relief or feel only partial relief, additional injections of an anesthetic may be made in other facet joints.   The needle will be removed, the skin will be cleansed, and bandages will be applied.  AFTER THE PROCEDURE   You will be observed for 15-30 minutes before being allowed to go home. Do not drive. Have an adult drive you or take a taxi or public transportation instead.   If you feel pain relief, the pain will return in several hours or days when the anesthetic wears off.   You may feel pain relief 2-14 days after the procedure. The amount of time this relief lasts varies from person to person.   It is normal to feel some tenderness over the injected area(s) for 2 days following the procedure.   If you have diabetes, you may have a temporary increase in blood sugar.   This information is not intended to replace advice given to you by your health care provider. Make sure you discuss any questions you have with your health care provider.   Document Released: 10/09/2006 Document Revised: 06/10/2014 Document Reviewed: 03/09/2012 Elsevier Interactive Patient Education 2016 [place]  What are the  risk, side effects and possible complications? Generally speaking, most procedures are safe.  However, with any procedure there are risks, side effects, and the possibility of complications.  The risks and complications are dependent upon the sites that are lesioned, or the type of nerve block to be performed.  The closer the procedure is to the spine, the more serious the risks are.  Great care is taken when placing the radio frequency needles, block needles or lesioning probes, but sometimes complications can occur. 1. Infection: Any time there is an injection through the skin, there is a risk of infection.  This is why sterile conditions are used for these blocks.  There are four possible types of infection. 1. Localized skin infection. 2. Central Nervous System Infection-This can be in the form of Meningitis, which can be deadly. 3. Epidural Infections-This can be in the form of an epidural abscess, which can cause pressure inside of the spine, causing compression of the spinal cord with subsequent paralysis. This would require an emergency surgery to  decompress, and there are no guarantees that the patient would recover from the paralysis. 4. Discitis-This is an infection of the intervertebral discs.  It occurs in about 1% of discography procedures.  It is difficult to treat and it may lead to surgery.        2. Pain: the needles have to go through skin and soft tissues, will cause soreness.       3. Damage to internal structures:  The nerves to be lesioned may be near blood vessels or    other nerves which can be potentially damaged.       4. Bleeding: Bleeding is more common if the patient is taking blood thinners such as  aspirin, Coumadin, Ticiid, Plavix, etc., or if he/she have some genetic predisposition  such as hemophilia. Bleeding into the spinal canal can cause compression of the spinal  cord with subsequent paralysis.  This would require an emergency surgery to  decompress and there are no  guarantees that the patient would recover from the  paralysis.       5. Pneumothorax:  Puncturing of a lung is a possibility, every time a needle is introduced in  the area of the chest or upper back.  Pneumothorax refers to free air around the  collapsed lung(s), inside of the thoracic cavity (chest cavity).  Another two possible  complications related to a similar event would include: Hemothorax and Chylothorax.   These are variations of the Pneumothorax, where instead of air around the collapsed  lung(s), you may have blood or chyle, respectively.       6. Spinal headaches: They may occur with any procedures in the area of the spine.       7. Persistent CSF (Cerebro-Spinal Fluid) leakage: This is a rare problem, but may occur  with prolonged intrathecal or epidural catheters either due to the formation of a fistulous  track or a dural tear.       8. Nerve damage: By working so close to the spinal cord, there is always a possibility of  nerve damage, which could be as serious as a permanent spinal cord injury with  paralysis.       9. Death:  Although rare, severe deadly allergic reactions known as "Anaphylactic  reaction" can occur to any of the medications used.      10. Worsening of the symptoms:  We can always make thing worse.  What are the chances of something like this happening? Chances of any of this occuring are extremely low.  By statistics, you have more of a chance of getting killed in a motor vehicle accident: while driving to the hospital than any of the above occurring .  Nevertheless, you should be aware that they are possibilities.  In general, it is similar to taking a shower.  Everybody knows that you can slip, hit your head and get killed.  Does that mean that you should not shower again?  Nevertheless always keep in mind that statistics do not mean anything if you happen to be on the wrong side of them.  Even if a procedure has a 1 (one) in a 1,000,000 (million) chance of going  wrong, it you happen to be that one..Also, keep in mind that by statistics, you have more of a chance of having something go wrong when taking medications.  Who should not have this procedure? If you are on a blood thinning medication (e.g. Coumadin, Plavix, see list of "Blood Thinners"), or if you  have an active infection going on, you should not have the procedure.  If you are taking any blood thinners, please inform your physician.  How should I prepare for this procedure?  Do not eat or drink anything at least six hours prior to the procedure.  Bring a driver with you .  It cannot be a taxi.  Come accompanied by an adult that can drive you back, and that is strong enough to help you if your legs get weak or numb from the local anesthetic.  Take all of your medicines the morning of the procedure with just enough water to swallow them.  If you have diabetes, make sure that you are scheduled to have your procedure done first thing in the morning, whenever possible.  If you have diabetes, take only half of your insulin dose and notify our nurse that you have done so as soon as you arrive at the clinic.  If you are diabetic, but only take blood sugar pills (oral hypoglycemic), then do not take them on the morning of your procedure.  You may take them after you have had the procedure.  Do not take aspirin or any aspirin-containing medications, at least eleven (11) days prior to the procedure.  They may prolong bleeding.  Wear loose fitting clothing that may be easy to take off and that you would not mind if it got stained with Betadine or blood.  Do not wear any jewelry or perfume  Remove any nail coloring.  It will interfere with some of our monitoring equipment.  NOTE: Remember that this is not meant to be interpreted as a complete list of all possible complications.  Unforeseen problems may occur.  BLOOD THINNERS The following drugs contain aspirin or other products, which can cause  increased bleeding during surgery and should not be taken for 2 weeks prior to and 1 week after surgery.  If you should need take something for relief of minor pain, you may take acetaminophen which is found in Tylenol,m Datril, Anacin-3 and Panadol. It is not blood thinner. The products listed below are.  Do not take any of the products listed below in addition to any listed on your instruction sheet.  A.P.C or A.P.C with Codeine Codeine Phosphate Capsules #3 Ibuprofen Ridaura  ABC compound Congesprin Imuran rimadil  Advil Cope Indocin Robaxisal  Alka-Seltzer Effervescent Pain Reliever and Antacid Coricidin or Coricidin-D  Indomethacin Rufen  Alka-Seltzer plus Cold Medicine Cosprin Ketoprofen S-A-C Tablets  Anacin Analgesic Tablets or Capsules Coumadin Korlgesic Salflex  Anacin Extra Strength Analgesic tablets or capsules CP-2 Tablets Lanoril Salicylate  Anaprox Cuprimine Capsules Levenox Salocol  Anexsia-D Dalteparin Magan Salsalate  Anodynos Darvon compound Magnesium Salicylate Sine-off  Ansaid Dasin Capsules Magsal Sodium Salicylate  Anturane Depen Capsules Marnal Soma  APF Arthritis pain formula Dewitt's Pills Measurin Stanback  Argesic Dia-Gesic Meclofenamic Sulfinpyrazone  Arthritis Bayer Timed Release Aspirin Diclofenac Meclomen Sulindac  Arthritis pain formula Anacin Dicumarol Medipren Supac  Analgesic (Safety coated) Arthralgen Diffunasal Mefanamic Suprofen  Arthritis Strength Bufferin Dihydrocodeine Mepro Compound Suprol  Arthropan liquid Dopirydamole Methcarbomol with Aspirin Synalgos  ASA tablets/Enseals Disalcid Micrainin Tagament  Ascriptin Doan's Midol Talwin  Ascriptin A/D Dolene Mobidin Tanderil  Ascriptin Extra Strength Dolobid Moblgesic Ticlid  Ascriptin with Codeine Doloprin or Doloprin with Codeine Momentum Tolectin  Asperbuf Duoprin Mono-gesic Trendar  Aspergum Duradyne Motrin or Motrin IB Triminicin  Aspirin plain, buffered or enteric coated Durasal Myochrisine  Trigesic  Aspirin Suppositories Easprin Nalfon Trillsate  Aspirin with Codeine  Ecotrin Regular or Extra Strength Naprosyn Uracel  Atromid-S Efficin Naproxen Ursinus  Auranofin Capsules Elmiron Neocylate Vanquish  Axotal Emagrin Norgesic Verin  Azathioprine Empirin or Empirin with Codeine Normiflo Vitamin E  Azolid Emprazil Nuprin Voltaren  Bayer Aspirin plain, buffered or children's or timed BC Tablets or powders Encaprin Orgaran Warfarin Sodium  Buff-a-Comp Enoxaparin Orudis Zorpin  Buff-a-Comp with Codeine Equegesic Os-Cal-Gesic   Buffaprin Excedrin plain, buffered or Extra Strength Oxalid   Bufferin Arthritis Strength Feldene Oxphenbutazone   Bufferin plain or Extra Strength Feldene Capsules Oxycodone with Aspirin   Bufferin with Codeine Fenoprofen Fenoprofen Pabalate or Pabalate-SF   Buffets II Flogesic Panagesic   Buffinol plain or Extra Strength Florinal or Florinal with Codeine Panwarfarin   Buf-Tabs Flurbiprofen Penicillamine   Butalbital Compound Four-way cold tablets Penicillin   Butazolidin Fragmin Pepto-Bismol   Carbenicillin Geminisyn Percodan   Carna Arthritis Reliever Geopen Persantine   Carprofen Gold's salt Persistin   Chloramphenicol Goody's Phenylbutazone   Chloromycetin Haltrain Piroxlcam   Clmetidine heparin Plaquenil   Cllnoril Hyco-pap Ponstel   Clofibrate Hydroxy chloroquine Propoxyphen         Before stopping any of these medications, be sure to consult the physician who ordered them.  Some, such as Coumadin (Warfarin) are ordered to prevent or treat serious conditions such as "deep thrombosis", "pumonary embolisms", and other heart problems.  The amount of time that you may need off of the medication may also vary with the medication and the reason for which you were taking it.  If you are taking any of these medications, please make sure you notify your pain physician before you undergo any procedures.         Hydrocodone acetaminophen at this time as  well.

## 2016-01-03 NOTE — Telephone Encounter (Signed)
Spoke with patient and she has an appt. With gyn tomorrow and will follow up with [facility] as planned on Monday.

## 2016-01-04 DIAGNOSIS — R102 Pelvic and perineal pain: Secondary | ICD-10-CM | POA: Diagnosis not present

## 2016-01-04 DIAGNOSIS — N83201 Unspecified ovarian cyst, right side: Secondary | ICD-10-CM | POA: Diagnosis not present

## 2016-01-04 DIAGNOSIS — G8929 Other chronic pain: Secondary | ICD-10-CM | POA: Diagnosis not present

## 2016-01-08 ENCOUNTER — Ambulatory Visit: Payer: 59 | Attending: Pain Medicine | Admitting: Pain Medicine

## 2016-01-08 ENCOUNTER — Ambulatory Visit (INDEPENDENT_AMBULATORY_CARE_PROVIDER_SITE_OTHER): Payer: 59 | Admitting: Urology

## 2016-01-08 ENCOUNTER — Encounter: Payer: Self-pay | Admitting: Pain Medicine

## 2016-01-08 ENCOUNTER — Encounter: Payer: Self-pay | Admitting: Urology

## 2016-01-08 VITALS — BP 112/74 | HR 64 | Ht 69.0 in | Wt 219.9 lb

## 2016-01-08 VITALS — BP 122/90 | HR 72 | Temp 97.3°F | Resp 16 | Ht 69.0 in | Wt 218.0 lb

## 2016-01-08 DIAGNOSIS — M545 Low back pain: Secondary | ICD-10-CM | POA: Diagnosis not present

## 2016-01-08 DIAGNOSIS — N941 Unspecified dyspareunia: Secondary | ICD-10-CM | POA: Diagnosis not present

## 2016-01-08 DIAGNOSIS — M5136 Other intervertebral disc degeneration, lumbar region: Secondary | ICD-10-CM | POA: Diagnosis not present

## 2016-01-08 DIAGNOSIS — IMO0001 Reserved for inherently not codable concepts without codable children: Secondary | ICD-10-CM

## 2016-01-08 DIAGNOSIS — N8110 Cystocele, unspecified: Secondary | ICD-10-CM

## 2016-01-08 DIAGNOSIS — M5126 Other intervertebral disc displacement, lumbar region: Secondary | ICD-10-CM | POA: Insufficient documentation

## 2016-01-08 DIAGNOSIS — IMO0002 Reserved for concepts with insufficient information to code with codable children: Principal | ICD-10-CM

## 2016-01-08 DIAGNOSIS — R32 Unspecified urinary incontinence: Secondary | ICD-10-CM | POA: Diagnosis not present

## 2016-01-08 DIAGNOSIS — G8929 Other chronic pain: Secondary | ICD-10-CM

## 2016-01-08 DIAGNOSIS — M79606 Pain in leg, unspecified: Secondary | ICD-10-CM | POA: Diagnosis present

## 2016-01-08 DIAGNOSIS — M47816 Spondylosis without myelopathy or radiculopathy, lumbar region: Secondary | ICD-10-CM

## 2016-01-08 DIAGNOSIS — M549 Dorsalgia, unspecified: Secondary | ICD-10-CM

## 2016-01-08 MED ORDER — FENTANYL CITRATE (PF) 100 MCG/2ML IJ SOLN
100.0000 ug | Freq: Once | INTRAMUSCULAR | Status: AC
  Administered 2016-01-08: 100 ug via INTRAVENOUS
  Filled 2016-01-08: qty 2

## 2016-01-08 MED ORDER — BUPIVACAINE HCL (PF) 0.25 % IJ SOLN
30.0000 mL | Freq: Once | INTRAMUSCULAR | Status: AC
  Administered 2016-01-08: 30 mL

## 2016-01-08 MED ORDER — BUPIVACAINE-EPINEPHRINE (PF) 0.25% -1:200000 IJ SOLN
INTRAMUSCULAR | Status: AC
  Administered 2016-01-08: 15:00:00
  Filled 2016-01-08: qty 30

## 2016-01-08 MED ORDER — MIDAZOLAM HCL 5 MG/5ML IJ SOLN
5.0000 mg | Freq: Once | INTRAMUSCULAR | Status: AC
  Administered 2016-01-08: 5 mg via INTRAVENOUS
  Filled 2016-01-08: qty 5

## 2016-01-08 MED ORDER — ORPHENADRINE CITRATE 30 MG/ML IJ SOLN
60.0000 mg | Freq: Once | INTRAMUSCULAR | Status: AC
  Administered 2016-01-08: 60 mg via INTRAMUSCULAR
  Filled 2016-01-08: qty 2

## 2016-01-08 MED ORDER — TRIAMCINOLONE ACETONIDE 40 MG/ML IJ SUSP
40.0000 mg | Freq: Once | INTRAMUSCULAR | Status: AC
  Administered 2016-01-08: 40 mg
  Filled 2016-01-08: qty 1

## 2016-01-08 MED ORDER — LACTATED RINGERS IV SOLN: 1000.0000 mL | INTRAVENOUS | Status: DC

## 2016-01-08 NOTE — Progress Notes (Signed)
Patient here today for procedure d/t lower back pain.  Safety precautions to be maintained throughout the outpatient stay will include: orient to surroundings, keep bed in low position, maintain call bell within reach at all times, provide assistance with transfer out of bed and ambulation.

## 2016-01-08 NOTE — Progress Notes (Signed)
01/08/2016 4:27 PM   Misty Zamora [birth year] [id number]  Referring provider: Carmon Ginsberg, [place] [address] [place], [place] [address]  Chief Complaint  Patient presents with  . Follow-up    vaginal bulge- 1 week    HPI: Patient is a 40 year old female who presents today for a vaginal bulge that has been occurring for one week.  She has been experiencing a pelvic pressure and lower abdominal pain for several days.  Then last week, she felt a bulge in her vagina.  She has been having associated bladder spasms, urinary frequency, nocturia and incomplete emptying.    She is having SUI and UI.  She is wearing pads.    She is also experiencing vaginal bleeding after intercourse and pain with intercourse.  She states this has been occurring for one year.  She feels a "sticking" sensation in her abdomen during thrusting.    She denies any gross hematuria, dysuria and suprapubic pain.  She also denies any fevers, chills and/or vomiting.  She has been having nausea for several weeks.  PCP feels it may be due to narcotic withdrawal.    PMH: Past Medical History:  Diagnosis Date  . Anemia    had iron infusions. last one in Sept. told levels are now normal  . Anxiety   . 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 Bulging lumbar disc    causes bilateral leg pain  . Depression   . GERD (gastroesophageal reflux disease)    occasional  . Heart murmur 2005  . History of cardiovascular stress test 2005   showed MR and TR  . History of kidney stones   . Hypertension    no meds since gastric bypass  . IDA (iron deficiency anemia)   . PONV (postoperative nausea and vomiting)    hypotension with epidural  . Sleep apnea    was better after gastric bypass, hasd recently started snoring again.  . Wears contact lenses     Surgical History: Past Surgical History:  Procedure Laterality Date  . ABDOMINAL HYSTERECTOMY  12/2014   ovaries in situ  . BILATERAL  SALPINGECTOMY Bilateral 12/27/2014   Procedure: BILATERAL SALPINGECTOMY;  Surgeon: Will Bonnet, MD;  Location: [facility];  Service: Gynecology;  Laterality: Bilateral;  . COLONOSCOPY  2010  . CYSTOSCOPY N/A 12/27/2014   Procedure: CYSTOSCOPY;  Surgeon: Will Bonnet, MD;  Location: [facility];  Service: Gynecology;  Laterality: N/A;  . DILATION AND CURETTAGE OF UTERUS    . ELBOW ARTHROSCOPY Left 03/30/2015   Procedure: ARTHROSCOPY ELBOW WITH DEBRIDEMENT OF THE SYMPTOMATIC PLICA;  Surgeon: Corky Mull, MD;  Location: [facility];  Service: Orthopedics;  Laterality: Left;  . ELBOW SURGERY Left   . ERCP    . gallbladder removed    . GASTRIC BYPASS  2012  . LAPAROSCOPIC HYSTERECTOMY N/A 12/27/2014   Procedure: HYSTERECTOMY TOTAL LAPAROSCOPIC;  Surgeon: Will Bonnet, MD;  Location: [facility];  Service: Gynecology;  Laterality: N/A;  . laproscopy    . RENAL ENDOSCOPY VIA NEPHROSTOMY / PYELOSTOMY  2010  . TUBAL LIGATION      Home Medications:    Medication List       Accurate as of 01/08/16  4:27 PM. Always use your most recent med list.          clonazePAM 0.5 MG tablet Commonly known as:  KLONOPIN TAKE 1 TABLET BY MOUTH TWICE A DAY AS NEEDED FOR  ANXIETY   DULoxetine 60 MG capsule Commonly known as:  CYMBALTA Take one daily   furosemide 20 MG tablet Commonly known as:  LASIX Take 1 tablet (20 mg total) by mouth daily as needed for edema.   HYDROcodone-acetaminophen 10-325 MG tablet Commonly known as:  NORCO Limit 1 tablet by mouth 3-6 times per day if tolerated   metoCLOPramide 10 MG tablet Commonly known as:  REGLAN Take 1 tablet (10 mg total) by mouth every 6 (six) hours.   omeprazole 20 MG capsule Commonly known as:  PRILOSEC Take 20 mg by mouth daily.   promethazine 25 MG tablet Commonly known as:  PHENERGAN Take 1 tablet (25 mg total) by mouth every 6 (six) hours as needed for nausea or vomiting.   propranolol 10 MG tablet Commonly known as:   INDERAL Take 1 tablet (10 mg total) by mouth 3 (three) times daily as needed.       Allergies:  Allergies  Allergen Reactions  . Compazine [Prochlorperazine Edisylate] Other (See Comments)    Patient states it made her feel" very jittery and nervous"  . Ketorolac Palpitations    Family History: Family History  Problem Relation Age of Onset  . Hypertension Mother   . Hyperlipidemia Mother   . Diabetes Mother   . Cancer Mother     breast cancer; 04/2011  . Colon polyps Mother   . Breast cancer Mother 50  . Hyperlipidemia Father   . Hypertension Father   . Colon polyps Father   . Hypertension Sister   . Hypertension Brother   . Cancer Maternal Uncle     terminal lung cancer  . Breast cancer Maternal Aunt   . Breast cancer Paternal Aunt   . Breast cancer Paternal Grandmother   . Bladder Cancer Neg Hx   . Kidney cancer Neg Hx     Social History:  reports that she quit smoking about 16 years ago. Her smoking use included Cigarettes. She smoked 0.00 packs per day for 8.00 years. She has never used smokeless tobacco. She reports that she does not drink alcohol or use drugs.  ROS: UROLOGY Frequent Urination?: Yes Hard to postpone urination?: No Burning/pain with urination?: No Get up at night to urinate?: Yes Leakage of urine?: Yes Urine stream starts and stops?: No Trouble starting stream?: No Do you have to strain to urinate?: Yes Blood in urine?: No Urinary tract infection?: No Sexually transmitted disease?: No Injury to kidneys or bladder?: No Painful intercourse?: Yes Weak stream?: No Currently pregnant?: No Vaginal bleeding?: No Last menstrual period?: n  Gastrointestinal Nausea?: Yes Vomiting?: No Indigestion/heartburn?: Yes Diarrhea?: No Constipation?: No  Constitutional Fever: No Night sweats?: No Weight loss?: No Fatigue?: No  Skin Skin rash/lesions?: No Itching?: No  Eyes Blurred vision?: No Double vision?: No  Ears/Nose/Throat Sore  throat?: No Sinus problems?: No  Hematologic/Lymphatic Swollen glands?: No Easy bruising?: No  Cardiovascular Leg swelling?: No Chest pain?: Yes  Respiratory Cough?: No Shortness of breath?: No  Endocrine Excessive thirst?: No  Musculoskeletal Back pain?: Yes Joint pain?: No  Neurological Headaches?: No Dizziness?: No  Psychologic Depression?: No Anxiety?: Yes  Physical Exam: BP 112/74 (BP Location: Left Arm, Patient Position: Sitting, Cuff Size: Large)   Pulse 64   Ht 5\' 9"  (1.753 m)   Wt 219 lb 14.4 oz (99.7 kg)   LMP 12/14/2014 (Exact Date)   BMI 32.47 kg/m   Constitutional: Well nourished. Alert and oriented, No acute distress. HEENT: [place] AT, moist mucus  membranes. Trachea midline, no masses. Cardiovascular: No clubbing, cyanosis, or edema. Respiratory: Normal respiratory effort, no increased work of breathing. GI: Abdomen is soft, non tender, non distended, no abdominal masses. Liver and spleen not palpable.  No hernias appreciated.  Stool sample for occult testing is not indicated.   GU: No CVA tenderness.  No bladder fullness or masses.  Normal external genitalia, normal pubic hair distribution, no lesions.  Normal urethral meatus, no lesions, no prolapse, no discharge.   No urethral masses, tenderness and/or tenderness. No bladder fullness, tenderness or masses. Normal vagina mucosa, good estrogen effect, no discharge, no lesions, good pelvic support, Grade II cystocele is noted.  No rectocele is noted.  Cervix and uterus are surgically absent.  No adnexal/parametria masses or tenderness noted.  Anus and perineum are without rashes or lesions.    Skin: No rashes, bruises or suspicious lesions. Lymph: No cervical or inguinal adenopathy. Neurologic: Grossly intact, no focal deficits, moving all 4 extremities. Psychiatric: Normal mood and affect.  Laboratory Data: Lab Results  Component Value Date   WBC 8.3 12/24/2015   HGB 14.7 12/24/2015   HCT 42.4  12/24/2015   MCV 87.8 12/24/2015   PLT 233 12/24/2015    Lab Results  Component Value Date   CREATININE 0.76 12/24/2015    Lab Results  Component Value Date   HGBA1C 5.1 07/05/2015    Lab Results  Component Value Date   TSH 1.239 07/05/2015       Component Value Date/Time   CHOL 114 01/23/2012 0545   HDL 39 (L) 01/23/2012 0545   VLDL 10 01/23/2012 0545   LDLCALC 65 01/23/2012 0545    Lab Results  Component Value Date   AST 16 12/24/2015   Lab Results  Component Value Date   ALT 14 12/24/2015    Assessment & Plan:    1. Cystocele  - discussed with the patient the options of managing conservatively, a pessary fitting, physical therapy or surgical correction  - she would like to see one of our surgeons for further discussion regarding her cystocele  2. Dyspareunia  - cystocele may be contributing to the pain during thrusting  3. Incontinence  - SUI may be corrected with a surgical procedure  - UI may worsen, further discussion at next visit   Return for appt. with Dr. Louis Meckel or Dr. Matilde Sprang for cystocele .  These notes generated with voice recognition software. I apologize for typographical errors.  Zara Council, [facility] [address], [place], [place] [address] [phone]

## 2016-01-08 NOTE — Patient Instructions (Addendum)
PLAN  Continue present medication hydrocodone acetaminophen  F/U PCP R Chauvin for evaliation of  BP and general medical  condition  F/U surgical evaluation. Patient to follow-up with Dr. Arnoldo Morale for neurosurgical reevaluation as needed. Dr. Arnoldo Morale has recommended patient continue treatment in pain management  F/U neurological evaluation. . We will proceed with the [facility] EMG studies pending follow-up evaluations as discussed  May consider radiofrequency rhizolysis or intraspinal procedures pending response to present treatment and F/U evaluation   Patient to call Pain Management Center should patient have concerns prior to scheduled return appointmentPain Management Discharge Instructions  General Discharge Instructions :  If you need to reach your doctor call: Monday-Friday 8:00 am - 4:00 pm at [phone] or toll free [phone].  After clinic hours [phone] to have operator reach doctor.  Bring all of your medication bottles to all your appointments in the pain clinic.  To cancel or reschedule your appointment with Pain Management please remember to call 24 hours in advance to avoid a fee.  Refer to the educational materials which you have been given on: General Risks, I had my Procedure. Discharge Instructions, Post Sedation.  Post Procedure Instructions:  The drugs you were given will stay in your system until tomorrow, so for the next 24 hours you should not drive, make any legal decisions or drink any alcoholic beverages.  You may eat anything you prefer, but it is better to start with liquids then soups and crackers, and gradually work up to solid foods.  Please notify your doctor immediately if you have any unusual bleeding, trouble breathing or pain that is not related to your normal pain.  Depending on the type of procedure that was done, some parts of your body may feel week and/or numb.  This usually clears up by tonight or the next day.  Walk with the use of  an assistive device or accompanied by an adult for the 24 hours.  You may use ice on the affected area for the first 24 hours.  Put ice in a Ziploc bag and cover with a towel and place against area 15 minutes on 15 minutes off.  You may switch to heat after 24 hours.GENERAL RISKS AND COMPLICATIONS  What are the risk, side effects and possible complications? Generally speaking, most procedures are safe.  However, with any procedure there are risks, side effects, and the possibility of complications.  The risks and complications are dependent upon the sites that are lesioned, or the type of nerve block to be performed.  The closer the procedure is to the spine, the more serious the risks are.  Great care is taken when placing the radio frequency needles, block needles or lesioning probes, but sometimes complications can occur. 1. Infection: Any time there is an injection through the skin, there is a risk of infection.  This is why sterile conditions are used for these blocks.  There are four possible types of infection. 1. Localized skin infection. 2. Central Nervous System Infection-This can be in the form of Meningitis, which can be deadly. 3. Epidural Infections-This can be in the form of an epidural abscess, which can cause pressure inside of the spine, causing compression of the spinal cord with subsequent paralysis. This would require an emergency surgery to decompress, and there are no guarantees that the patient would recover from the paralysis. 4. Discitis-This is an infection of the intervertebral discs.  It occurs in about 1% of discography procedures.  It is difficult to treat and  it may lead to surgery.        2. Pain: the needles have to go through skin and soft tissues, will cause soreness.       3. Damage to internal structures:  The nerves to be lesioned may be near blood vessels or    other nerves which can be potentially damaged.       4. Bleeding: Bleeding is more common if the  patient is taking blood thinners such as  aspirin, Coumadin, Ticiid, Plavix, etc., or if he/she have some genetic predisposition  such as hemophilia. Bleeding into the spinal canal can cause compression of the spinal  cord with subsequent paralysis.  This would require an emergency surgery to  decompress and there are no guarantees that the patient would recover from the  paralysis.       5. Pneumothorax:  Puncturing of a lung is a possibility, every time a needle is introduced in  the area of the chest or upper back.  Pneumothorax refers to free air around the  collapsed lung(s), inside of the thoracic cavity (chest cavity).  Another two possible  complications related to a similar event would include: Hemothorax and Chylothorax.   These are variations of the Pneumothorax, where instead of air around the collapsed  lung(s), you may have blood or chyle, respectively.       6. Spinal headaches: They may occur with any procedures in the area of the spine.       7. Persistent CSF (Cerebro-Spinal Fluid) leakage: This is a rare problem, but may occur  with prolonged intrathecal or epidural catheters either due to the formation of a fistulous  track or a dural tear.       8. Nerve damage: By working so close to the spinal cord, there is always a possibility of  nerve damage, which could be as serious as a permanent spinal cord injury with  paralysis.       9. Death:  Although rare, severe deadly allergic reactions known as "Anaphylactic  reaction" can occur to any of the medications used.      10. Worsening of the symptoms:  We can always make thing worse.  What are the chances of something like this happening? Chances of any of this occuring are extremely low.  By statistics, you have more of a chance of getting killed in a motor vehicle accident: while driving to the hospital than any of the above occurring .  Nevertheless, you should be aware that they are possibilities.  In general, it is similar to taking a  shower.  Everybody knows that you can slip, hit your head and get killed.  Does that mean that you should not shower again?  Nevertheless always keep in mind that statistics do not mean anything if you happen to be on the wrong side of them.  Even if a procedure has a 1 (one) in a 1,000,000 (million) chance of going wrong, it you happen to be that one..Also, keep in mind that by statistics, you have more of a chance of having something go wrong when taking medications.  Who should not have this procedure? If you are on a blood thinning medication (e.g. Coumadin, Plavix, see list of "Blood Thinners"), or if you have an active infection going on, you should not have the procedure.  If you are taking any blood thinners, please inform your physician.  How should I prepare for this procedure?  Do not eat or drink anything  at least six hours prior to the procedure.  Bring a driver with you .  It cannot be a taxi.  Come accompanied by an adult that can drive you back, and that is strong enough to help you if your legs get weak or numb from the local anesthetic.  Take all of your medicines the morning of the procedure with just enough water to swallow them.  If you have diabetes, make sure that you are scheduled to have your procedure done first thing in the morning, whenever possible.  If you have diabetes, take only half of your insulin dose and notify our nurse that you have done so as soon as you arrive at the clinic.  If you are diabetic, but only take blood sugar pills (oral hypoglycemic), then do not take them on the morning of your procedure.  You may take them after you have had the procedure.  Do not take aspirin or any aspirin-containing medications, at least eleven (11) days prior to the procedure.  They may prolong bleeding.  Wear loose fitting clothing that may be easy to take off and that you would not mind if it got stained with Betadine or blood.  Do not wear any jewelry or  perfume  Remove any nail coloring.  It will interfere with some of our monitoring equipment.  NOTE: Remember that this is not meant to be interpreted as a complete list of all possible complications.  Unforeseen problems may occur.  BLOOD THINNERS The following drugs contain aspirin or other products, which can cause increased bleeding during surgery and should not be taken for 2 weeks prior to and 1 week after surgery.  If you should need take something for relief of minor pain, you may take acetaminophen which is found in Tylenol,m Datril, Anacin-3 and Panadol. It is not blood thinner. The products listed below are.  Do not take any of the products listed below in addition to any listed on your instruction sheet.  A.P.C or A.P.C with Codeine Codeine Phosphate Capsules #3 Ibuprofen Ridaura  ABC compound Congesprin Imuran rimadil  Advil Cope Indocin Robaxisal  Alka-Seltzer Effervescent Pain Reliever and Antacid Coricidin or Coricidin-D  Indomethacin Rufen  Alka-Seltzer plus Cold Medicine Cosprin Ketoprofen S-A-C Tablets  Anacin Analgesic Tablets or Capsules Coumadin Korlgesic Salflex  Anacin Extra Strength Analgesic tablets or capsules CP-2 Tablets Lanoril Salicylate  Anaprox Cuprimine Capsules Levenox Salocol  Anexsia-D Dalteparin Magan Salsalate  Anodynos Darvon compound Magnesium Salicylate Sine-off  Ansaid Dasin Capsules Magsal Sodium Salicylate  Anturane Depen Capsules Marnal Soma  APF Arthritis pain formula Dewitt's Pills Measurin Stanback  Argesic Dia-Gesic Meclofenamic Sulfinpyrazone  Arthritis Bayer Timed Release Aspirin Diclofenac Meclomen Sulindac  Arthritis pain formula Anacin Dicumarol Medipren Supac  Analgesic (Safety coated) Arthralgen Diffunasal Mefanamic Suprofen  Arthritis Strength Bufferin Dihydrocodeine Mepro Compound Suprol  Arthropan liquid Dopirydamole Methcarbomol with Aspirin Synalgos  ASA tablets/Enseals Disalcid Micrainin Tagament  Ascriptin Doan's Midol  Talwin  Ascriptin A/D Dolene Mobidin Tanderil  Ascriptin Extra Strength Dolobid Moblgesic Ticlid  Ascriptin with Codeine Doloprin or Doloprin with Codeine Momentum Tolectin  Asperbuf Duoprin Mono-gesic Trendar  Aspergum Duradyne Motrin or Motrin IB Triminicin  Aspirin plain, buffered or enteric coated Durasal Myochrisine Trigesic  Aspirin Suppositories Easprin Nalfon Trillsate  Aspirin with Codeine Ecotrin Regular or Extra Strength Naprosyn Uracel  Atromid-S Efficin Naproxen Ursinus  Auranofin Capsules Elmiron Neocylate Vanquish  Axotal Emagrin Norgesic Verin  Azathioprine Empirin or Empirin with Codeine Normiflo Vitamin E  Azolid Emprazil Nuprin Voltaren  Bayer Aspirin plain, buffered or children's or timed BC Tablets or powders Encaprin Orgaran Warfarin Sodium  Buff-a-Comp Enoxaparin Orudis Zorpin  Buff-a-Comp with Codeine Equegesic Os-Cal-Gesic   Buffaprin Excedrin plain, buffered or Extra Strength Oxalid   Bufferin Arthritis Strength Feldene Oxphenbutazone   Bufferin plain or Extra Strength Feldene Capsules Oxycodone with Aspirin   Bufferin with Codeine Fenoprofen Fenoprofen Pabalate or Pabalate-SF   Buffets II Flogesic Panagesic   Buffinol plain or Extra Strength Florinal or Florinal with Codeine Panwarfarin   Buf-Tabs Flurbiprofen Penicillamine   Butalbital Compound Four-way cold tablets Penicillin   Butazolidin Fragmin Pepto-Bismol   Carbenicillin Geminisyn Percodan   Carna Arthritis Reliever Geopen Persantine   Carprofen Gold's salt Persistin   Chloramphenicol Goody's Phenylbutazone   Chloromycetin Haltrain Piroxlcam   Clmetidine heparin Plaquenil   Cllnoril Hyco-pap Ponstel   Clofibrate Hydroxy chloroquine Propoxyphen         Before stopping any of these medications, be sure to consult the physician who ordered them.  Some, such as Coumadin (Warfarin) are ordered to prevent or treat serious conditions such as "deep thrombosis", "pumonary embolisms", and other heart  problems.  The amount of time that you may need off of the medication may also vary with the medication and the reason for which you were taking it.  If you are taking any of these medications, please make sure you notify your pain physician before you undergo any procedures.

## 2016-01-08 NOTE — Progress Notes (Signed)
PROCEDURE PERFORMED: Lumbar facet (medial branch block)   NOTE: The patient is a 40 y.o. female who returns to [place] for further evaluation and treatment of pain involving the lumbar and lower extremity region. MRI  revealed the patient to be with evidence of central and rightward protrusion L5-S1. Mild bilateral foraminal narrowing. No neural impingement is demonstrated. L4-5: Disc degeneration with bulging of the disc and mild facet hypertrophy but no stenosis or neural compression. L5-S1: Disc herniation more prominent centrally and to the right, indenting the thecal sac and abutting the S1 root sleeves. Neural compression is not demonstrated but this could be associated with back pain or neural irritation.. 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 We will proceed with interventional treatment as discussed and as explained to the patient who was understanding and wished to proceed with procedure as planned.   DESCRIPTION OF PROCEDURE: Lumbar facet (medial branch block) with IV Versed, IV fentanyl conscious sedation, EKG, blood pressure, pulse, capnography, and pulse oximetry monitoring. The procedure was performed with the patient in the prone position. Betadine prep of proposed entry site performed.   NEEDLE PLACEMENT AT: Left L 2 lumbar facet (medial branch block). Under fluoroscopic guidance with oblique orientation of 15 degrees, a 22-gauge needle was inserted at the L 2 vertebral body level with needle placed at the targeted area of Burton's Eye or Eye of the Scotty Dog with documentation of needle placement in the superior and lateral border of targeted area of Burton's Eye or Eye of the Scotty Dog with oblique orientation of 15 degrees.  Following documentation of needle placement at the L 2 vertebral body level, needle placement was then accomplished at the L 3 vertebral body level.   NEEDLE PLACEMENT AT L3, L4, and L5 VERTEBRAL BODY LEVELS ON THE LEFT SIDE The procedure was performed at the L3, L4, and L5 vertebral body levels exactly as was performed at the L 2 vertebral body level utilizing the same technique and under fluoroscopic guidance.  NEEDLE PLACEMENT AT THE SACRAL ALA with AP view of the lumbosacral spine. With the patient in the prone position, Betadine prep of proposed entry site accomplished, a 22 gauge needle was inserted in the region of the sacral ala (groove formed by the superior articulating process of S1 and the sacral wing). Following documentation of needle placement at the sacral ala,   Needle placement was then verified at all levels on lateral view. Following documentation of needle placement at all levels on lateral view and following negative aspiration for heme and CSF, each level was injected with 1 mL of 0.25% bupivacaine with Kenalog.     LUMBAR FACET, MEDIAL BRANCH NERVE, BLOCKS PERFORMED ON THE RIGHT SIDE   The procedure was performed on the right side exactly as was performed on the left side at the same levels and utilizing the same technique under fluoroscopic guidance.     The patient tolerated the procedure well. A total of 40 mg of Kenalog was utilized for the procedure.   PLAN:  1. Medications: The patient will continue presently prescribed medication hydrocodone acetaminophen 2. May consider modification of treatment regimen at time of return appointment pending response to treatment rendered on today's visit. 3. The patient is to follow-up with primary care physician Dr.R Jaynie Crumble  for further evaluation of blood pressure and general medical condition status post steroid injection performed on today's visit. 4. Surgical follow-up evaluation.Has been addressed. Patient will  follow-up with Dr. Arnoldo Morale as discussed  5. Neurological follow-up evaluation 6. The patient may be candidate for radiofrequency procedures, implantation type procedures, and other treatment pending response to treatment and follow-up evaluation. 7. The patient has been advised to call the [facility] prior to scheduled return appointment should there be significant change in condition or should patient have other concerns regarding condition prior to scheduled return appointment.  The patient is understanding and in agreement with suggested treatment plan.

## 2016-01-09 ENCOUNTER — Telehealth: Payer: Self-pay | Admitting: *Deleted

## 2016-01-09 NOTE — Telephone Encounter (Signed)
Message left

## 2016-01-10 ENCOUNTER — Telehealth: Payer: Self-pay | Admitting: Family Medicine

## 2016-01-10 ENCOUNTER — Ambulatory Visit: Payer: 59 | Admitting: Urology

## 2016-01-10 NOTE — Telephone Encounter (Signed)
Pt states she seen the urologist and she has been diagnosed with cystocele.  NZ:[id number]

## 2016-01-11 ENCOUNTER — Encounter: Payer: Self-pay | Admitting: Family Medicine

## 2016-01-11 ENCOUNTER — Encounter: Payer: Self-pay | Admitting: Urology

## 2016-01-11 ENCOUNTER — Telehealth: Payer: Self-pay | Admitting: Urology

## 2016-01-11 NOTE — Telephone Encounter (Signed)
FYI. KW 

## 2016-01-11 NOTE — Telephone Encounter (Signed)
Pt called and is complaining of lower back pain yesterday, severe lower abdominal and vagina pain and pressure, stomach feels hot to the touch and she felt something pop, like a popping sensation.  Pt took pain medicine for back but it's not touching her pain.  She wasn't sure if she needed to go to ER or what.  Please give her a call.

## 2016-01-11 NOTE — Telephone Encounter (Signed)
Per [facility] patient needs to go to the ED since we do not have a provider in the office today. Patient agreed.  Misty Zamora

## 2016-01-19 ENCOUNTER — Ambulatory Visit: Payer: Self-pay | Admitting: Family Medicine

## 2016-01-26 ENCOUNTER — Ambulatory Visit (INDEPENDENT_AMBULATORY_CARE_PROVIDER_SITE_OTHER): Payer: 59 | Admitting: Urology

## 2016-01-26 ENCOUNTER — Encounter: Payer: Self-pay | Admitting: Urology

## 2016-01-26 VITALS — BP 108/79 | HR 73 | Ht 69.0 in | Wt 221.3 lb

## 2016-01-26 DIAGNOSIS — N8111 Cystocele, midline: Secondary | ICD-10-CM

## 2016-01-26 DIAGNOSIS — N3946 Mixed incontinence: Secondary | ICD-10-CM | POA: Diagnosis not present

## 2016-01-26 NOTE — Progress Notes (Signed)
01/26/2016 2:49 PM   Keith Rake [birth year] [id number]  Referring provider: Carmon Ginsberg, [place], [place] [address]  Chief Complaint  Patient presents with  . Follow-up    Cystocele    HPI: The patient recently saw [facility] for prolapse symptoms noted. Also has mixed incontinence. She was noted to have a grade 2 cystocele as well as dyspareunia.   The patient recently feels bulging in the vaginal area especially with wiping but is not reduced it. She can leak with coughing and sneezing but not bending or lifting. She denies urge incontinence. She does have a lot of urgency but she does not leak. She denies enuresis. She wears 1-2 pads a day which are dampened  She voids for 5 times at night and does have ankle edema and sometimes takes diuretic  She voids approximately every hour due to urgency. I'm not certain if her pelvic pressure is also I driving and cessation for her frequency. She cannot sit through a to our movie. She has sometimes a feeling of incomplete bladder emptying. She can double void a moderate amount  She has degenerative disc disease. She has had a hysterectomy. She has alternating diarrhea and constipation  She feels a pressure in the lower abdomen and vagina. Is present most days. It does come and go.  The hysterectomy was for the similar symptom a year ago. It was relieved but the symptoms have returned to the last month  Modifying factors: There are no other modifying factors  Associated signs and symptoms: There are no other associated signs and symptoms Aggravating and relieving factors: There are no other aggravating or relieving factors Severity: Moderate Duration: Persistent  There was reference in the history regarding pain syndromes and pain medication  Modifying factors: There are no other modifying factors  Associated signs and symptoms: There are no other associated signs and symptoms Aggravating and relieving  factors: There are no other aggravating or relieving factors Severity: Moderate Duration: Persistent   PMH: Past Medical History:  Diagnosis Date  . Anemia    had iron infusions. last one in Sept. told levels are now normal  . Anxiety   . 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 Bulging lumbar disc    causes bilateral leg pain  . Depression   . GERD (gastroesophageal reflux disease)    occasional  . Heart murmur 2005  . History of cardiovascular stress test 2005   showed MR and TR  . History of kidney stones   . Hypertension    no meds since gastric bypass  . IDA (iron deficiency anemia)   . PONV (postoperative nausea and vomiting)    hypotension with epidural  . Sleep apnea    was better after gastric bypass, hasd recently started snoring again.  . Wears contact lenses     Surgical History: Past Surgical History:  Procedure Laterality Date  . ABDOMINAL HYSTERECTOMY  12/2014   ovaries in situ  . BILATERAL SALPINGECTOMY Bilateral 12/27/2014   Procedure: BILATERAL SALPINGECTOMY;  Surgeon: Will Bonnet, MD;  Location: [facility];  Service: Gynecology;  Laterality: Bilateral;  . COLONOSCOPY  2010  . CYSTOSCOPY N/A 12/27/2014   Procedure: CYSTOSCOPY;  Surgeon: Will Bonnet, MD;  Location: [facility];  Service: Gynecology;  Laterality: N/A;  . DILATION AND CURETTAGE OF UTERUS    . ELBOW ARTHROSCOPY Left 03/30/2015   Procedure: ARTHROSCOPY ELBOW WITH DEBRIDEMENT OF THE SYMPTOMATIC PLICA;  Surgeon: Marshall Cork  Poggi, MD;  Location: [place];  Service: Orthopedics;  Laterality: Left;  . ELBOW SURGERY Left   . ERCP    . gallbladder removed    . GASTRIC BYPASS  2012  . LAPAROSCOPIC HYSTERECTOMY N/A 12/27/2014   Procedure: HYSTERECTOMY TOTAL LAPAROSCOPIC;  Surgeon: Will Bonnet, MD;  Location: [facility];  Service: Gynecology;  Laterality: N/A;  . laproscopy    . RENAL ENDOSCOPY VIA NEPHROSTOMY / PYELOSTOMY  2010  . TUBAL LIGATION      Home Medications:      Medication List       Accurate as of 01/26/16  2:49 PM. Always use your most recent med list.          clonazePAM 0.5 MG tablet Commonly known as:  KLONOPIN TAKE 1 TABLET BY MOUTH TWICE A DAY AS NEEDED FOR ANXIETY   DULoxetine 60 MG capsule Commonly known as:  CYMBALTA Take one daily   furosemide 20 MG tablet Commonly known as:  LASIX Take 1 tablet (20 mg total) by mouth daily as needed for edema.   HYDROcodone-acetaminophen 10-325 MG tablet Commonly known as:  NORCO Limit 1 tablet by mouth 3-6 times per day if tolerated   metoCLOPramide 10 MG tablet Commonly known as:  REGLAN Take 1 tablet (10 mg total) by mouth every 6 (six) hours.   omeprazole 20 MG capsule Commonly known as:  PRILOSEC Take 20 mg by mouth daily.   promethazine 25 MG tablet Commonly known as:  PHENERGAN Take 1 tablet (25 mg total) by mouth every 6 (six) hours as needed for nausea or vomiting.   propranolol 10 MG tablet Commonly known as:  INDERAL Take 1 tablet (10 mg total) by mouth 3 (three) times daily as needed.       Allergies:  Allergies  Allergen Reactions  . Compazine [Prochlorperazine Edisylate] Other (See Comments)    Patient states it made her feel" very jittery and nervous"  . Ketorolac Palpitations    Family History: Family History  Problem Relation Age of Onset  . Hypertension Mother   . Hyperlipidemia Mother   . Diabetes Mother   . Cancer Mother     breast cancer; 04/2011  . Colon polyps Mother   . Breast cancer Mother 50  . Hyperlipidemia Father   . Hypertension Father   . Colon polyps Father   . Hypertension Sister   . Hypertension Brother   . Cancer Maternal Uncle     terminal lung cancer  . Breast cancer Maternal Aunt   . Breast cancer Paternal Aunt   . Breast cancer Paternal Grandmother   . Bladder Cancer Neg Hx   . Kidney cancer Neg Hx     Social History:  reports that she quit smoking about 16 years ago. Her smoking use included Cigarettes. She smoked  0.00 packs per day for 8.00 years. She has never used smokeless tobacco. She reports that she does not drink alcohol or use drugs.  ROS: UROLOGY Frequent Urination?: Yes Hard to postpone urination?: No Burning/pain with urination?: No Get up at night to urinate?: Yes Leakage of urine?: Yes Urine stream starts and stops?: Yes Trouble starting stream?: No Do you have to strain to urinate?: Yes Blood in urine?: No Urinary tract infection?: No Sexually transmitted disease?: No Injury to kidneys or bladder?: No Painful intercourse?: No Weak stream?: No Currently pregnant?: No Vaginal bleeding?: Yes Last menstrual period?: n  Gastrointestinal Nausea?: Yes Vomiting?: Yes Indigestion/heartburn?: No Diarrhea?: No Constipation?: No  Constitutional Fever: No Night sweats?: No Weight loss?: No Fatigue?: No  Skin Skin rash/lesions?: No Itching?: No  Eyes Blurred vision?: No Double vision?: No  Ears/Nose/Throat Sore throat?: No Sinus problems?: No  Hematologic/Lymphatic Swollen glands?: No Easy bruising?: No  Cardiovascular Leg swelling?: No Chest pain?: No  Respiratory Cough?: No Shortness of breath?: No  Endocrine Excessive thirst?: No  Musculoskeletal Back pain?: Yes Joint pain?: No  Neurological Headaches?: No Dizziness?: No  Psychologic Depression?: No Anxiety?: Yes  Physical Exam: BP 108/79   Pulse 73   Ht 5\' 9"  (1.753 m)   Wt 221 lb 4.8 oz (100.4 kg)   LMP 12/14/2014 (Exact Date)   BMI 32.68 kg/m   Constitutional:  Alert and oriented, No acute distress. HEENT: [place] AT, moist mucus membranes.  Trachea midline, no masses. Cardiovascular: No clubbing, cyanosis, or edema. Respiratory: Normal respiratory effort, no increased work of breathing. GI: Abdomen is soft, nontender, nondistended, no abdominal masses GU: On pelvic examination the patient had a modest grade 3 cystocele with central defect. The bladder had descended at rest but she really  did not have a trapdoor deformity. She had mild grade 2 hypermobility of the bladder neck but no stress incontinence. Her vaginal cuff dissented from 8 or 9 cm to approximate 6 cm. She had a diffuse mild grade 2 rectocele that did not increase in size with a Valsalva. She had some deficiency of the posterior fourchette. She did not complain of vaginal tenderness Skin: No rashes, bruises or suspicious lesions. Lymph: No cervical or inguinal adenopathy. Neurologic: Grossly intact, no focal deficits, moving all 4 extremities. Psychiatric: Normal mood and affect.  Laboratory Data: Lab Results  Component Value Date   WBC 8.3 12/24/2015   HGB 14.7 12/24/2015   HCT 42.4 12/24/2015   MCV 87.8 12/24/2015   PLT 233 12/24/2015    Lab Results  Component Value Date   CREATININE 0.76 12/24/2015    No results found for: PSA  No results found for: TESTOSTERONE  Lab Results  Component Value Date   HGBA1C 5.1 07/05/2015    Urinalysis    Component Value Date/Time   COLORURINE YELLOW (A) 12/24/2015 2134   APPEARANCEUR CLEAR (A) 12/24/2015 2134   APPEARANCEUR Cloudy 01/22/2012 0318   LABSPEC 1.010 12/24/2015 2134   LABSPEC 1.029 01/22/2012 0318   PHURINE 6.0 12/24/2015 2134   GLUCOSEU NEGATIVE 12/24/2015 2134   GLUCOSEU Negative 01/22/2012 0318   HGBUR NEGATIVE 12/24/2015 2134   BILIRUBINUR NEGATIVE 12/24/2015 2134   BILIRUBINUR negative 11/23/2015 1423   BILIRUBINUR Negative 01/22/2012 0318   KETONESUR NEGATIVE 12/24/2015 2134   PROTEINUR NEGATIVE 12/24/2015 2134   UROBILINOGEN 0.2 11/23/2015 1423   UROBILINOGEN 1.0 11/04/2014 0046   NITRITE NEGATIVE 12/24/2015 2134   LEUKOCYTESUR NEGATIVE 12/24/2015 2134   LEUKOCYTESUR Negative 01/22/2012 0318    Pertinent Imaging: None  Assessment & Plan:  The patient has a symptomatic cystocele. She has mild stress incontinence. She has significant nighttime frequency. She has significant risk factors for nighttime frequency. She has a double  voiding pattern noted. She has a lot of pelvic pressure and discomfort and dyspareunia and could have interstitial cystitis. She does have significant time frequency as well  The role of urodynamics discussed. She understands that I will also be looking for evidence of interstitial cystitis. In the future I will likely recommend a bladder hydrodistention. It would be very important to rule out interstitial cystitis before any pelvic surgery was ever performed.  I will speak to  Larene [place] regarding pain issues as noted.  I looked through the chart and the patient has been followed by a pain clinic. Very importantly after a pelvic examination in the usual fashion with a well-lubricated speculum she complained of a lot of vaginal burning or pain. She takes multiple pain medication per day I believe for her back but she's also been the emergency room with abdominal pain and epigastric pain. The patient understood the concept of being hyperesthetic today post vaginal exam  I drew her a picture. She understands if she ever had surgery she would likely best benefit from a transvaginal vault suspension cystocele repair and graft. She may or may not require a sling. She would be consented for rectocele repair but likely would not need 1. She understands that she would need to have interstitial cystitis ruled out prior to any pelvic floor surgery. I detailed a hydrodistention today and we'll likely order one in the future as a separate procedure. I went over risk in my usual template.        2. Stress incontinence 3. Urinary frequency 4. Nighttime frequency   No Follow-up on file.  Reece Packer, MD  [facility] [address] [place], [place] [address] [phone]

## 2016-01-29 ENCOUNTER — Ambulatory Visit: Payer: 59 | Admitting: Pain Medicine

## 2016-01-29 ENCOUNTER — Telehealth: Payer: Self-pay | Admitting: Family Medicine

## 2016-01-29 NOTE — Telephone Encounter (Signed)
I am concerned about this request. Dr. Primus Bravo has assumed prescribing of her narcotic pain medication and did so on 8/2. If she has run out of medication early she may discuss it with him.

## 2016-01-29 NOTE — Telephone Encounter (Signed)
Spoke with patient on the phone who states that last time she was seen in our office that she was only prescribed a certain number of pills till her appt with Dr. Primus Bravo on 8/2. Patient states that when she saw Dr. Primus Bravo he only filled qty amount for the other half of remaning script instead of a full script. Patient states that she has an appt on 8/30 with Dr. Primus Bravo and that she did not run out of medication early, she states that she took the appropriate amount as directed and she is requesting that you refill medication to last her till she sees Dr. Primus Bravo on the 30th. Please advise. KW

## 2016-01-29 NOTE — Telephone Encounter (Signed)
Pt needs a refill on her pain meds.  She said she doesn't have an appt at the pain clinic for a couple days and her prescription has expired.  Her call back is [phone].  Thanks [employer]

## 2016-01-29 NOTE — Telephone Encounter (Signed)
She will have to take that up with  Dr. Primus Bravo.

## 2016-01-29 NOTE — Telephone Encounter (Signed)
Pt is returning call.  NZ:[id number]

## 2016-01-29 NOTE — Telephone Encounter (Signed)
For review. KW 

## 2016-01-29 NOTE — Telephone Encounter (Signed)
Patient advised.KW 

## 2016-01-29 NOTE — Telephone Encounter (Signed)
[id number] 

## 2016-01-29 NOTE — Telephone Encounter (Signed)
[id number] 

## 2016-01-31 ENCOUNTER — Ambulatory Visit: Payer: 59 | Attending: Pain Medicine | Admitting: Pain Medicine

## 2016-01-31 ENCOUNTER — Encounter: Payer: Self-pay | Admitting: Pain Medicine

## 2016-01-31 VITALS — BP 113/74 | HR 74 | Temp 98.4°F | Resp 16 | Ht 69.0 in | Wt 219.0 lb

## 2016-01-31 DIAGNOSIS — M5127 Other intervertebral disc displacement, lumbosacral region: Secondary | ICD-10-CM | POA: Insufficient documentation

## 2016-01-31 DIAGNOSIS — G8929 Other chronic pain: Secondary | ICD-10-CM

## 2016-01-31 DIAGNOSIS — M5126 Other intervertebral disc displacement, lumbar region: Secondary | ICD-10-CM

## 2016-01-31 DIAGNOSIS — M545 Low back pain: Secondary | ICD-10-CM | POA: Diagnosis present

## 2016-01-31 DIAGNOSIS — M5136 Other intervertebral disc degeneration, lumbar region: Secondary | ICD-10-CM

## 2016-01-31 DIAGNOSIS — M5116 Intervertebral disc disorders with radiculopathy, lumbar region: Secondary | ICD-10-CM | POA: Insufficient documentation

## 2016-01-31 DIAGNOSIS — M79604 Pain in right leg: Secondary | ICD-10-CM | POA: Diagnosis present

## 2016-01-31 DIAGNOSIS — M62830 Muscle spasm of back: Secondary | ICD-10-CM | POA: Diagnosis not present

## 2016-01-31 DIAGNOSIS — M533 Sacrococcygeal disorders, not elsewhere classified: Secondary | ICD-10-CM | POA: Insufficient documentation

## 2016-01-31 DIAGNOSIS — M549 Dorsalgia, unspecified: Secondary | ICD-10-CM

## 2016-01-31 DIAGNOSIS — M79605 Pain in left leg: Secondary | ICD-10-CM | POA: Diagnosis present

## 2016-01-31 DIAGNOSIS — M5415 Radiculopathy, thoracolumbar region: Secondary | ICD-10-CM

## 2016-01-31 MED ORDER — HYDROCODONE-ACETAMINOPHEN 10-325 MG PO TABS: ORAL_TABLET | ORAL | 0 refills | Status: DC

## 2016-01-31 NOTE — Patient Instructions (Addendum)
PLAN  Continue present medication hydrocodone acetaminophen  Block of nerves to the sacroiliac joint to be performed at time of return appointment  F/U PCP R Chauvin for evaliation of  BP and general medical  condition  F/U surgical evaluation. Patient to follow-up with Dr. Arnoldo Morale for neurosurgical reevaluation as needed. Dr. Arnoldo Morale has recommended patient continue treatment in pain management  F/U neurological evaluation. . We will proceed with the [facility] EMG studies of the upper extremities and lower extremities. Ask the nurses and secretary the date of your nerve conduction velocity studies/electromyographic studies  May consider radiofrequency rhizolysis or intraspinal procedures pending response to present treatment and F/U evaluation   Patient to call Pain Management Center should patient have concerns prior to scheduled return appointment.  Sacroiliac (SI) Joint Injection Patient Information  Description: The sacroiliac joint connects the scrum (very low back and tailbone) to the ilium (a pelvic bone which also forms half of the hip joint).  Normally this joint experiences very little motion.  When this joint becomes inflamed or unstable low back and or hip and pelvis pain may result.  Injection of this joint with local anesthetics (numbing medicines) and steroids can provide diagnostic information and reduce pain.  This injection is performed with the aid of x-ray guidance into the tailbone area while you are lying on your stomach.   You may experience an electrical sensation down the leg while this is being done.  You may also experience numbness.  We also may ask if we are reproducing your normal pain during the injection.  Conditions which may be treated SI injection:   Low back, buttock, hip or leg pain  Preparation for the Injection:  1. Do not eat any solid food or dairy products within 8 hours of your appointment.  2. You may drink clear liquids up to 3 hours before  appointment.  Clear liquids include water, black coffee, juice or soda.  No milk or cream please. 3. You may take your regular medications, including pain medications with a sip of water before your appointment.  Diabetics should hold regular insulin (if take separately) and take 1/2 normal NPH dose the morning of the procedure.  Carry some sugar containing items with you to your appointment. 4. A driver must accompany you and be prepared to drive you home after your procedure. 5. Bring all of your current medications with you. 6. An IV may be inserted and sedation may be given at the discretion of the physician. 7. A blood pressure cuff, EKG and other monitors will often be applied during the procedure.  Some patients may need to have extra oxygen administered for a short period.  8. You will be asked to provide medical information, including your allergies, prior to the procedure.  We must know immediately if you are taking blood thinners (like Coumadin/Warfarin) or if you are allergic to IV iodine contrast (dye).  We must know if you could possible be pregnant.  Possible side effects:   Bleeding from needle site  Infection (rare, may require surgery)  Nerve injury (rare)  Numbness & tingling (temporary)  A brief convulsion or seizure  Light-headedness (temporary)  Pain at injection site (several days)  Decreased blood pressure (temporary)  Weakness in the leg (temporary)   Call if you experience:   New onset weakness or numbness of an extremity below the injection site that last more than 8 hours.  Hives or difficulty breathing ( go to the emergency room)  Inflammation or  drainage at the injection site  Any new symptoms which are concerning to you  Please note:  Although the local anesthetic injected can often make your back/ hip/ buttock/ leg feel good for several hours after the injections, the pain will likely return.  It takes 3-7 days for steroids to work in the  sacroiliac area.  You may not notice any pain relief for at least that one week.  If effective, we will often do a series of three injections spaced 3-6 weeks apart to maximally decrease your pain.  After the initial series, we generally will wait some months before a repeat injection of the same type.  If you have any questions, please call [phone] [facility]

## 2016-01-31 NOTE — Progress Notes (Signed)
The patient is a 40 year old female who returns to pain management for further evaluation and treatment of pain involving the lower back and lower extremity regions predominantly. The patient also is with complaint of pain involving the cervical region and thoracic regions of lesser degree. The patient is with pain of the lumbar lower extremity region with pain going across the region of the buttocks and continuing to the lower extremities. We reviewed MRI findings on today's visit and discussed patient's symptoms. The patient appears to be with component of pain due to sacroiliac joint involvement. After discussion of patient's condition and decision was made to proceed with PNCV/EMG studies to further evaluate etiology of patient's lower extremity symptoms as well as upper extremity symptoms and proceed with block of nerves to the sacroiliac joint at time return appointment for both diagnostic and therapeutic purposes. The patient denied any recent trauma change in events of daily living the call significant change in symptomatology. We will continue presently prescribed medications at this time and will await the results further studies as discussed. The patient will follow-up with surgeon pending further assessment of patient's. The patient is undergone recent surgical evaluation without recommendation for surgical intervention at this time. We will continue hydrocodone acetaminophen at this time and we'll consider additional modifications of treatment regimen pending follow-up evaluation. All agreed to suggested treatment plan.    Physical examination  There was tenderness to palpation of the paraspinal musculature in the cervical region cervical facet region palpation which reproduces pain of mild-to-moderate degree with mild to moderate tenderness of the splenius capitis and occipitalis regions. There was mild to moderate tenderness of the acromioclavicular and glenohumeral joint regions. The  patient appeared to be without significant tenderness of the acromioclavicular and glenohumeral joint regions. The patient appeared to be with unremarkable Spurling's maneuver. Palpation over the thoracic region was with tenderness to palpation with palpation over the thoracic paraspinal musculature region being with evidence of muscle spasm. No crepitus of the thoracic region was noted. Palpation over the lumbar paraspinal musculatures and lumbar facet region was with tenderness to palpation of moderate degree with moderate to moderately severe tenderness of the PSIS and PII S regions. Lateral bending rotation extension and palpation of the lumbar facets reproduce moderate discomfort. Palpation of the greater trochanteric region iliotibial band region was with mild tenderness to palpation. Straight leg raising was tolerates approximately 30 without a definite increase of pain with dorsiflexion noted. There was negative clonus negative Homans. Abdomen nontender with no costovertebral angle tenderness noted.    Assessment   Degenerative disc disease lumbar spine IMPRESSION: LUMBAR MYELOGRAM IMPRESSION:  No nerve root cut off or spinal stenosis. No dynamic instability. Disc space narrowing L5-S1.  CT LUMBAR MYELOGRAM IMPRESSION:  Central and rightward protrusion L5-S1. Mild BILATERAL foraminal narrowing. No neural impingement is demonstrated.  LumbIMPRESSION: L4-5: Disc degeneration with bulging of the disc and mild facet hypertrophy but no stenosis or neural compression.  L5-S1: Disc herniation more prominent centrally and to the right, indenting the thecal sac and abutting the S1 root sleeves. Neural compression is not demonstrated but this could be associated with back pain or neural irritation.ar MRI  Lumbar facet syndrome  Lumbar radiculopathy  Sacroiliac joint dysfunction  Cervical facet syndrome       PLAN  Continue present medication hydrocodone  acetaminophen  Block of nerves to the sacroiliac joint to be performed at time of return appointment  F/U PCP R Chauvin for evaliation of  BP and  general medical  condition  F/U surgical evaluation. Patient to follow-up with Dr. Arnoldo Morale for neurosurgical reevaluation as needed. Dr. Arnoldo Morale has recommended patient continue treatment in pain management  F/U neurological evaluation. . We will proceed with the PNCV EMG studies of the upper extremities and lower extremities. Ask the nurses and secretary the date of your nerve conduction velocity studies/electromyographic studies  May consider radiofrequency rhizolysis or intraspinal procedures pending response to present treatment and F/U evaluation   [facility] to call [facility] should patient have concerns prior to scheduled return appointment.

## 2016-01-31 NOTE — Progress Notes (Signed)
Safety precautions to be maintained throughout the outpatient stay will include: orient to surroundings, keep bed in low position, maintain call bell within reach at all times, provide assistance with transfer out of bed and ambulation.  

## 2016-02-01 ENCOUNTER — Ambulatory Visit: Payer: 59 | Admitting: Pain Medicine

## 2016-02-02 ENCOUNTER — Inpatient Hospital Stay: Payer: 59

## 2016-02-02 ENCOUNTER — Telehealth: Payer: Self-pay | Admitting: Urology

## 2016-02-02 ENCOUNTER — Inpatient Hospital Stay: Payer: 59 | Admitting: Internal Medicine

## 2016-02-02 DIAGNOSIS — N9489 Other specified conditions associated with female genital organs and menstrual cycle: Secondary | ICD-10-CM | POA: Diagnosis not present

## 2016-02-02 DIAGNOSIS — N993 Prolapse of vaginal vault after hysterectomy: Secondary | ICD-10-CM | POA: Diagnosis not present

## 2016-02-02 DIAGNOSIS — N393 Stress incontinence (female) (male): Secondary | ICD-10-CM | POA: Diagnosis not present

## 2016-02-02 NOTE — Telephone Encounter (Signed)
Pt called and stated she found another dr who was more personable and could take care of her needs sooner than Dr. Matilde Sprang.  She wants to cancel her UDS appt at [facility] and her results appt at [facility].  Just F.Y.I.

## 2016-02-06 DIAGNOSIS — Z01818 Encounter for other preprocedural examination: Secondary | ICD-10-CM | POA: Diagnosis not present

## 2016-02-07 ENCOUNTER — Encounter: Payer: Self-pay | Admitting: Family Medicine

## 2016-02-07 ENCOUNTER — Telehealth: Payer: Self-pay | Admitting: Family Medicine

## 2016-02-07 NOTE — Telephone Encounter (Signed)
The paper should state intermitted leave is ok.  Number of episodes should say 3 episodes per week.  Durations should be 12 hrs x 3 days a week. For flare ups and dr's appt,  This is die before sept 14th.

## 2016-02-07 NOTE — Telephone Encounter (Signed)
Pt called needing her FMLA papers amended.  She needs the times for appts. Changed.  They say she is exceeding the amount of dates by the way it is written.  You can call patient if you needs more info. [phone]  Thank sTeri

## 2016-02-08 NOTE — Telephone Encounter (Signed)
Please have her fax/bring me another FMLA form and I will revise it.

## 2016-02-08 NOTE — Telephone Encounter (Signed)
Pt going to fax form. Renaldo Fiddler, CMA

## 2016-02-13 ENCOUNTER — Telehealth: Payer: Self-pay | Admitting: *Deleted

## 2016-02-14 ENCOUNTER — Telehealth: Payer: Self-pay | Admitting: *Deleted

## 2016-02-14 ENCOUNTER — Other Ambulatory Visit: Payer: Self-pay | Admitting: Family Medicine

## 2016-02-14 ENCOUNTER — Telehealth: Payer: Self-pay | Admitting: Family Medicine

## 2016-02-14 DIAGNOSIS — M545 Low back pain, unspecified: Secondary | ICD-10-CM

## 2016-02-14 DIAGNOSIS — G8929 Other chronic pain: Secondary | ICD-10-CM

## 2016-02-14 NOTE — Telephone Encounter (Signed)
Referral in progress. Where did Dr. Primus Bravo go?

## 2016-02-14 NOTE — Telephone Encounter (Signed)
Pt called saying since Dr. Primus Bravo left the pain clinic she wants to know if you will set her up with the [facility].  She said since Dr. Primus Bravo has left they cant see her until December.  Her call back is [phone]  Thanks Con Memos

## 2016-02-14 NOTE — Telephone Encounter (Signed)
Please advise. Thanks.  

## 2016-02-14 NOTE — Telephone Encounter (Signed)
I have no clue and others around here do not know yet. I have had another pt today say he left as well.

## 2016-02-15 NOTE — Telephone Encounter (Signed)
Please let the patient know the referral is in progress and ask her about Dr. Ethel Rana disappearance.

## 2016-02-15 NOTE — Telephone Encounter (Signed)
[id number] 

## 2016-02-19 DIAGNOSIS — D649 Anemia, unspecified: Secondary | ICD-10-CM | POA: Diagnosis not present

## 2016-02-19 DIAGNOSIS — N993 Prolapse of vaginal vault after hysterectomy: Secondary | ICD-10-CM | POA: Diagnosis not present

## 2016-02-19 DIAGNOSIS — I10 Essential (primary) hypertension: Secondary | ICD-10-CM | POA: Diagnosis not present

## 2016-02-19 DIAGNOSIS — M549 Dorsalgia, unspecified: Secondary | ICD-10-CM | POA: Diagnosis not present

## 2016-02-19 DIAGNOSIS — Z90710 Acquired absence of both cervix and uterus: Secondary | ICD-10-CM | POA: Diagnosis not present

## 2016-02-19 DIAGNOSIS — K219 Gastro-esophageal reflux disease without esophagitis: Secondary | ICD-10-CM | POA: Diagnosis not present

## 2016-02-19 DIAGNOSIS — G8929 Other chronic pain: Secondary | ICD-10-CM | POA: Diagnosis not present

## 2016-02-19 DIAGNOSIS — N393 Stress incontinence (female) (male): Secondary | ICD-10-CM | POA: Diagnosis not present

## 2016-02-19 DIAGNOSIS — E669 Obesity, unspecified: Secondary | ICD-10-CM | POA: Diagnosis not present

## 2016-02-19 DIAGNOSIS — N8111 Cystocele, midline: Secondary | ICD-10-CM | POA: Diagnosis not present

## 2016-02-19 DIAGNOSIS — E049 Nontoxic goiter, unspecified: Secondary | ICD-10-CM | POA: Diagnosis not present

## 2016-02-19 NOTE — Telephone Encounter (Signed)
[id number] 

## 2016-02-20 DIAGNOSIS — D649 Anemia, unspecified: Secondary | ICD-10-CM | POA: Diagnosis not present

## 2016-02-20 DIAGNOSIS — N393 Stress incontinence (female) (male): Secondary | ICD-10-CM | POA: Diagnosis not present

## 2016-02-20 DIAGNOSIS — I10 Essential (primary) hypertension: Secondary | ICD-10-CM | POA: Diagnosis not present

## 2016-02-20 DIAGNOSIS — M549 Dorsalgia, unspecified: Secondary | ICD-10-CM | POA: Diagnosis not present

## 2016-02-20 DIAGNOSIS — G8929 Other chronic pain: Secondary | ICD-10-CM | POA: Diagnosis not present

## 2016-02-20 DIAGNOSIS — E669 Obesity, unspecified: Secondary | ICD-10-CM | POA: Diagnosis not present

## 2016-02-20 DIAGNOSIS — N993 Prolapse of vaginal vault after hysterectomy: Secondary | ICD-10-CM | POA: Diagnosis not present

## 2016-02-20 DIAGNOSIS — E049 Nontoxic goiter, unspecified: Secondary | ICD-10-CM | POA: Diagnosis not present

## 2016-02-20 DIAGNOSIS — K219 Gastro-esophageal reflux disease without esophagitis: Secondary | ICD-10-CM | POA: Diagnosis not present

## 2016-03-01 ENCOUNTER — Inpatient Hospital Stay: Payer: 59

## 2016-03-01 ENCOUNTER — Inpatient Hospital Stay: Payer: 59 | Admitting: Internal Medicine

## 2016-03-13 ENCOUNTER — Encounter: Payer: Self-pay | Admitting: Family Medicine

## 2016-03-13 ENCOUNTER — Other Ambulatory Visit: Payer: Self-pay | Admitting: Pain Medicine

## 2016-03-13 DIAGNOSIS — M545 Low back pain: Principal | ICD-10-CM

## 2016-03-13 DIAGNOSIS — G8929 Other chronic pain: Secondary | ICD-10-CM

## 2016-03-13 DIAGNOSIS — M549 Dorsalgia, unspecified: Principal | ICD-10-CM

## 2016-03-13 MED ORDER — HYDROCODONE-ACETAMINOPHEN 10-325 MG PO TABS: ORAL_TABLET | ORAL | 0 refills | Status: DC

## 2016-03-18 ENCOUNTER — Telehealth: Payer: Self-pay | Admitting: Family Medicine

## 2016-03-18 NOTE — Telephone Encounter (Signed)
Pt has bilat red itchy eyes. They are draining also.  Not sure if it is pink or allergies.  Please advise [phone]  Thanks Con Memos

## 2016-03-18 NOTE — Telephone Encounter (Signed)
Spoke with patient on the phone she states that she has had redness and itching on both eyes that began yesterday 10/15. Patient states this morning eyes were matted shut, she has had cold like symptoms and congestion. I advised patient office visit would be needed for evaluation. Appt scheduled 03/19/16. KW

## 2016-03-19 ENCOUNTER — Ambulatory Visit (INDEPENDENT_AMBULATORY_CARE_PROVIDER_SITE_OTHER): Payer: 59 | Admitting: Family Medicine

## 2016-03-19 ENCOUNTER — Encounter: Payer: Self-pay | Admitting: Family Medicine

## 2016-03-19 VITALS — BP 110/68 | HR 66 | Temp 97.8°F | Resp 16 | Wt 223.2 lb

## 2016-03-19 DIAGNOSIS — Z23 Encounter for immunization: Secondary | ICD-10-CM

## 2016-03-19 DIAGNOSIS — B309 Viral conjunctivitis, unspecified: Secondary | ICD-10-CM | POA: Diagnosis not present

## 2016-03-19 MED ORDER — SULFACETAMIDE SODIUM 10 % OP SOLN: 2.0000 [drp] | Freq: Four times a day (QID) | OPHTHALMIC | 0 refills | Status: DC

## 2016-03-19 NOTE — Patient Instructions (Signed)
Start wet, warm compresses several x day. Start eye antibiotic if eye matting returns.

## 2016-03-19 NOTE — Progress Notes (Signed)
Subjective:     Patient ID: Misty Zamora, female   DOB: 07-05-1975, 40 y.o.   MRN: [id number]  HPI  Chief Complaint  Patient presents with  . Eye Problem    Patient comes in office today with concerns of itching and redness in both eyes for the past two days. Patient states that 03/17/16 her eyes were matted shut and had discharge from her inner eye, patient states that initally it was right eye than spread to her left. Associated symptoms include sinus pain/pressure and cough  States cold sx have resolved and eyes are staring to feel better. She has removed her contacts and is wearing glasses today. Requires a return to work excuse. Wishes flu vaccine today. Had successful urogynecology surgery last month for cystocele and stress incontinence.   Review of Systems     Objective:   Physical Exam  Constitutional: She appears well-developed and well-nourished. No distress.  Eyes: Conjunctivae are normal. Pupils are equal, round, and reactive to light. Right eye exhibits no discharge. Left eye exhibits no discharge.  V.A. Intact to # of fingers.       Assessment:    1. Need for influenza vaccination - Flu Vaccine QUAD 36+ mos IM  2. Acute viral conjunctivitis of both eyes: resolving - sulfacetamide (BLEPH-10) 10 % ophthalmic solution; Place 2 drops into both eyes 4 (four) times daily.  Dispense: 15 mL; Refill: 0    Plan:    Discussed use of warm compresses. Wrote return to work excuse. Will start eye abx if matting recurs.

## 2016-03-28 ENCOUNTER — Ambulatory Visit (INDEPENDENT_AMBULATORY_CARE_PROVIDER_SITE_OTHER): Payer: 59 | Admitting: Family Medicine

## 2016-03-28 ENCOUNTER — Encounter: Payer: Self-pay | Admitting: Family Medicine

## 2016-03-28 ENCOUNTER — Telehealth: Payer: Self-pay | Admitting: Family Medicine

## 2016-03-28 VITALS — BP 122/82 | HR 64 | Temp 97.9°F | Resp 16 | Wt 223.0 lb

## 2016-03-28 DIAGNOSIS — H1033 Unspecified acute conjunctivitis, bilateral: Secondary | ICD-10-CM | POA: Diagnosis not present

## 2016-03-28 MED ORDER — ERYTHROMYCIN 5 MG/GM OP OINT
1.0000 | TOPICAL_OINTMENT | Freq: Four times a day (QID) | OPHTHALMIC | 0 refills | Status: DC
Start: 2016-03-28 — End: 2016-06-05

## 2016-03-28 NOTE — Telephone Encounter (Signed)
error 

## 2016-03-28 NOTE — Progress Notes (Signed)
Subjective:     Patient ID: Misty Zamora, female   DOB: 11-Dec-1975, 40 y.o.   MRN: [id number]  HPI  Chief Complaint  Patient presents with  . Eye Pain    Patient returns to clinic today for bilateral eye pain and redness. Last office visit patien was seen and treated with [id number] opthamalic solution, patient reports that eye seemed to clear at last visit and she did not start eye solution until yesterday when symptom returned. Patient reports that once she placed drops in eyes yesterday her eyes immediatley began to burn, patient also complains of sensitivity to light. Patient reports that she has had redness and matting of eyes.   States she thought her eyes were better from the last office visit and hadresumed new contact lenses. Reports a feeling of "sand" in her eye as well. She is wearing sunglasses today and has a driver due to photophobia. Also discussed letter I received from [facility] stating they could not accept her. As she has an appointment scheduled she will check on that.   Review of Systems     Objective:   Physical Exam  Constitutional: She appears well-developed and well-nourished. No distress.  Eyes: Right eye exhibits no discharge. Left eye exhibits no discharge.  Mild scleral injection. Tactile pressures equal. Visual acuity intact to # of fingers. Pupils equal.       Assessment:    1. Acute conjunctivitis of both eyes, unspecified acute conjunctivitis type - erythromycin ophthalmic ointment; Place 1 application into both eyes 4 (four) times daily.  Dispense: 3.5 g; Refill: 0    Plan:    start warm compresses. She will access her eye doctor if not improving in the next 24-48 hours.

## 2016-03-28 NOTE — Patient Instructions (Addendum)
Discussed use of warm compresses. See your eye doctor if not improving in the next 24-48 hours.

## 2016-04-01 ENCOUNTER — Ambulatory Visit: Payer: 59 | Admitting: Pain Medicine

## 2016-04-06 ENCOUNTER — Encounter: Payer: Self-pay | Admitting: *Deleted

## 2016-04-06 ENCOUNTER — Ambulatory Visit
Admission: EM | Admit: 2016-04-06 | Discharge: 2016-04-06 | Disposition: A | Discharge status: Home / Self Care | Payer: 59 | Attending: Family Medicine | Admitting: Family Medicine

## 2016-04-06 DIAGNOSIS — J069 Acute upper respiratory infection, unspecified: Secondary | ICD-10-CM | POA: Diagnosis not present

## 2016-04-06 LAB — RAPID INFLUENZA A&B ANTIGENS (ARMC ONLY): INFLUENZA A (ARMC): NEGATIVE

## 2016-04-06 LAB — RAPID INFLUENZA A&B ANTIGENS: Influenza B (ARMC): NEGATIVE

## 2016-04-06 MED ORDER — BENZONATATE 100 MG PO CAPS
100.0000 mg | ORAL_CAPSULE | Freq: Three times a day (TID) | ORAL | 0 refills | Status: DC | PRN
Start: 2016-04-06 — End: 2016-06-05

## 2016-04-06 NOTE — Discharge Instructions (Signed)
Take medication as prescribed. Rest. Drink plenty of fluids.  ° °Follow up with your primary care physician this week as needed. Return to Urgent care for new or worsening concerns.  ° °

## 2016-04-06 NOTE — ED Provider Notes (Signed)
[facility] ____________________________________________  Time seen: Approximately 4:36 PM  I have reviewed the triage vital signs and the nursing notes.   HISTORY  Chief Complaint Nasal Congestion and Generalized Body Aches  HPI Misty Zamora is a 40 y.o. female presents with complaints of 2 days of runny nose, nasal congestion and cough. Patient reports hurts in children at home have recently had similar just prior to her symptom onset. Patient reports continues to eat and drink well. Denies any fevers. Patient reports she started take Sudafed last night, no medications in addition today. States clear nasal drainage. States frequently blowing her nose. Patient expresses concern over pneumonia as she reports last time she felt like this she ended up developing pneumonia. Patient states she does not think she has pneumonia at this time but states she wants an antibiotic to prevent developing pneumonia.  Patient again denies fevers. Denies chest pain, shortness breath, chest pain with deep breath, abdominal pain, dysuria, extremity pain or extremity swelling. Denies recent antibiotic use. Denies recent sickness.  Carmon Ginsberg, PA: PCP LMP: hysterectomy    Past Medical History:  Diagnosis Date  . Anemia    had iron infusions. last one in Sept. told levels are now normal  . Anxiety   . 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 Bulging lumbar disc    causes bilateral leg pain  . Depression   . GERD (gastroesophageal reflux disease)    occasional  . Heart murmur 2005  . History of cardiovascular stress test 2005   showed MR and TR  . History of kidney stones   . Hypertension    no meds since gastric bypass  . IDA (iron deficiency anemia)   . PONV (postoperative nausea and vomiting)    hypotension with epidural  . Sleep apnea    was better after gastric bypass, hasd recently started snoring again.  . Wears contact lenses     Patient Active Problem List   Diagnosis Date Noted  . Adiposity 12/21/2015  . H/O sinus tachycardia 12/21/2015  . DDD (degenerative disc disease), lumbar 10/03/2015  . Neuritis or radiculitis due to rupture of lumbar intervertebral disc 06/07/2015  . 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 Other intervertebral disc displacement, lumbar region 06/07/2015  . Obesity 04/07/2015  . Allergic rhinitis 02/18/2015  . Anxiety 02/18/2015  . H/O renal calculi 02/18/2015  . 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 Multinodular goiter 01/02/2015  . Status post laparoscopic hysterectomy 12/27/2014  . Chronic female pelvic pain 12/08/2014  . IDA (iron deficiency anemia) 12/01/2014  . Abdominal pain, chronic, epigastric 01/29/2012    Past Surgical History:  Procedure Laterality Date  . ABDOMINAL HYSTERECTOMY  12/2014   ovaries in situ  . BILATERAL SALPINGECTOMY Bilateral 12/27/2014   Procedure: BILATERAL SALPINGECTOMY;  Surgeon: Will Bonnet, MD;  Location: [facility];  Service: Gynecology;  Laterality: Bilateral;  . COLONOSCOPY  2010  . CYSTOSCOPY N/A 12/27/2014   Procedure: CYSTOSCOPY;  Surgeon: Will Bonnet, MD;  Location: [facility];  Service: Gynecology;  Laterality: N/A;  . DILATION AND CURETTAGE OF UTERUS    . ELBOW ARTHROSCOPY Left 03/30/2015   Procedure: ARTHROSCOPY ELBOW WITH DEBRIDEMENT OF THE SYMPTOMATIC PLICA;  Surgeon: Corky Mull, MD;  Location: [facility];  Service: Orthopedics;  Laterality: Left;  . ELBOW SURGERY Left   . ERCP    . gallbladder removed    . GASTRIC BYPASS  2012  . LAPAROSCOPIC HYSTERECTOMY N/A 12/27/2014  Procedure: HYSTERECTOMY TOTAL LAPAROSCOPIC;  Surgeon: Will Bonnet, MD;  Location: [facility];  Service: Gynecology;  Laterality: N/A;  . laproscopy    . RENAL ENDOSCOPY VIA NEPHROSTOMY / PYELOSTOMY  2010  . TUBAL LIGATION      Current Outpatient Rx  . Order #: [id number] Class: Phone In  . Order #: [id number] Class: Normal  . Order #: [id number] Class: Normal  . Order  #: [id number] Class: Normal  . Order #: [id number] Class: Normal  . Order #: [id number] Class: Historical Med  . Order #: [id number] Class: Print  . Order #: [id number] Class: Historical Med  . Order #: [id number] Class: Print     Current Facility-Administered Medications:  .  bupivacaine (PF) (MARCAINE) 0.25 % injection 30 mL, 30 mL, Other, Once, Mohammed Kindle, MD .  lactated ringers infusion 1,000 mL, 1,000 mL, Intravenous, Continuous, Mohammed Kindle, MD, Last Rate: 125 mL/hr at 10/16/15 1045, 1,000 mL at 10/16/15 1045 .  lactated ringers infusion 1,000 mL, 1,000 mL, Intravenous, Continuous, Mohammed Kindle, MD .  lactated ringers infusion 1,000 mL, 1,000 mL, Intravenous, Continuous, Mohammed Kindle, MD .  lidocaine (PF) (XYLOCAINE) 1 % injection 10 mL, 10 mL, Subcutaneous, Once, Mohammed Kindle, MD .  sodium chloride flush (NS) 0.9 % injection 20 mL, 20 mL, Other, Once, Mohammed Kindle, MD .  triamcinolone acetonide (KENALOG-40) injection 40 mg, 40 mg, Other, Once, Mohammed Kindle, MD  Current Outpatient Prescriptions:  .  clonazePAM (KLONOPIN) 0.5 MG tablet, TAKE 1 TABLET BY MOUTH TWICE A DAY AS NEEDED FOR ANXIETY, Disp: 60 tablet, Rfl: 5 .  DULoxetine (CYMBALTA) 60 MG capsule, Take one daily, Disp: 30 capsule, Rfl: 5 .  benzonatate (TESSALON PERLES) 100 MG capsule, Take 1 capsule (100 mg total) by mouth 3 (three) times daily as needed., Disp: 15 capsule, Rfl: 0 .  erythromycin ophthalmic ointment, Place 1 application into both eyes 4 (four) times daily., Disp: 3.5 g, Rfl: 0 .  furosemide (LASIX) 20 MG tablet, Take 1 tablet (20 mg total) by mouth daily as needed for edema., Disp: 30 tablet, Rfl: 1 .  furosemide (LASIX) 20 MG tablet, Take by mouth., Disp: , Rfl:  .  HYDROcodone-acetaminophen (NORCO) 10-325 MG tablet, Limit 1 tablet by mouth 3-6 times per day if tolerated, Disp: 150 tablet, Rfl: 0 .  HYDROcodone-acetaminophen (NORCO) 10-325 MG tablet, Take by mouth., Disp: , Rfl:  .  sulfacetamide (BLEPH-10)  10 % ophthalmic solution, Place 2 drops into both eyes 4 (four) times daily., Disp: 15 mL, Rfl: 0  Allergies Compazine [prochlorperazine edisylate] and Ketorolac  Family History  Problem Relation Age of Onset  . Hypertension Mother   . Hyperlipidemia Mother   . Diabetes Mother   . Cancer Mother     breast cancer; 04/2011  . Colon polyps Mother   . Breast cancer Mother 61  . Hyperlipidemia Father   . Hypertension Father   . Colon polyps Father   . Hypertension Sister   . Hypertension Brother   . Cancer Maternal Uncle     terminal lung cancer  . Breast cancer Maternal Aunt   . Breast cancer Paternal Aunt   . Breast cancer Paternal Grandmother   . Bladder Cancer Neg Hx   . Kidney cancer Neg Hx     Social History Social History  Substance Use Topics  . Smoking status: Former Smoker    Packs/day: 0.00    Years: 8.00    Types: Cigarettes    Quit date: 06/04/1999  . Smokeless tobacco: Never Used  . Alcohol use  No     Comment: 1 drink every few months    Review of Systems Constitutional: No fever/chills Eyes: No visual changes. ENT: No sore throat. Cardiovascular: Denies chest pain. Respiratory: Denies shortness of breath. Gastrointestinal: No abdominal pain.  No nausea, no vomiting.  No diarrhea.  No constipation. Genitourinary: Negative for dysuria. Musculoskeletal: Negative for back pain. Skin: Negative for rash. Neurological: Negative for headaches, focal weakness or numbness.  10-point ROS otherwise negative.  ____________________________________________   PHYSICAL EXAM:  VITAL SIGNS: ED Triage Vitals  Enc Vitals Group     BP 04/06/16 1605 122/86     Pulse Rate 04/06/16 1605 97     Resp 04/06/16 1605 18     Temp 04/06/16 1605 97.2 F (36.2 C)     Temp Source 04/06/16 1605 Oral     SpO2 04/06/16 1605 99 %     Weight 04/06/16 1605 216 lb (98 kg)     Height 04/06/16 1605 5\' 9"  (1.753 m)     Head Circumference --      Peak Flow --      Pain Score  04/06/16 1608 0     Pain Loc --      Pain Edu? --      Excl. in [place]? --    Constitutional: Alert and oriented. Well appearing and in no acute distress. Eyes: Conjunctivae are normal. PERRL. EOMI. Head: [email or web address] tenderness to palpation to bilateral frontal and maxillary sinuses. No swelling. No erythema.  Ears: no erythema, normal TMs bilaterally.   Nose:Nasal congestion with clear rhinorrhea  Mouth/Throat: Mucous membranes are moist. No pharyngeal erythema. No tonsillar swelling or exudate.  Neck: No stridor.  No cervical spine tenderness to palpation. Hematological/Lymphatic/Immunilogical: No cervical lymphadenopathy. Cardiovascular: Normal rate, regular rhythm. Grossly normal heart sounds.  Good peripheral circulation. Respiratory: Normal respiratory effort.  No retractions. Lungs CTAB.No wheezes, rales or rhonchi. Good air movement.  Gastrointestinal: Soft and nontender.  Musculoskeletal: Ambulatory with steady gait.  Neurologic:  Normal speech and language. No gross focal neurologic deficits are appreciated. No gait instability. Skin:  Skin is warm, dry and intact. No rash noted. Psychiatric: Mood and affect are normal. Speech and behavior are normal.  ___________________________________________   LABS (all labs ordered are listed, but only abnormal results are displayed)  Labs Reviewed  RAPID INFLUENZA A&B ANTIGENS ([place])    PROCEDURES Procedures    INITIAL IMPRESSION / ASSESSMENT AND PLAN / ED COURSE  Pertinent labs & imaging results that were available during my care of the patient were reviewed by me and considered in my medical decision making (see chart for details).  Very well-appearing patient. No acute distress. Presents for the complaints of 2 days of runny nose, nasal congestion and cough. Patient expresses multiple others at home with similar symptoms. Patient's reports concern over pneumonia. Patient lungs clear throughout. Discussed in detail with  patient suspect viral upper respiratory infection. Discussed in detail with patient and offered to perform chest x-ray to exclude pneumonia, patient declines. Discussed and reiterated importance of appropriate antibiotic use, and discussed the patient suspect viral respiratory infection with no clear indication of antibiotic at this time. Discussed with patient supportive treatments as well as supportive medication use. Continue with home Sudafed as patient tolerates well per patietn, as well as discussed prescriptions for oral Tessalon Perles as well as Tussionex. In discussing with patient for the prescription for Tussionex stated that I will be looking the patient up in the [facility] control database report.  Patient states at that time she reports recent pain medication prescriptions but states at this time she does not have any current pain medicine, as she has ran out.  According to the [facility] control database report patient received on 04/01/2016 clonazepam 0.5 mg quantity #60 tablets 30 day supply, as well as 03/15/2016 hydrocodone-acetaminophen10 mg quantity 150 tablets as a 25 day supply, and 02/22/2016 hydrocodone-acetaminophen 10 mg tablets quantity 150 25 day supply, 02/20/2016 hydromorphone 2 mg tablets quantity #3010 day supply. Discussed in detail with patient based on recent controlled prescription use and as per documented ordered patient should still have pain medication at home, and  Will not prescribe any cough medicine that has controlled substance. Patient stated that she would still like to have this prescription, conversation reiterated importance of not overusing medication as directed as well as safety in regards these medications including risks of accidental overdose. Annie Main RN was at bedside during this conversation.  Again encouraged patient to have supportive care, and prescription for when necessary Tessalon Perles and to patient's pharmacy.  Discussed follow up  with Primary care physician this week. Discussed follow up and return parameters including no resolution or any worsening concerns. Patient verbalized understanding and agreed to plan.   ____________________________________________   FINAL CLINICAL IMPRESSION(S) / ED DIAGNOSES  Final diagnoses:  Upper respiratory tract infection, unspecified type     Discharge Medication List as of 04/06/2016  4:52 PM    START taking these medications   Details  benzonatate (TESSALON PERLES) 100 MG capsule Take 1 capsule (100 mg total) by mouth 3 (three) times daily as needed., Starting Sat 04/06/2016, Normal        Note: This dictation was prepared with Dragon dictation along with smaller phrase technology. Any transcriptional errors that result from this process are unintentional.    Clinical Course      Marylene Land, NP 04/06/16 1824

## 2016-04-06 NOTE — ED Notes (Signed)
Patient became upset when I informed her that tessalon pearls were prescribed and not a norco containing cough syrup. Patient stated that the norco containing cough syrup was the only medication that would help her. Marylene Land NP came into the room and explained to the patient that she would not be able to prescribed the norco containing cough syrup due to a recent prescription of norco. The patient became increasingly upset and tried to talk Sabra Heck NP into prescribing her the cough syrup. Miller NP informed the patient that she legally would not be able to prescribe the Norco containing cough syrup. Patient was discharged.

## 2016-04-06 NOTE — ED Triage Notes (Signed)
Patient started having symptoms of nasal congestion, body aches and cough 2 days ago.

## 2016-04-07 ENCOUNTER — Emergency Department: Payer: 59

## 2016-04-07 ENCOUNTER — Encounter: Payer: Self-pay | Admitting: Emergency Medicine

## 2016-04-07 ENCOUNTER — Emergency Department
Admission: EM | Admit: 2016-04-07 | Discharge: 2016-04-07 | Disposition: A | Discharge status: Home / Self Care | Payer: 59 | Attending: Emergency Medicine | Admitting: Emergency Medicine

## 2016-04-07 DIAGNOSIS — R05 Cough: Secondary | ICD-10-CM | POA: Diagnosis not present

## 2016-04-07 DIAGNOSIS — Z79899 Other long term (current) drug therapy: Secondary | ICD-10-CM | POA: Insufficient documentation

## 2016-04-07 DIAGNOSIS — Z87891 Personal history of nicotine dependence: Secondary | ICD-10-CM | POA: Insufficient documentation

## 2016-04-07 DIAGNOSIS — R0981 Nasal congestion: Secondary | ICD-10-CM | POA: Diagnosis present

## 2016-04-07 DIAGNOSIS — I10 Essential (primary) hypertension: Secondary | ICD-10-CM | POA: Diagnosis not present

## 2016-04-07 DIAGNOSIS — J069 Acute upper respiratory infection, unspecified: Secondary | ICD-10-CM | POA: Insufficient documentation

## 2016-04-07 LAB — BASIC METABOLIC PANEL
Anion gap: 8 (ref 5–15)
BUN: 5 mg/dL — ABNORMAL LOW (ref 6–20)
CALCIUM: 9.3 mg/dL (ref 8.9–10.3)
CO2: 26 mmol/L (ref 22–32)
CREATININE: 0.69 mg/dL (ref 0.44–1.00)
Chloride: 107 mmol/L (ref 101–111)
Glucose, Bld: 99 mg/dL (ref 65–99)
Potassium: 3.5 mmol/L (ref 3.5–5.1)
SODIUM: 141 mmol/L (ref 135–145)

## 2016-04-07 LAB — CBC
HCT: 41.6 % (ref 35.0–47.0)
Hemoglobin: 13.7 g/dL (ref 12.0–16.0)
MCH: 29.3 pg (ref 26.0–34.0)
MCHC: 32.9 g/dL (ref 32.0–36.0)
MCV: 89.1 fL (ref 80.0–100.0)
PLATELETS: 264 10*3/uL (ref 150–440)
RBC: 4.67 MIL/uL (ref 3.80–5.20)
RDW: 12.9 % (ref 11.5–14.5)
WBC: 5.9 10*3/uL (ref 3.6–11.0)

## 2016-04-07 LAB — TROPONIN I: Troponin I: <0.03 ng/mL (ref <0.03)

## 2016-04-07 MED ORDER — ONDANSETRON HCL 4 MG/2ML IJ SOLN
INTRAMUSCULAR | Status: AC
  Administered 2016-04-07: 4 mg via INTRAVENOUS
  Filled 2016-04-07: qty 2

## 2016-04-07 MED ORDER — ALBUTEROL SULFATE HFA 108 (90 BASE) MCG/ACT IN AERS: 2.0000 | INHALATION_SPRAY | Freq: Four times a day (QID) | RESPIRATORY_TRACT | 0 refills | Status: DC | PRN

## 2016-04-07 MED ORDER — ONDANSETRON HCL 4 MG/2ML IJ SOLN
4.0000 mg | Freq: Once | INTRAMUSCULAR | Status: AC
  Administered 2016-04-07: 4 mg via INTRAVENOUS

## 2016-04-07 MED ORDER — SODIUM CHLORIDE 0.9 % IV BOLUS (SEPSIS)
1000.0000 mL | Freq: Once | INTRAVENOUS | Status: AC
  Administered 2016-04-07: 1000 mL via INTRAVENOUS

## 2016-04-07 NOTE — ED Provider Notes (Signed)
[facility] Emergency Department Provider Note    ____________________________________________   I have reviewed the triage vital signs and the nursing notes.   HISTORY  Chief Complaint Chest Pain and Dehydration   History limited by: Not Limited   HPI Misty Zamora is a 40 y.o. female who presents to the emergency department today because of concern for continued cough and congestion and concern for possible pneumonia. The patient was seen at urgent care yesterday and was diagnosed with an upper respiratory viral infection. She was given tessalon Perles. The patient states that they have not given her significant relief. She does feel similar to when she had a pneumonia last year. The patient called her doctor's office today and was advised to be seen in 24 hours. The patient felt like she had a low grade fever this morning.   Past Medical History:  Diagnosis Date  . Anemia    had iron infusions. last one in Sept. told levels are now normal  . Anxiety   . 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 Bulging lumbar disc    causes bilateral leg pain  . Depression   . GERD (gastroesophageal reflux disease)    occasional  . Heart murmur 2005  . History of cardiovascular stress test 2005   showed MR and TR  . History of kidney stones   . Hypertension    no meds since gastric bypass  . IDA (iron deficiency anemia)   . PONV (postoperative nausea and vomiting)    hypotension with epidural  . Sleep apnea    was better after gastric bypass, hasd recently started snoring again.  . Wears contact lenses     Patient Active Problem List   Diagnosis Date Noted  . Adiposity 12/21/2015  . H/O sinus tachycardia 12/21/2015  . DDD (degenerative disc disease), lumbar 10/03/2015  . Neuritis or radiculitis due to rupture of lumbar intervertebral disc 06/07/2015  . 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 Other intervertebral disc displacement,  lumbar region 06/07/2015  . Obesity 04/07/2015  . Allergic rhinitis 02/18/2015  . Anxiety 02/18/2015  . H/O renal calculi 02/18/2015  . 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 Multinodular goiter 01/02/2015  . Status post laparoscopic hysterectomy 12/27/2014  . Chronic female pelvic pain 12/08/2014  . IDA (iron deficiency anemia) 12/01/2014  . Abdominal pain, chronic, epigastric 01/29/2012    Past Surgical History:  Procedure Laterality Date  . ABDOMINAL HYSTERECTOMY  12/2014   ovaries in situ  . BILATERAL SALPINGECTOMY Bilateral 12/27/2014   Procedure: BILATERAL SALPINGECTOMY;  Surgeon: Will Bonnet, MD;  Location: [facility];  Service: Gynecology;  Laterality: Bilateral;  . COLONOSCOPY  2010  . CYSTOSCOPY N/A 12/27/2014   Procedure: CYSTOSCOPY;  Surgeon: Will Bonnet, MD;  Location: [facility];  Service: Gynecology;  Laterality: N/A;  . DILATION AND CURETTAGE OF UTERUS    . ELBOW ARTHROSCOPY Left 03/30/2015   Procedure: ARTHROSCOPY ELBOW WITH DEBRIDEMENT OF THE SYMPTOMATIC PLICA;  Surgeon: Corky Mull, MD;  Location: [place];  Service: Orthopedics;  Laterality: Left;  . ELBOW SURGERY Left   . ERCP    . gallbladder removed    . GASTRIC BYPASS  2012  . LAPAROSCOPIC HYSTERECTOMY N/A 12/27/2014   Procedure: HYSTERECTOMY TOTAL LAPAROSCOPIC;  Surgeon: Will Bonnet, MD;  Location: [facility];  Service: Gynecology;  Laterality: N/A;  . laproscopy    . RENAL ENDOSCOPY VIA NEPHROSTOMY / PYELOSTOMY  2010  . TUBAL  LIGATION      Prior to Admission medications   Medication Sig Start Date End Date Taking? Authorizing Provider  benzonatate (TESSALON PERLES) 100 MG capsule Take 1 capsule (100 mg total) by mouth 3 (three) times daily as needed. 04/06/16   Marylene Land, NP  clonazePAM (KLONOPIN) 0.5 MG tablet TAKE 1 TABLET BY MOUTH TWICE A DAY AS NEEDED FOR ANXIETY 12/27/15   Carmon Ginsberg, PA  DULoxetine (CYMBALTA) 60 MG capsule Take one daily 11/23/15   Carmon Ginsberg, PA   erythromycin ophthalmic ointment Place 1 application into both eyes 4 (four) times daily. 03/28/16   Carmon Ginsberg, PA  furosemide (LASIX) 20 MG tablet Take 1 tablet (20 mg total) by mouth daily as needed for edema. 10/02/15   Erma Heritage, PA  furosemide (LASIX) 20 MG tablet Take by mouth. 10/02/15   Historical Provider, MD  HYDROcodone-acetaminophen [facility]) 10-325 MG tablet Limit 1 tablet by mouth 3-6 times per day if tolerated 03/13/16   Mar Daring, PA-C  HYDROcodone-acetaminophen [facility]) 10-325 MG tablet Take by mouth. 01/03/16   Historical Provider, MD  sulfacetamide (BLEPH-10) 10 % ophthalmic solution Place 2 drops into both eyes 4 (four) times daily. 03/19/16   Carmon Ginsberg, PA    Allergies Compazine [prochlorperazine edisylate] and Ketorolac  Family History  Problem Relation Age of Onset  . Hypertension Mother   . Hyperlipidemia Mother   . Diabetes Mother   . Cancer Mother     breast cancer; 04/2011  . Colon polyps Mother   . Breast cancer Mother 20  . Hyperlipidemia Father   . Hypertension Father   . Colon polyps Father   . Hypertension Sister   . Hypertension Brother   . Cancer Maternal Uncle     terminal lung cancer  . Breast cancer Maternal Aunt   . Breast cancer Paternal Aunt   . Breast cancer Paternal Grandmother   . Bladder Cancer Neg Hx   . Kidney cancer Neg Hx     Social History Social History  Substance Use Topics  . Smoking status: Former Smoker    Packs/day: 0.00    Years: 8.00    Types: Cigarettes    Quit date: 06/04/1999  . Smokeless tobacco: Never Used  . Alcohol use No     Comment: 1 drink every few months    Review of Systems  Constitutional: Positive for low grade fever. Cardiovascular: Negative for chest pain. Respiratory: Positive for shortness of breath. Positive for congestion. Gastrointestinal: Negative for abdominal pain, vomiting and diarrhea. Neurological: Negative for headaches, focal weakness or  numbness.  10-point ROS otherwise negative.  ____________________________________________   PHYSICAL EXAM:  VITAL SIGNS: ED Triage Vitals  Enc Vitals Group     BP 04/07/16 1325 129/72     Pulse Rate 04/07/16 1325 (!) 114     Resp 04/07/16 1325 20     Temp 04/07/16 1325 98.8 F (37.1 C)     Temp Source 04/07/16 1325 Oral     SpO2 04/07/16 1325 100 %     Weight 04/07/16 1326 216 lb (98 kg)     Height 04/07/16 1326 5\' 9"  (1.753 m)     Head Circumference --      Peak Flow --      Pain Score 04/07/16 1330 2   Constitutional: Alert and oriented. Well appearing and in no distress. Eyes: Conjunctivae are normal. Normal extraocular movements. ENT   Head: Normocephalic and atraumatic.   Nose: No congestion/rhinnorhea.   Mouth/Throat:  Mucous membranes are moist.   Neck: No stridor. Hematological/Lymphatic/Immunilogical: No cervical lymphadenopathy. Cardiovascular: Normal rate, regular rhythm.  No murmurs, rubs, or gallops.  Respiratory: Normal respiratory effort without tachypnea nor retractions. Breath sounds are clear and equal bilaterally. No wheezes/rales/rhonchi. Prolonged expiratory phase.  Gastrointestinal: Soft and nontender. No distention.  Genitourinary: Deferred Musculoskeletal: Normal range of motion in all extremities. No lower extremity edema. Neurologic:  Normal speech and language. No gross focal neurologic deficits are appreciated.  Skin:  Skin is warm, dry and intact. No rash noted. Psychiatric: Mood and affect are normal. Speech and behavior are normal. Patient exhibits appropriate insight and judgment.  ____________________________________________    LABS (pertinent positives/negatives)  Labs Reviewed  BASIC METABOLIC PANEL - Abnormal; Notable for the following:       Result Value   BUN 5 (*)    All other components within normal limits  CBC  TROPONIN I     ____________________________________________   EKG  I, Nance Pear,  attending physician, personally viewed and interpreted this EKG  EKG Time: 1342 Rate: 95 Rhythm: normal sinus rhythm Axis: normal Intervals: qtc 434 QRS: q waves V2 ST changes: no st elevation Impression: abnormal ekg   ____________________________________________    RADIOLOGY  CXR   IMPRESSION:  No active cardiopulmonary disease.     I, Leanthony Rhett, personally viewed and evaluated these images (plain radiographs) as part of my medical decision making. ____________________________________________   PROCEDURES  Procedures  ____________________________________________   INITIAL IMPRESSION / ASSESSMENT AND PLAN / ED COURSE  Pertinent labs & imaging results that were available during my care of the patient were reviewed by me and considered in my medical decision making (see chart for details).  Patient's workup without concerning findings. Patient given IV fluids here. Think likely viral URI. Will give albuterol inhaler to help symptoms. ____________________________________________   FINAL CLINICAL IMPRESSION(S) / ED DIAGNOSES  Final diagnoses:  Upper respiratory tract infection, unspecified type     Note: This dictation was prepared with Dragon dictation. Any transcriptional errors that result from this process are unintentional    Nance Pear, MD 04/07/16 [phone]

## 2016-04-07 NOTE — Discharge Instructions (Signed)
Please seek medical attention for any high fevers, chest pain, shortness of breath, change in behavior, persistent vomiting, bloody stool or any other new or concerning symptoms.  

## 2016-04-07 NOTE — ED Triage Notes (Signed)
Pt c/o cough s/sx, chest pain, nausea and diarrhea x3 days. Seen at [place] yesterday and was given tessalon pearls. Sent at [facility] today and was sent to ED for HR 130s. Pt states symptoms feel similar to PNA that she had last year. Cough productive of clear sputum.

## 2016-04-07 NOTE — ED Notes (Signed)
Consulted Dr. Mariea Clonts. Orders received to start PIV, given 1L NS bolus and 4mg  Zofran IV.

## 2016-04-07 NOTE — ED Notes (Signed)
Xray complete

## 2016-04-07 NOTE — ED Notes (Signed)
x4 episodes emesis in last 24hrs and 10 episodes diarrhea.

## 2016-04-08 ENCOUNTER — Other Ambulatory Visit: Payer: Self-pay | Admitting: Physician Assistant

## 2016-04-08 ENCOUNTER — Other Ambulatory Visit: Payer: Self-pay | Admitting: Family Medicine

## 2016-04-08 ENCOUNTER — Encounter: Payer: Self-pay | Admitting: Family Medicine

## 2016-04-08 DIAGNOSIS — G8929 Other chronic pain: Secondary | ICD-10-CM

## 2016-04-08 DIAGNOSIS — M545 Low back pain: Principal | ICD-10-CM

## 2016-04-08 MED ORDER — HYDROCODONE-ACETAMINOPHEN 10-325 MG PO TABS: ORAL_TABLET | ORAL | 0 refills | Status: DC

## 2016-04-15 ENCOUNTER — Ambulatory Visit: Payer: 59

## 2016-04-22 ENCOUNTER — Other Ambulatory Visit: Payer: Self-pay | Admitting: Family Medicine

## 2016-04-22 ENCOUNTER — Other Ambulatory Visit: Payer: Self-pay

## 2016-04-22 ENCOUNTER — Telehealth: Payer: Self-pay | Admitting: Cardiovascular Disease

## 2016-04-22 ENCOUNTER — Encounter: Payer: Self-pay | Admitting: Family Medicine

## 2016-04-22 ENCOUNTER — Other Ambulatory Visit: Payer: Self-pay | Admitting: Student

## 2016-04-22 DIAGNOSIS — R002 Palpitations: Secondary | ICD-10-CM

## 2016-04-22 MED ORDER — FUROSEMIDE 20 MG PO TABS: 20.0000 mg | ORAL_TABLET | Freq: Every day | ORAL | 3 refills | Status: DC | PRN

## 2016-04-22 NOTE — Telephone Encounter (Signed)
BMET scheduled for the patient per last phone note since being on Lasix.

## 2016-04-24 ENCOUNTER — Other Ambulatory Visit: Payer: 59

## 2016-04-30 ENCOUNTER — Other Ambulatory Visit: Payer: Self-pay | Admitting: Family Medicine

## 2016-04-30 ENCOUNTER — Encounter: Payer: Self-pay | Admitting: Family Medicine

## 2016-04-30 DIAGNOSIS — M545 Low back pain: Principal | ICD-10-CM

## 2016-04-30 DIAGNOSIS — G8929 Other chronic pain: Secondary | ICD-10-CM

## 2016-04-30 NOTE — Telephone Encounter (Signed)
Can you please review this since Mikki Santee is out of the office today? The rx for Norco would only last 25 days if she is taking the maximum dose prescribed. Please advise. Renaldo Fiddler, CMA

## 2016-05-01 ENCOUNTER — Other Ambulatory Visit: Payer: Self-pay | Admitting: Family Medicine

## 2016-05-01 DIAGNOSIS — M545 Low back pain: Principal | ICD-10-CM

## 2016-05-01 DIAGNOSIS — G8929 Other chronic pain: Secondary | ICD-10-CM

## 2016-05-01 MED ORDER — HYDROCODONE-ACETAMINOPHEN 10-325 MG PO TABS: ORAL_TABLET | ORAL | 0 refills | Status: DC

## 2016-05-07 ENCOUNTER — Other Ambulatory Visit: Payer: Self-pay | Admitting: Family Medicine

## 2016-05-07 ENCOUNTER — Encounter: Payer: Self-pay | Admitting: Family Medicine

## 2016-05-07 DIAGNOSIS — G8929 Other chronic pain: Secondary | ICD-10-CM

## 2016-05-07 DIAGNOSIS — M545 Low back pain: Principal | ICD-10-CM

## 2016-05-07 MED ORDER — HYDROCODONE-ACETAMINOPHEN 10-325 MG PO TABS: ORAL_TABLET | ORAL | 0 refills | Status: DC

## 2016-05-08 NOTE — Telephone Encounter (Signed)
Patient prescription was approved and printed and was picked up by patient on 05/07/16.KW

## 2016-05-12 ENCOUNTER — Emergency Department
Admission: EM | Admit: 2016-05-12 | Discharge: 2016-05-12 | Disposition: A | Discharge status: Home / Self Care | Payer: 59 | Attending: Emergency Medicine | Admitting: Emergency Medicine

## 2016-05-12 ENCOUNTER — Encounter: Payer: Self-pay | Admitting: Emergency Medicine

## 2016-05-12 ENCOUNTER — Emergency Department: Payer: 59

## 2016-05-12 DIAGNOSIS — R002 Palpitations: Secondary | ICD-10-CM

## 2016-05-12 DIAGNOSIS — F419 Anxiety disorder, unspecified: Secondary | ICD-10-CM | POA: Insufficient documentation

## 2016-05-12 DIAGNOSIS — R079 Chest pain, unspecified: Secondary | ICD-10-CM | POA: Diagnosis not present

## 2016-05-12 DIAGNOSIS — Z87891 Personal history of nicotine dependence: Secondary | ICD-10-CM | POA: Insufficient documentation

## 2016-05-12 DIAGNOSIS — R531 Weakness: Secondary | ICD-10-CM

## 2016-05-12 DIAGNOSIS — I10 Essential (primary) hypertension: Secondary | ICD-10-CM | POA: Diagnosis not present

## 2016-05-12 DIAGNOSIS — Z79899 Other long term (current) drug therapy: Secondary | ICD-10-CM | POA: Diagnosis not present

## 2016-05-12 LAB — URINALYSIS, COMPLETE (UACMP) WITH MICROSCOPIC
BILIRUBIN URINE: NEGATIVE
Bacteria, UA: NONE SEEN
Glucose, UA: NEGATIVE mg/dL
HGB URINE DIPSTICK: NEGATIVE
Ketones, ur: NEGATIVE mg/dL
Leukocytes, UA: NEGATIVE
NITRITE: NEGATIVE
Protein, ur: NEGATIVE mg/dL
SPECIFIC GRAVITY, URINE: 1.008 (ref 1.005–1.030)
WBC UA: NONE SEEN WBC/hpf (ref 0–5)
pH: 6 (ref 5.0–8.0)

## 2016-05-12 LAB — CBC
HEMATOCRIT: 38 % (ref 35.0–47.0)
HEMOGLOBIN: 12.9 g/dL (ref 12.0–16.0)
MCH: 29.9 pg (ref 26.0–34.0)
MCHC: 34 g/dL (ref 32.0–36.0)
MCV: 88 fL (ref 80.0–100.0)
Platelets: 250 10*3/uL (ref 150–440)
RBC: 4.32 MIL/uL (ref 3.80–5.20)
RDW: 13.4 % (ref 11.5–14.5)
WBC: 6.5 10*3/uL (ref 3.6–11.0)

## 2016-05-12 LAB — BASIC METABOLIC PANEL
ANION GAP: 5 (ref 5–15)
BUN: 8 mg/dL (ref 6–20)
CALCIUM: 9.3 mg/dL (ref 8.9–10.3)
CO2: 29 mmol/L (ref 22–32)
Chloride: 103 mmol/L (ref 101–111)
Creatinine, Ser: 0.78 mg/dL (ref 0.44–1.00)
GFR calc Af Amer: >60 mL/min (ref >60)
Glucose, Bld: 85 mg/dL (ref 65–99)
POTASSIUM: 4.1 mmol/L (ref 3.5–5.1)
SODIUM: 137 mmol/L (ref 135–145)

## 2016-05-12 LAB — TSH: TSH: 4.5 u[IU]/mL (ref 0.350–4.500)

## 2016-05-12 LAB — TROPONIN I

## 2016-05-12 NOTE — ED Notes (Addendum)
Urine sent

## 2016-05-12 NOTE — ED Notes (Signed)
Pt to front desk reporting that her nurse had only seen her once and that was to get urine so pt decided to leave. Pt made aware that she was up for discharge and asked if pt wanted to go back to room for instructions, pt declined.

## 2016-05-12 NOTE — ED Notes (Signed)
Nurse Theadora Rama went to discharge pt but pt not in room.

## 2016-05-12 NOTE — ED Notes (Signed)
Pt requesting nurse - dr will print her discharge instructions. Called to have another

## 2016-05-12 NOTE — ED Triage Notes (Addendum)
C/O thirst, mouth being dry, drinking a lot of water, itchy skin x 3 days.  Also c/o intermittent palpitations and sob.   Patient takes lasix daily.

## 2016-05-12 NOTE — ED Provider Notes (Addendum)
[facility] Emergency Department Provider Note        Time seen: ----------------------------------------- 4:15 PM on 05/12/2016 -----------------------------------------    I have reviewed the triage vital signs and the nursing notes.   HISTORY  Chief Complaint dry mouth and Polydipsia    HPI Misty Zamora is a 40 y.o. female who presents to the ER with multiple complaints. Patient complains of thirst, dry mouth, polydipsia, and pruritus for the last 3 days. She also complains of intermittent palpitations and shortness of breath. Patient states her heart rate has been elevated at home to the 140s. She takes Lasix daily for peripheral edema. Nothing makes her symptoms better or worse.   Past Medical History:  Diagnosis Date  . Anemia    had iron infusions. last one in Sept. told levels are now normal  . Anxiety   . 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 Bulging lumbar disc    causes bilateral leg pain  . Depression   . GERD (gastroesophageal reflux disease)    occasional  . Heart murmur 2005  . History of cardiovascular stress test 2005   showed MR and TR  . History of kidney stones   . Hypertension    no meds since gastric bypass  . IDA (iron deficiency anemia)   . PONV (postoperative nausea and vomiting)    hypotension with epidural  . Sleep apnea    was better after gastric bypass, hasd recently started snoring again.  . Wears contact lenses     Patient Active Problem List   Diagnosis Date Noted  . Adiposity 12/21/2015  . H/O sinus tachycardia 12/21/2015  . DDD (degenerative disc disease), lumbar 10/03/2015  . Neuritis or radiculitis due to rupture of lumbar intervertebral disc 06/07/2015  . 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 Other intervertebral disc displacement, lumbar region 06/07/2015  . Obesity 04/07/2015  . Allergic rhinitis 02/18/2015  . Anxiety 02/18/2015  . H/O renal calculi 02/18/2015  .  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 Multinodular goiter 01/02/2015  . Status post laparoscopic hysterectomy 12/27/2014  . Chronic female pelvic pain 12/08/2014  . IDA (iron deficiency anemia) 12/01/2014  . Abdominal pain, chronic, epigastric 01/29/2012    Past Surgical History:  Procedure Laterality Date  . ABDOMINAL HYSTERECTOMY  12/2014   ovaries in situ  . BILATERAL SALPINGECTOMY Bilateral 12/27/2014   Procedure: BILATERAL SALPINGECTOMY;  Surgeon: Will Bonnet, MD;  Location: [facility];  Service: Gynecology;  Laterality: Bilateral;  . COLONOSCOPY  2010  . CYSTOSCOPY N/A 12/27/2014   Procedure: CYSTOSCOPY;  Surgeon: Will Bonnet, MD;  Location: [facility];  Service: Gynecology;  Laterality: N/A;  . DILATION AND CURETTAGE OF UTERUS    . ELBOW ARTHROSCOPY Left 03/30/2015   Procedure: ARTHROSCOPY ELBOW WITH DEBRIDEMENT OF THE SYMPTOMATIC PLICA;  Surgeon: Corky Mull, MD;  Location: [place];  Service: Orthopedics;  Laterality: Left;  . ELBOW SURGERY Left   . ERCP    . gallbladder removed    . GASTRIC BYPASS  2012  . LAPAROSCOPIC HYSTERECTOMY N/A 12/27/2014   Procedure: HYSTERECTOMY TOTAL LAPAROSCOPIC;  Surgeon: Will Bonnet, MD;  Location: [facility];  Service: Gynecology;  Laterality: N/A;  . laproscopy    . RENAL ENDOSCOPY VIA NEPHROSTOMY / PYELOSTOMY  2010  . TUBAL LIGATION      Allergies Compazine [prochlorperazine edisylate] and Ketorolac  Social History Social History  Substance Use Topics  . Smoking status: Former Smoker  Packs/day: 0.00    Years: 8.00    Types: Cigarettes    Quit date: 06/04/1999  . Smokeless tobacco: Never Used  . Alcohol use No     Comment: 1 drink every few months    Review of Systems Constitutional: Negative for fever. ENT: Positive for dry mouth, polydipsia Cardiovascular: Negative for chest pain. Positive for palpitations Respiratory: Positive for shortness of breath Gastrointestinal: Negative for abdominal pain, vomiting  and diarrhea. Genitourinary: Negative for dysuria. Musculoskeletal: Positive for chronic back pain Skin: Negative for rash. Neurological: Negative for headaches, positive for generalized weakness  10-point ROS otherwise negative.  ____________________________________________   PHYSICAL EXAM:  VITAL SIGNS: ED Triage Vitals  Enc Vitals Group     BP 05/12/16 1447 132/80     Pulse Rate 05/12/16 1447 75     Resp 05/12/16 1447 16     Temp 05/12/16 1447 98.8 F (37.1 C)     Temp Source 05/12/16 1447 Oral     SpO2 05/12/16 1447 100 %     Weight 05/12/16 1445 216 lb (98 kg)     Height 05/12/16 1445 5\' 9"  (1.753 m)     Head Circumference --      Peak Flow --      Pain Score 05/12/16 1446 7     Pain Loc --      Pain Edu? --      Excl. in [place]? --     Constitutional: Alert and oriented. Well appearing and in no distress. Eyes: Conjunctivae are normal. PERRL. Normal extraocular movements. ENT   Head: Normocephalic and atraumatic.   Nose: No congestion/rhinnorhea.   Mouth/Throat: Mucous membranes are moist.   Neck: No stridor. Cardiovascular: Normal rate, regular rhythm. No murmurs, rubs, or gallops. Respiratory: Normal respiratory effort without tachypnea nor retractions. Breath sounds are clear and equal bilaterally. No wheezes/rales/rhonchi. Gastrointestinal: Soft and nontender. Normal bowel sounds Musculoskeletal: Nontender with normal range of motion in all extremities. No lower extremity tenderness nor edema. Neurologic:  Normal speech and language. No gross focal neurologic deficits are appreciated.  Skin:  Skin is warm, dry and intact. No rash noted. Psychiatric: Depressed mood and affect ____________________________________________  EKG: Interpreted by me. Sinus rhythm with a rate of 65 bpm, normal PR interval, normal QRS size, normal QT, normal axis.  ____________________________________________  ED COURSE:  Pertinent labs & imaging results that were  available during my care of the patient were reviewed by me and considered in my medical decision making (see chart for details). Clinical Course   Patient resents to the ER for multiple complaints which are likely anxiety or depression related. We will assess with labs and reevaluate.  Procedures ____________________________________________   LABS (pertinent positives/negatives)  Labs Reviewed  URINALYSIS, COMPLETE (UACMP) WITH MICROSCOPIC - Abnormal; Notable for the following:       Result Value   Color, Urine STRAW (*)    APPearance CLEAR (*)    Squamous Epithelial / LPF 0-5 (*)    All other components within normal limits  BASIC METABOLIC PANEL  CBC  TROPONIN I  TSH    RADIOLOGY  Chest x-ray is unremarkable  ____________________________________________  FINAL ASSESSMENT AND PLAN  Weakness, anxiety  Plan: Patient with labs and imaging as dictated above. Patient resents to the ER no distress. I have advised her stopping her Lasix and restarting propranolol that she can take as needed for tachycardia. Otherwise I believe her symptoms are likely anxiety and chronic pain related. She may require additional  Klonopin to take as needed. She is stable for outpatient follow-up.   Earleen Newport, MD   Note: This dictation was prepared with Dragon dictation. Any transcriptional errors that result from this process are unintentional    Earleen Newport, MD 05/12/16 1702    Earleen Newport, MD 05/12/16 [phone]

## 2016-05-12 NOTE — ED Notes (Addendum)
Pt given urine cup and walked to the bathroom for sample. Pt in for polydipsia and dry mouth. Labs were obtained in triage and are wnl.

## 2016-05-13 ENCOUNTER — Telehealth: Payer: Self-pay

## 2016-05-13 ENCOUNTER — Other Ambulatory Visit: Payer: Self-pay | Admitting: Family Medicine

## 2016-05-13 DIAGNOSIS — R112 Nausea with vomiting, unspecified: Secondary | ICD-10-CM

## 2016-05-13 MED ORDER — PROMETHAZINE HCL 25 MG PO TABS: 25.0000 mg | ORAL_TABLET | Freq: Four times a day (QID) | ORAL | 1 refills | Status: DC | PRN

## 2016-05-13 NOTE — Telephone Encounter (Signed)
Promethazine sent in for nausea

## 2016-05-13 NOTE — Telephone Encounter (Signed)
Patient calls office stating that she has been feeling "ill" these past few days. Patient reports that she was seen at [facility] yesterday 05/12/16 with complaints of elevated heart rate of 150. Patient reports at clinic heart rate went back to normal but was still sent over to [facility] ER for observation. Patient states that today she has been having hot flashes and her blood pressure was 100/60, patient complains of dizziness nausea and vomiting. Patient is requesting that Rx be sent to [facility] on [address] for nausea. KW

## 2016-05-14 ENCOUNTER — Emergency Department
Admission: EM | Admit: 2016-05-14 | Discharge: 2016-05-14 | Disposition: A | Discharge status: Home / Self Care | Payer: 59 | Attending: Emergency Medicine | Admitting: Emergency Medicine

## 2016-05-14 ENCOUNTER — Encounter: Payer: Self-pay | Admitting: Family Medicine

## 2016-05-14 ENCOUNTER — Emergency Department: Payer: 59

## 2016-05-14 ENCOUNTER — Other Ambulatory Visit: Payer: Self-pay | Admitting: Family Medicine

## 2016-05-14 ENCOUNTER — Encounter: Payer: Self-pay | Admitting: Radiology

## 2016-05-14 DIAGNOSIS — I10 Essential (primary) hypertension: Secondary | ICD-10-CM | POA: Insufficient documentation

## 2016-05-14 DIAGNOSIS — E042 Nontoxic multinodular goiter: Secondary | ICD-10-CM

## 2016-05-14 DIAGNOSIS — R079 Chest pain, unspecified: Secondary | ICD-10-CM | POA: Diagnosis not present

## 2016-05-14 DIAGNOSIS — R1013 Epigastric pain: Secondary | ICD-10-CM | POA: Diagnosis not present

## 2016-05-14 DIAGNOSIS — R42 Dizziness and giddiness: Secondary | ICD-10-CM | POA: Insufficient documentation

## 2016-05-14 DIAGNOSIS — Z79899 Other long term (current) drug therapy: Secondary | ICD-10-CM | POA: Diagnosis not present

## 2016-05-14 DIAGNOSIS — Z87891 Personal history of nicotine dependence: Secondary | ICD-10-CM | POA: Insufficient documentation

## 2016-05-14 DIAGNOSIS — R110 Nausea: Secondary | ICD-10-CM | POA: Diagnosis not present

## 2016-05-14 LAB — CBC
HEMATOCRIT: 40.6 % (ref 35.0–47.0)
Hemoglobin: 13.9 g/dL (ref 12.0–16.0)
MCH: 30.1 pg (ref 26.0–34.0)
MCHC: 34.4 g/dL (ref 32.0–36.0)
MCV: 87.6 fL (ref 80.0–100.0)
Platelets: 266 10*3/uL (ref 150–440)
RBC: 4.63 MIL/uL (ref 3.80–5.20)
RDW: 13.1 % (ref 11.5–14.5)
WBC: 7.5 10*3/uL (ref 3.6–11.0)

## 2016-05-14 LAB — BASIC METABOLIC PANEL
Anion gap: 5 (ref 5–15)
BUN: 7 mg/dL (ref 6–20)
CHLORIDE: 101 mmol/L (ref 101–111)
CO2: 31 mmol/L (ref 22–32)
Calcium: 9.5 mg/dL (ref 8.9–10.3)
Creatinine, Ser: 0.77 mg/dL (ref 0.44–1.00)
GFR calc non Af Amer: >60 mL/min (ref >60)
Glucose, Bld: 86 mg/dL (ref 65–99)
POTASSIUM: 3.9 mmol/L (ref 3.5–5.1)
SODIUM: 137 mmol/L (ref 135–145)

## 2016-05-14 LAB — URINALYSIS, COMPLETE (UACMP) WITH MICROSCOPIC
Bilirubin Urine: NEGATIVE
Glucose, UA: NEGATIVE mg/dL
HGB URINE DIPSTICK: NEGATIVE
Ketones, ur: NEGATIVE mg/dL
Nitrite: NEGATIVE
PROTEIN: NEGATIVE mg/dL
Specific Gravity, Urine: 1.01 (ref 1.005–1.030)
pH: 8 (ref 5.0–8.0)

## 2016-05-14 LAB — HEPATIC FUNCTION PANEL
ALBUMIN: 4.3 g/dL (ref 3.5–5.0)
ALK PHOS: 49 U/L (ref 38–126)
ALT: 43 U/L (ref 14–54)
AST: 66 U/L — AB (ref 15–41)
BILIRUBIN TOTAL: 0.4 mg/dL (ref 0.3–1.2)
Bilirubin, Direct: <0.1 mg/dL — ABNORMAL LOW (ref 0.1–0.5)
Total Protein: 7.4 g/dL (ref 6.5–8.1)

## 2016-05-14 LAB — TROPONIN I: Troponin I: <0.03 ng/mL (ref <0.03)

## 2016-05-14 LAB — LIPASE, BLOOD: Lipase: 23 U/L (ref 11–51)

## 2016-05-14 MED ORDER — IOPAMIDOL (ISOVUE-300) INJECTION 61%
100.0000 mL | Freq: Once | INTRAVENOUS | Status: AC | PRN
  Administered 2016-05-14: 100 mL via INTRAVENOUS

## 2016-05-14 MED ORDER — SODIUM CHLORIDE 0.9 % IV BOLUS (SEPSIS)
1000.0000 mL | Freq: Once | INTRAVENOUS | Status: AC
  Administered 2016-05-14: 1000 mL via INTRAVENOUS

## 2016-05-14 MED ORDER — GI COCKTAIL ~~LOC~~
30.0000 mL | Freq: Once | ORAL | Status: AC
  Administered 2016-05-14: 30 mL via ORAL
  Filled 2016-05-14: qty 30

## 2016-05-14 MED ORDER — ONDANSETRON HCL 4 MG/2ML IJ SOLN
4.0000 mg | Freq: Once | INTRAMUSCULAR | Status: AC
  Administered 2016-05-14: 4 mg via INTRAVENOUS
  Filled 2016-05-14: qty 2

## 2016-05-14 MED ORDER — ALUM & MAG HYDROXIDE-SIMETH 400-400-40 MG/5ML PO SUSP: 5.0000 mL | Freq: Four times a day (QID) | ORAL | 0 refills | Status: DC | PRN

## 2016-05-14 MED ORDER — MORPHINE SULFATE (PF) 4 MG/ML IV SOLN
INTRAVENOUS | Status: AC
  Filled 2016-05-14: qty 1

## 2016-05-14 MED ORDER — MORPHINE SULFATE (PF) 4 MG/ML IV SOLN
4.0000 mg | Freq: Once | INTRAVENOUS | Status: AC
  Administered 2016-05-14: 4 mg via INTRAVENOUS
  Filled 2016-05-14: qty 1

## 2016-05-14 MED ORDER — MORPHINE SULFATE (PF) 4 MG/ML IV SOLN
4.0000 mg | Freq: Once | INTRAVENOUS | Status: AC
  Administered 2016-05-14: 4 mg via INTRAVENOUS

## 2016-05-14 MED ORDER — FAMOTIDINE IN NACL 20-0.9 MG/50ML-% IV SOLN
20.0000 mg | Freq: Once | INTRAVENOUS | Status: AC
  Administered 2016-05-14: 20 mg via INTRAVENOUS
  Filled 2016-05-14: qty 50

## 2016-05-14 MED ORDER — OMEPRAZOLE 40 MG PO CPDR: 40.0000 mg | DELAYED_RELEASE_CAPSULE | Freq: Every day | ORAL | 1 refills | Status: DC

## 2016-05-14 MED ORDER — ONDANSETRON 4 MG PO TBDP
4.0000 mg | ORAL_TABLET | Freq: Once | ORAL | Status: AC | PRN
  Administered 2016-05-14: 4 mg via ORAL
  Filled 2016-05-14: qty 1

## 2016-05-14 MED ORDER — IOPAMIDOL (ISOVUE-300) INJECTION 61%
30.0000 mL | Freq: Once | INTRAVENOUS | Status: AC | PRN
  Administered 2016-05-14: 30 mL via ORAL

## 2016-05-14 NOTE — Discharge Instructions (Signed)
As I explained to you I believe that your abdominal pain is most likely gastritis or an ulcer. On your imaging study there is no evidence of perforation. You should never use NSAIDs such as BC powder, ibuprofen, Motrin, Excedrin, Aleve, naproxen, or any other. Take omeprazole as prescribed every day. Take Maalox as needed for pain. Follow up with your surgeon as soon as you are able to get an appointment. I'm also giving you the phone number of our GI doctor so you may get an appointment for an endoscopy.

## 2016-05-14 NOTE — ED Provider Notes (Signed)
[facility] Emergency Department Provider Note  ____________________________________________  Time seen: Approximately 4:31 PM  I have reviewed the triage vital signs and the nursing notes.   HISTORY  Chief Complaint Chest Pain   HPI Misty Zamora is a 40 y.o. female with a history of gastric bypass, sinus tachycardia, anemia, anxiety who presents for evaluation of epigastric abdominal pain. Patient reports that she was on her lunch break when she developed sudden onset of epigastric abdominal pain that she describes as sharp, radiating to her back, associated with diaphoresis, nausea, lightheadedness. She reports that the pain radiates up to her chest. The pain has been constant since its onset. She continues to be severely nauseated even after receiving Zofran in triage. She reports ever having any similar pain. Patient reports that she has been taking a lot of ibuprofen in the last week for history of migraines and has noticed some black stool yesterday. No prior history of GI bleed. No coffee-ground emesis. Patient has no personal or family history of ischemic heart disease, she is not a smoker, no personal or family history of blood clots, no exogenous hormones, no recent travel immobilization, no leg pain or swelling, no hemoptysis. Patient denies shortness of breath however reports that her epigastric pain is worse when she takes a deep breath.  Past Medical History:  Diagnosis Date  . Anemia    had iron infusions. last one in Sept. told levels are now normal  . Anxiety   . 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 Bulging lumbar disc    causes bilateral leg pain  . Depression   . GERD (gastroesophageal reflux disease)    occasional  . Heart murmur 2005  . History of cardiovascular stress test 2005   showed MR and TR  . History of kidney stones   . Hypertension    no meds since gastric bypass  . IDA (iron deficiency anemia)   . PONV  (postoperative nausea and vomiting)    hypotension with epidural  . Sleep apnea    was better after gastric bypass, hasd recently started snoring again.  . Wears contact lenses     Patient Active Problem List   Diagnosis Date Noted  . Adiposity 12/21/2015  . H/O sinus tachycardia 12/21/2015  . DDD (degenerative disc disease), lumbar 10/03/2015  . Neuritis or radiculitis due to rupture of lumbar intervertebral disc 06/07/2015  . 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 Other intervertebral disc displacement, lumbar region 06/07/2015  . Obesity 04/07/2015  . Allergic rhinitis 02/18/2015  . Anxiety 02/18/2015  . H/O renal calculi 02/18/2015  . 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 Multinodular goiter 01/02/2015  . Status post laparoscopic hysterectomy 12/27/2014  . Chronic female pelvic pain 12/08/2014  . IDA (iron deficiency anemia) 12/01/2014  . Abdominal pain, chronic, epigastric 01/29/2012    Past Surgical History:  Procedure Laterality Date  . ABDOMINAL HYSTERECTOMY  12/2014   ovaries in situ  . BILATERAL SALPINGECTOMY Bilateral 12/27/2014   Procedure: BILATERAL SALPINGECTOMY;  Surgeon: Will Bonnet, MD;  Location: [facility];  Service: Gynecology;  Laterality: Bilateral;  . COLONOSCOPY  2010  . CYSTOSCOPY N/A 12/27/2014   Procedure: CYSTOSCOPY;  Surgeon: Will Bonnet, MD;  Location: [facility];  Service: Gynecology;  Laterality: N/A;  . DILATION AND CURETTAGE OF UTERUS    . ELBOW ARTHROSCOPY Left 03/30/2015   Procedure: ARTHROSCOPY ELBOW WITH DEBRIDEMENT OF THE SYMPTOMATIC PLICA;  Surgeon: Corky Mull, MD;  Location: [place];  Service: Orthopedics;  Laterality: Left;  . ELBOW SURGERY Left   . ERCP    . gallbladder removed    . GASTRIC BYPASS  2012  . LAPAROSCOPIC HYSTERECTOMY N/A 12/27/2014   Procedure: HYSTERECTOMY TOTAL LAPAROSCOPIC;  Surgeon: Will Bonnet, MD;  Location: [facility];  Service: Gynecology;  Laterality: N/A;  . laproscopy    .  RENAL ENDOSCOPY VIA NEPHROSTOMY / PYELOSTOMY  2010  . TUBAL LIGATION      Prior to Admission medications   Medication Sig Start Date End Date Taking? Authorizing Provider  albuterol (PROVENTIL HFA;VENTOLIN HFA) 108 (90 Base) MCG/ACT inhaler Inhale 2 puffs into the lungs every 6 (six) hours as needed for wheezing or shortness of breath. 04/07/16   Nance Pear, MD  alum & mag hydroxide-simeth (MAALOX MAX) 400-400-40 MG/5ML suspension Take 5 mLs by mouth every 6 (six) hours as needed for indigestion. 05/14/16   Rudene Re, MD  benzonatate (TESSALON PERLES) 100 MG capsule Take 1 capsule (100 mg total) by mouth 3 (three) times daily as needed. 04/06/16   Marylene Land, NP  clonazePAM (KLONOPIN) 0.5 MG tablet TAKE 1 TABLET BY MOUTH TWICE A DAY AS NEEDED FOR ANXIETY 12/27/15   Carmon Ginsberg, PA  DULoxetine (CYMBALTA) 60 MG capsule Take one daily 11/23/15   Carmon Ginsberg, PA  erythromycin ophthalmic ointment Place 1 application into both eyes 4 (four) times daily. 03/28/16   Carmon Ginsberg, PA  furosemide (LASIX) 20 MG tablet Take by mouth. 10/02/15   Historical Provider, MD  furosemide (LASIX) 20 MG tablet Take 1 tablet (20 mg total) by mouth daily as needed for edema. 04/22/16   Minna Merritts, MD  HYDROcodone-acetaminophen [facility]) 10-325 MG tablet Limit 1 tablet by mouth 3-6 times per day as tolerated 05/07/16   Carmon Ginsberg, PA  omeprazole (PRILOSEC) 40 MG capsule Take 1 capsule (40 mg total) by mouth daily. 05/14/16 05/14/17  Rudene Re, MD  promethazine (PHENERGAN) 25 MG tablet Take 1 tablet (25 mg total) by mouth every 6 (six) hours as needed for nausea or vomiting. 05/13/16   Carmon Ginsberg, PA  sulfacetamide (BLEPH-10) 10 % ophthalmic solution Place 2 drops into both eyes 4 (four) times daily. 03/19/16   Carmon Ginsberg, PA    Allergies Compazine [prochlorperazine edisylate] and Ketorolac  Family History  Problem Relation Age of Onset  . Hypertension Mother   .  Hyperlipidemia Mother   . Diabetes Mother   . Cancer Mother     breast cancer; 04/2011  . Colon polyps Mother   . Breast cancer Mother 69  . Hyperlipidemia Father   . Hypertension Father   . Colon polyps Father   . Hypertension Sister   . Hypertension Brother   . Cancer Maternal Uncle     terminal lung cancer  . Breast cancer Maternal Aunt   . Breast cancer Paternal Aunt   . Breast cancer Paternal Grandmother   . Bladder Cancer Neg Hx   . Kidney cancer Neg Hx     Social History Social History  Substance Use Topics  . Smoking status: Former Smoker    Packs/day: 0.00    Years: 8.00    Types: Cigarettes    Quit date: 06/04/1999  . Smokeless tobacco: Never Used  . Alcohol use No     Comment: 1 drink every few months    Review of Systems  Constitutional: Negative for fever. + diaphoresis and lightheadedness Eyes: Negative for visual changes. ENT: Negative for  sore throat. Neck: No neck pain  Cardiovascular: Negative for chest pain. Respiratory: Negative for shortness of breath. Gastrointestinal: + epigastric abdominal pain, nausea. No vomiting or diarrhea. Genitourinary: Negative for dysuria. Musculoskeletal: Negative for back pain. Skin: Negative for rash. Neurological: Negative for headaches, weakness or numbness. Psych: No SI or HI  ____________________________________________   PHYSICAL EXAM:  VITAL SIGNS: ED Triage Vitals  Enc Vitals Group     BP 05/14/16 1245 127/86     Pulse Rate 05/14/16 1245 90     Resp 05/14/16 1245 18     Temp 05/14/16 1245 98.6 F (37 C)     Temp Source 05/14/16 1245 Oral     SpO2 05/14/16 1245 100 %     Weight 05/14/16 1241 216 lb (98 kg)     Height 05/14/16 1241 5\' 9"  (1.753 m)     Head Circumference --      Peak Flow --      Pain Score --      Pain Loc --      Pain Edu? --      Excl. in [place]? --     Constitutional: Alert and oriented. Well appearing and in no apparent distress. HEENT:      Head: Normocephalic and  atraumatic.         Eyes: Conjunctivae are normal. Sclera is non-icteric. EOMI. PERRL      Mouth/Throat: Mucous membranes are moist.       Neck: Supple with no signs of meningismus. Cardiovascular: Regular rate and rhythm. No murmurs, gallops, or rubs. 2+ symmetrical distal pulses are present in all extremities. No JVD. Respiratory: Normal respiratory effort. Lungs are clear to auscultation bilaterally. No wheezes, crackles, or rhonchi.  Gastrointestinal: Soft, ttp over the epigastric region, non distended with positive bowel sounds. No rebound or guarding. Genitourinary: No CVA tenderness. Rectal exam showing brown stool Hemoccult negative Musculoskeletal: Nontender with normal range of motion in all extremities. No edema, cyanosis, or erythema of extremities. Neurologic: Normal speech and language. Face is symmetric. Moving all extremities. No gross focal neurologic deficits are appreciated. Skin: Skin is warm, dry and intact. No rash noted. Psychiatric: Mood and affect are normal. Speech and behavior are normal.  ____________________________________________   LABS (all labs ordered are listed, but only abnormal results are displayed)  Labs Reviewed  HEPATIC FUNCTION PANEL - Abnormal; Notable for the following:       Result Value   AST 66 (*)    Bilirubin, Direct <0.1 (*)    All other components within normal limits  URINALYSIS, COMPLETE (UACMP) WITH MICROSCOPIC - Abnormal; Notable for the following:    Color, Urine YELLOW (*)    APPearance CLEAR (*)    Leukocytes, UA MODERATE (*)    Bacteria, UA RARE (*)    Squamous Epithelial / LPF 0-5 (*)    All other components within normal limits  BASIC METABOLIC PANEL  CBC  TROPONIN I  LIPASE, BLOOD  TROPONIN I   ____________________________________________  EKG  ED ECG REPORT I, Rudene Re, the attending physician, personally viewed and interpreted this ECG.  Normal sinus rhythm, rate of 88, normal intervals, normal axis,  no ST elevations or depressions.  ____________________________________________  RADIOLOGY  CXR: Mild chronic bronchitic changes without acute infiltrate  CT a/p: 1. No acute findings in the abdomen or pelvis to account for the patient's symptoms. 2. Status post Roux-en-Y gastric bypass. 3. Normal appendix. ____________________________________________   PROCEDURES  Procedure(s) performed: None Procedures Critical Care performed:  None ____________________________________________   INITIAL IMPRESSION / ASSESSMENT AND PLAN / ED COURSE  40 y.o. female with a history of gastric bypass, sinus tachycardia, anemia, anxiety who presents for evaluation of sudden onset of epigastric abdominal pain associated with diaphoresis, nausea, lightheadedness. Patient's vital signs are within normal limits, patient is in no distress, she has tenderness to palpation in her epigastric region. Patient has been taking a lot of NSAIDs, has had a gastric bypass and has noticed some black tarry stools yesterday which makes me concerned for possible bleeding ulcer however her rectal exam at this time shows no blood. Patient also has had prior history of pancreatitis, lipase is pending at this time. Patient is PERC negative for PE. Heart score is 1 and this presentation does not systolic ACS. Dissection is possible as well however patient has no hypertension, no increased risk factors, normal strong pulses in all 4 extremities, normal mediastinum and chest x-ray therefore consider extremely less likely at this time. We'll give her Zofran, morphine, and fluids. We'll watch patient on telemetry while labs are pending.  Clinical Course as of May 14 2022  Tue May 14, 2016  1951 CT abdomen and pelvis with no acute findings. Blood work and urine are within normal limits. Second troponin is pending. Presentation concerning for possible gastritis versus ulcer especially on this patient was status post gastric bypass. Patient  be discharged home on omeprazole, Maalox, and follow-up with her GI. I have had a long conversation with the patient about not using NSAIDs. And I also instructed her to return to the emergency room if she has coffee-ground emesis or melena.  [CV]    Clinical Course User Index [CV] Rudene Re, MD    Pertinent labs & imaging results that were available during my care of the patient were reviewed by me and considered in my medical decision making (see chart for details).    ____________________________________________   FINAL CLINICAL IMPRESSION(S) / ED DIAGNOSES  Final diagnoses:  Epigastric abdominal pain      NEW MEDICATIONS STARTED DURING THIS VISIT:  New Prescriptions   ALUM & MAG HYDROXIDE-SIMETH (MAALOX MAX) [id number] MG/5ML SUSPENSION    Take 5 mLs by mouth every 6 (six) hours as needed for indigestion.   OMEPRAZOLE (PRILOSEC) 40 MG CAPSULE    Take 1 capsule (40 mg total) by mouth daily.     Note:  This document was prepared using Dragon voice recognition software and may include unintentional dictation errors.    Rudene Re, MD 05/14/16 2023

## 2016-05-14 NOTE — Telephone Encounter (Signed)
Your thyroid lab was okay on 12/10. Are there other anxiety provoking events going on in your life right now especially with work or family?

## 2016-05-14 NOTE — ED Notes (Signed)
Patient is complaining of nausea.

## 2016-05-14 NOTE — ED Triage Notes (Addendum)
Patient presents to the ED with chest pain that began suddenly today while patient was on her lunch break at work.  Patient reports not feeling well earlier today but suddenly having sharp centralized chest pain and palpitations and breaking out into a sweat.  Patient is tearful during triage.  Patient reports she felt like she was going to pass out.  Patient states she recently had blood work that showed a TSH level on the very top level of normal.  Patient reports that she has recently been getting really hot and feeling odd.

## 2016-05-14 NOTE — ED Notes (Signed)
Pt c/o chest pain with dizziness - reports that she was sick Sunday and came to the ER and was discharged and told her thyroid levels were elevated - today she was at work and became faint and felt like she was going to pass out - she began to have chest pain that radiated through to the center of her back and now her pain is in the center of her abd - c/o nausea - Dr Alfred Levins notified

## 2016-05-14 NOTE — Telephone Encounter (Signed)
Patient denied any symptoms of anxiety or stressors currently affecting her. Patient states she is concerned about her thyroid because it its on the higher end of normal. Patient states that if you look at her past TSH reports its has been trending up significantly. Patient would like for you to review and advise because she complains of fatigue, weakness and cold chills. KW

## 2016-05-14 NOTE — Telephone Encounter (Signed)
Patient was notified  She states that she wanted to get lab work to recheck her thyroid level she states that last time it was checked in hospital it was borderline high and she states that she has a history of thyroid problems and wasn't sure if this was contributing to her increased heart rated and cold chills and weakness.

## 2016-05-14 NOTE — Telephone Encounter (Signed)
Labs re-ordered

## 2016-05-15 ENCOUNTER — Telehealth: Payer: Self-pay

## 2016-05-15 ENCOUNTER — Other Ambulatory Visit: Payer: Self-pay | Admitting: Family Medicine

## 2016-05-15 ENCOUNTER — Other Ambulatory Visit
Admission: RE | Admit: 2016-05-15 | Discharge: 2016-05-15 | Disposition: A | Discharge status: Home / Self Care | Payer: 59 | Source: Ambulatory Visit | Attending: Family Medicine | Admitting: Family Medicine

## 2016-05-15 DIAGNOSIS — E041 Nontoxic single thyroid nodule: Secondary | ICD-10-CM

## 2016-05-15 DIAGNOSIS — R1310 Dysphagia, unspecified: Secondary | ICD-10-CM | POA: Diagnosis not present

## 2016-05-15 DIAGNOSIS — R5381 Other malaise: Secondary | ICD-10-CM | POA: Diagnosis not present

## 2016-05-15 LAB — T4, FREE: Free T4: 0.79 ng/dL (ref 0.61–1.12)

## 2016-05-15 LAB — TSH: TSH: 1.407 u[IU]/mL (ref 0.350–4.500)

## 2016-05-15 NOTE — Telephone Encounter (Signed)
-----   Message from Carmon Ginsberg, [place] sent at 05/15/2016  9:45 AM EST ----- Normal thyroid function.

## 2016-05-15 NOTE — Telephone Encounter (Signed)
[id number] 

## 2016-05-15 NOTE — Telephone Encounter (Signed)
Pt is returning call.  NZ:[id number]

## 2016-05-15 NOTE — Telephone Encounter (Signed)
Pt stated she was returning nurse call. Thanks TNP °

## 2016-05-16 ENCOUNTER — Ambulatory Visit: Payer: 59 | Admitting: Gastroenterology

## 2016-05-16 ENCOUNTER — Other Ambulatory Visit: Payer: Self-pay | Admitting: Family Medicine

## 2016-05-16 ENCOUNTER — Telehealth: Payer: Self-pay

## 2016-05-16 ENCOUNTER — Other Ambulatory Visit: Payer: Self-pay

## 2016-05-16 DIAGNOSIS — F419 Anxiety disorder, unspecified: Secondary | ICD-10-CM

## 2016-05-16 MED ORDER — CLONAZEPAM 1 MG PO TABS: 1.0000 mg | ORAL_TABLET | Freq: Two times a day (BID) | ORAL | 5 refills | Status: DC | PRN

## 2016-05-16 NOTE — Progress Notes (Signed)
RX called in at [facility] outpatient pharmacy.

## 2016-05-16 NOTE — Telephone Encounter (Signed)
Pt called today regarding some symptoms she was having. Pt had a GI appt today with Dr. Allen Norris and was a no show. She informed us she overslept which is why she was a no show. This appt was confirmed with pt the day before. She stated she wasn't feeling well. Her heart rate was 150. She stated she did feel light headed yesterday. I have advised her to return to the ER to be evaluated. She stated she didn't want to go back to the ER because they would just send her home and she has already had a cardiac work up. I have advised her we cannot treat her over the phone and not see her in the office. Pt stated she understood and would go to the ER. I advised her Dr. Allen Norris was the GI on call today so if they needed a GI consult he would be available.

## 2016-05-16 NOTE — Telephone Encounter (Signed)
Patient called office stating that she has been experiencing heart palpitations today. Patient states that she was lying down and awoke to increased heart rate and checked her heart rate with a pulse ox that she had bought from store. Patient reports heart rate was at 131 upon awakening and has been as high as 150 today. Patient reports symptoms of nausea, fatigue and sweating. Patient mentioned that when she was in ER last she had spoken with a nurse who informed her that she might have an ulcer and an endoscopy might be needed. Patient states that she feels that she needs to return back to the ER and states "there is something very wrong with me, something is not right". KW

## 2016-05-16 NOTE — Telephone Encounter (Signed)
Spoke to Misty Zamora: I suggested that her anxiety is contributing to her physical symptoms. Instructed her to go up to clonazepam 1 mg.to be scheduled  twice daily. She has had prior cardiology evaluation for tachycardia earlier this year with 30 day event monitor demonstrative of sinus tachycardia only. Instructed to use propranolol on a prn basis.

## 2016-05-17 NOTE — Telephone Encounter (Signed)
Please see below I think you may have seen this but it is still open message. Thank you:)-aa

## 2016-05-20 ENCOUNTER — Encounter: Payer: Self-pay | Admitting: Family Medicine

## 2016-05-20 ENCOUNTER — Other Ambulatory Visit: Payer: Self-pay | Admitting: Family Medicine

## 2016-05-20 DIAGNOSIS — R1013 Epigastric pain: Secondary | ICD-10-CM

## 2016-05-23 ENCOUNTER — Telehealth: Payer: Self-pay | Admitting: Family Medicine

## 2016-05-23 NOTE — Telephone Encounter (Signed)
The decision regarding endoscopy is up to the G.I. Doctor. I am not willing to make that decision.

## 2016-05-23 NOTE — Telephone Encounter (Signed)
Pt is requesting that referral be made for endoscopy only.She states she was told by [place] department that if you ordered endoscopy only that she would not have to be seen for office visit

## 2016-05-23 NOTE — Telephone Encounter (Signed)
Please review. Thank you. sd  

## 2016-05-23 NOTE — Telephone Encounter (Signed)
Patient advised as below. Patient reports she will fu with GI at [facility]. sd

## 2016-06-04 ENCOUNTER — Other Ambulatory Visit: Payer: Self-pay

## 2016-06-04 ENCOUNTER — Encounter: Payer: Self-pay | Admitting: Family Medicine

## 2016-06-04 ENCOUNTER — Other Ambulatory Visit: Payer: Self-pay | Admitting: Family Medicine

## 2016-06-04 ENCOUNTER — Ambulatory Visit (INDEPENDENT_AMBULATORY_CARE_PROVIDER_SITE_OTHER): Payer: 59 | Admitting: Gastroenterology

## 2016-06-04 ENCOUNTER — Encounter: Payer: Self-pay | Admitting: Gastroenterology

## 2016-06-04 VITALS — BP 118/65 | HR 81 | Temp 97.8°F | Ht 69.0 in | Wt 229.0 lb

## 2016-06-04 DIAGNOSIS — R1013 Epigastric pain: Secondary | ICD-10-CM

## 2016-06-04 DIAGNOSIS — R748 Abnormal levels of other serum enzymes: Secondary | ICD-10-CM

## 2016-06-04 DIAGNOSIS — K219 Gastro-esophageal reflux disease without esophagitis: Secondary | ICD-10-CM | POA: Diagnosis not present

## 2016-06-04 DIAGNOSIS — G8929 Other chronic pain: Secondary | ICD-10-CM

## 2016-06-04 DIAGNOSIS — M545 Low back pain: Principal | ICD-10-CM

## 2016-06-04 MED ORDER — HYDROCODONE-ACETAMINOPHEN 10-325 MG PO TABS: ORAL_TABLET | ORAL | 0 refills | Status: DC

## 2016-06-04 NOTE — Progress Notes (Signed)
Gastroenterology Consultation  Referring Provider:     Carmon Ginsberg, [place] Primary Care Physician:  Carmon Ginsberg, [place] Primary Gastroenterologist:  Dr. Allen Norris     Reason for Consultation:     Epigastric pain        HPI:   Misty Zamora is a 41 y.o. y/o female referred for consultation & management of Epigastric pain by Dr. Carmon Ginsberg, [place].  This patient comes today with a long history of abdominal pain. The patient has had a gastric bypass in the past. 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 The patient was seen at [facility] and underwent an ERCP with endoscopic assist. They were unable to get the scope to the ampulla for evaluation. The patient then followed up with them subsequently after discussions were made with surgery for possible laparoscopic assisted ERCP but it was thought that due to the patient's normal liver enzymes at that time that she was not a candidate for a laparoscopic assisted procedure. The patient now reports that she has epigastric pain similar to the pain she had when she had an ulcer. There is no report of any black stools or bloody stools. The patient has also reported that she gained a significant amount of weight recently. She reports the abdominal pain is most of it and when she eats. She was seen in the ER and told to follow up with gastroenterology. The patient's lab work showed an isolated elevated AST at 66.   Past Medical History:  Diagnosis Date  . Anemia    had iron infusions. last one in Sept. told levels are now normal  . Anxiety   . 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 Bulging lumbar disc    causes bilateral leg pain  . Depression   . GERD (gastroesophageal reflux disease)    occasional  . Heart murmur 2005  . History of cardiovascular stress test 2005   showed MR and TR  . History of kidney stones   . Hypertension    no meds since gastric bypass  . IDA (iron deficiency anemia)     . PONV (postoperative nausea and vomiting)    hypotension with epidural  . Sleep apnea    was better after gastric bypass, hasd recently started snoring again.  . Wears contact lenses     Past Surgical History:  Procedure Laterality Date  . ABDOMINAL HYSTERECTOMY  12/2014   ovaries in situ  . BILATERAL SALPINGECTOMY Bilateral 12/27/2014   Procedure: BILATERAL SALPINGECTOMY;  Surgeon: Will Bonnet, MD;  Location: [facility];  Service: Gynecology;  Laterality: Bilateral;  . COLONOSCOPY  2010  . CYSTOSCOPY N/A 12/27/2014   Procedure: CYSTOSCOPY;  Surgeon: Will Bonnet, MD;  Location: [facility];  Service: Gynecology;  Laterality: N/A;  . DILATION AND CURETTAGE OF UTERUS    . ELBOW ARTHROSCOPY Left 03/30/2015   Procedure: ARTHROSCOPY ELBOW WITH DEBRIDEMENT OF THE SYMPTOMATIC PLICA;  Surgeon: Corky Mull, MD;  Location: [facility];  Service: Orthopedics;  Laterality: Left;  . ELBOW SURGERY Left   . ERCP    . gallbladder removed    . GASTRIC BYPASS  2012  . LAPAROSCOPIC HYSTERECTOMY N/A 12/27/2014   Procedure: HYSTERECTOMY TOTAL LAPAROSCOPIC;  Surgeon: Will Bonnet, MD;  Location: [facility];  Service: Gynecology;  Laterality: N/A;  . laproscopy    . RENAL ENDOSCOPY VIA NEPHROSTOMY / PYELOSTOMY  2010  . TUBAL LIGATION  Prior to Admission medications   Medication Sig Start Date End Date Taking? Authorizing Provider  albuterol (PROVENTIL HFA;VENTOLIN HFA) 108 (90 Base) MCG/ACT inhaler Inhale 2 puffs into the lungs every 6 (six) hours as needed for wheezing or shortness of breath. 04/07/16  Yes Nance Pear, MD  clonazePAM (KLONOPIN) 1 MG tablet Take 1 tablet (1 mg total) by mouth 2 (two) times daily as needed for anxiety. 05/16/16  Yes Carmon Ginsberg, PA  omeprazole (PRILOSEC) 40 MG capsule Take 1 capsule (40 mg total) by mouth daily. 05/14/16 05/14/17 Yes Rudene Re, MD  alum & mag hydroxide-simeth (MAALOX MAX) [id number] MG/5ML suspension Take 5 mLs by mouth  every 6 (six) hours as needed for indigestion. Patient not taking: Reported on 06/04/2016 05/14/16   Rudene Re, MD  benzonatate (TESSALON PERLES) 100 MG capsule Take 1 capsule (100 mg total) by mouth 3 (three) times daily as needed. Patient not taking: Reported on 06/04/2016 04/06/16   Marylene Land, NP  DULoxetine (CYMBALTA) 60 MG capsule Take one daily Patient not taking: Reported on 06/04/2016 11/23/15   Carmon Ginsberg, PA  erythromycin ophthalmic ointment Place 1 application into both eyes 4 (four) times daily. Patient not taking: Reported on 06/04/2016 03/28/16   Carmon Ginsberg, PA  furosemide (LASIX) 20 MG tablet Take by mouth. 10/02/15   Historical Provider, MD  furosemide (LASIX) 20 MG tablet Take 1 tablet (20 mg total) by mouth daily as needed for edema. Patient not taking: Reported on 06/04/2016 04/22/16   Minna Merritts, MD  HYDROcodone-acetaminophen [facility]) 10-325 MG tablet Limit 1 tablet by mouth 3-6 times per day as tolerated Patient not taking: Reported on 06/04/2016 06/04/16   Carmon Ginsberg, PA  promethazine (PHENERGAN) 25 MG tablet Take 1 tablet (25 mg total) by mouth every 6 (six) hours as needed for nausea or vomiting. Patient not taking: Reported on 06/04/2016 05/13/16   Carmon Ginsberg, PA  sulfacetamide (BLEPH-10) 10 % ophthalmic solution Place 2 drops into both eyes 4 (four) times daily. Patient not taking: Reported on 06/04/2016 03/19/16   Carmon Ginsberg, PA    Family History  Problem Relation Age of Onset  . Hypertension Mother   . Hyperlipidemia Mother   . Diabetes Mother   . Cancer Mother     breast cancer; 04/2011  . Colon polyps Mother   . Breast cancer Mother 54  . Hyperlipidemia Father   . Hypertension Father   . Colon polyps Father   . Hypertension Sister   . Hypertension Brother   . Cancer Maternal Uncle     terminal lung cancer  . Breast cancer Maternal Aunt   . Breast cancer Paternal Aunt   . Breast cancer Paternal Grandmother   . Bladder Cancer Neg Hx     . Kidney cancer Neg Hx      Social History  Substance Use Topics  . Smoking status: Former Smoker    Packs/day: 0.00    Years: 8.00    Types: Cigarettes    Quit date: 06/04/1999  . Smokeless tobacco: Never Used  . Alcohol use No     Comment: 1 drink every few months    Allergies as of 06/04/2016 - Review Complete 06/04/2016  Allergen Reaction Noted  . Compazine [prochlorperazine edisylate] Other (See Comments) 01/02/2016  . Ketorolac Palpitations 12/21/2015    Review of Systems:    All systems reviewed and negative except where noted in HPI.   Physical Exam:  BP 118/65   Pulse 81   Temp 97.8  F (36.6 C) (Oral)   Ht 5\' 9"  (1.753 m)   Wt 229 lb (103.9 kg)   LMP 12/14/2014 (Exact Date)   BMI 33.82 kg/m  Patient's last menstrual period was 12/14/2014 (exact date). Psych:  Alert and cooperative. Normal mood and affect. General:   Alert,  Well-developed, well-nourished, pleasant and cooperative in NAD Head:  Normocephalic and atraumatic. Eyes:  Sclera clear, no icterus.   Conjunctiva pink. Ears:  Normal auditory acuity. Nose:  No deformity, discharge, or lesions. Mouth:  No deformity or lesions,oropharynx pink & moist. Neck:  Supple; no masses or thyromegaly. Lungs:  Respirations even and unlabored.  Clear throughout to auscultation.   No wheezes, crackles, or rhonchi. No acute distress. Heart:  Regular rate and rhythm; no murmurs, clicks, rubs, or gallops. Abdomen:  Normal bowel sounds.  No bruits.  Soft, non-tender and non-distended without masses, hepatosplenomegaly or hernias noted.  No guarding or rebound tenderness.  Negative Carnett sign.   Rectal:  Deferred.  Msk:  Symmetrical without gross deformities.  Good, equal movement & strength bilaterally. Pulses:  Normal pulses noted. Extremities:  No clubbing or edema.  No cyanosis. Neurologic:  Alert and oriented x3;  grossly normal neurologically. Skin:  Intact without significant lesions or rashes.  No  jaundice. Lymph Nodes:  No significant cervical adenopathy. Psych:  Alert and cooperative. Normal mood and affect.  Imaging Studies: Dg Chest 2 View  Result Date: 05/14/2016 CLINICAL DATA:  Misty Zamora central chest pain with sudden onset at work during lunch break today, presyncopal, hypertension EXAM: CHEST  2 VIEW COMPARISON:  05/12/2016 FINDINGS: Normal heart size, mediastinal contours, and pulmonary vascularity. Lungs clear. Central peribronchial thickening, chronic. No pleural effusion or pneumothorax. Bones unremarkable. IMPRESSION: Mild chronic bronchitic changes without acute infiltrate. Electronically Signed   By: Lavonia Dana M.D.   On: 05/14/2016 13:24   Dg Chest 2 View  Result Date: 05/12/2016 CLINICAL DATA:  Palpitations EXAM: CHEST  2 VIEW COMPARISON:  04/07/2016 chest radiograph. FINDINGS: Stable cardiomediastinal silhouette with normal heart size. No pneumothorax. No pleural effusion. Lungs appear clear, with no acute consolidative airspace disease and no pulmonary edema. IMPRESSION: No active cardiopulmonary disease. Electronically Signed   By: Ilona Sorrel M.D.   On: 05/12/2016 15:25   Ct Abdomen Pelvis W Contrast  Result Date: 05/14/2016 CLINICAL DATA:  41 year old female with history of epigastric pain and nausea starting today. History of gastric bypass procedure. EXAM: CT ABDOMEN AND PELVIS WITH CONTRAST TECHNIQUE: Multidetector CT imaging of the abdomen and pelvis was performed using the standard protocol following bolus administration of intravenous contrast. CONTRAST:  113mL ISOVUE-300 IOPAMIDOL (ISOVUE-300) INJECTION 61% COMPARISON:  CT the abdomen and pelvis 12/25/2015. FINDINGS: Lower chest: Unremarkable. Hepatobiliary: No cystic or solid hepatic lesions. There is minimal intrahepatic biliary ductal dilatation. Status post cholecystectomy. Common bile duct is dilated measuring 12 mm in the porta hepatis, which is similar to the prior study, and likely reflective of benign post  cholecystectomy physiology. Pancreas: No pancreatic mass. No pancreatic ductal dilatation. No pancreatic or peripancreatic fluid or inflammatory changes. Spleen: Unremarkable. Adrenals/Urinary Tract: Bilateral adrenal glands and bilateral kidneys are normal in appearance. Stomach/Bowel: Postoperative changes of Roux-en-Y gastric bypass are noted. No pathologic dilatation of small bowel or colon. Normal appendix. Vascular/Lymphatic: No significant atherosclerotic disease, aneurysm or dissection identified in the abdominal or pelvic vasculature. No lymphadenopathy noted in the abdomen or pelvis. Reproductive: Status post hysterectomy. Ovaries are not confidently identified may be surgically absent or atrophic. Other: No significant volume of  ascites.  No pneumoperitoneum. Musculoskeletal: There are no aggressive appearing lytic or blastic lesions noted in the visualized portions of the skeleton. IMPRESSION: 1. No acute findings in the abdomen or pelvis to account for the patient's symptoms. 2. Status post Roux-en-Y gastric bypass. 3. Normal appendix. 4. Additional incidental findings, as above. Electronically Signed   By: Vinnie Langton M.D.   On: 05/14/2016 19:18    Assessment and Plan:   LAKENDA HEIT is a 41 y.o. y/o female who comes in today after being seen in the ER with abdominal pain back in December. The patient has had weight gain recently despite her gastric bypass. The patient has a history of anastomotic ulcers. The patient will be set up for a EGD. The patient was also offered Carafate since that has helped her in the past but she states she would like to hold off on that at the present time.    [name], MD. Marval Regal   Note: This dictation was prepared with Dragon dictation along with smaller phrase technology. Any transcriptional errors that result from this process are unintentional.

## 2016-06-05 ENCOUNTER — Other Ambulatory Visit: Payer: Self-pay

## 2016-06-05 ENCOUNTER — Encounter: Payer: Self-pay | Admitting: *Deleted

## 2016-06-05 NOTE — Discharge Instructions (Signed)

## 2016-06-06 ENCOUNTER — Encounter
Admission: RE | Disposition: A | Discharge status: Home / Self Care | Payer: Self-pay | Source: Ambulatory Visit | Attending: Gastroenterology

## 2016-06-06 ENCOUNTER — Ambulatory Visit: Payer: 59 | Admitting: Anesthesiology

## 2016-06-06 ENCOUNTER — Ambulatory Visit
Admission: RE | Admit: 2016-06-06 | Discharge: 2016-06-06 | Disposition: A | Discharge status: Home / Self Care | Payer: 59 | Source: Ambulatory Visit | Attending: Gastroenterology | Admitting: Gastroenterology

## 2016-06-06 DIAGNOSIS — R000 Tachycardia, unspecified: Secondary | ICD-10-CM | POA: Diagnosis not present

## 2016-06-06 DIAGNOSIS — D509 Iron deficiency anemia, unspecified: Secondary | ICD-10-CM | POA: Diagnosis not present

## 2016-06-06 DIAGNOSIS — F419 Anxiety disorder, unspecified: Secondary | ICD-10-CM | POA: Diagnosis not present

## 2016-06-06 DIAGNOSIS — Z803 Family history of malignant neoplasm of breast: Secondary | ICD-10-CM | POA: Insufficient documentation

## 2016-06-06 DIAGNOSIS — K219 Gastro-esophageal reflux disease without esophagitis: Secondary | ICD-10-CM | POA: Diagnosis not present

## 2016-06-06 DIAGNOSIS — Z79899 Other long term (current) drug therapy: Secondary | ICD-10-CM | POA: Diagnosis not present

## 2016-06-06 DIAGNOSIS — Z8371 Family history of colonic polyps: Secondary | ICD-10-CM | POA: Diagnosis not present

## 2016-06-06 DIAGNOSIS — I10 Essential (primary) hypertension: Secondary | ICD-10-CM | POA: Diagnosis not present

## 2016-06-06 DIAGNOSIS — R110 Nausea: Secondary | ICD-10-CM | POA: Diagnosis not present

## 2016-06-06 DIAGNOSIS — Z9884 Bariatric surgery status: Secondary | ICD-10-CM | POA: Diagnosis not present

## 2016-06-06 DIAGNOSIS — Z87442 Personal history of urinary calculi: Secondary | ICD-10-CM | POA: Insufficient documentation

## 2016-06-06 DIAGNOSIS — Z801 Family history of malignant neoplasm of trachea, bronchus and lung: Secondary | ICD-10-CM | POA: Diagnosis not present

## 2016-06-06 DIAGNOSIS — Z90710 Acquired absence of both cervix and uterus: Secondary | ICD-10-CM | POA: Diagnosis not present

## 2016-06-06 DIAGNOSIS — Z888 Allergy status to other drugs, medicaments and biological substances status: Secondary | ICD-10-CM | POA: Diagnosis not present

## 2016-06-06 DIAGNOSIS — R011 Cardiac murmur, unspecified: Secondary | ICD-10-CM | POA: Insufficient documentation

## 2016-06-06 DIAGNOSIS — J45909 Unspecified asthma, uncomplicated: Secondary | ICD-10-CM | POA: Diagnosis not present

## 2016-06-06 DIAGNOSIS — Z8249 Family history of ischemic heart disease and other diseases of the circulatory system: Secondary | ICD-10-CM | POA: Insufficient documentation

## 2016-06-06 DIAGNOSIS — F329 Major depressive disorder, single episode, unspecified: Secondary | ICD-10-CM | POA: Diagnosis not present

## 2016-06-06 DIAGNOSIS — Z87891 Personal history of nicotine dependence: Secondary | ICD-10-CM | POA: Diagnosis not present

## 2016-06-06 DIAGNOSIS — G4730 Sleep apnea, unspecified: Secondary | ICD-10-CM | POA: Insufficient documentation

## 2016-06-06 DIAGNOSIS — R1013 Epigastric pain: Secondary | ICD-10-CM | POA: Insufficient documentation

## 2016-06-06 DIAGNOSIS — Z833 Family history of diabetes mellitus: Secondary | ICD-10-CM | POA: Insufficient documentation

## 2016-06-06 HISTORY — DX: Family history of other specified conditions: Z84.89

## 2016-06-06 HISTORY — PX: ESOPHAGOGASTRODUODENOSCOPY (EGD) WITH PROPOFOL: SHX5813

## 2016-06-06 SURGERY — ESOPHAGOGASTRODUODENOSCOPY (EGD) WITH PROPOFOL
Anesthesia: Monitor Anesthesia Care | Site: Mouth | Wound class: Clean Contaminated

## 2016-06-06 MED ORDER — LACTATED RINGERS IV SOLN
INTRAVENOUS | Status: DC | PRN
  Administered 2016-06-06: 11:00:00 via INTRAVENOUS

## 2016-06-06 MED ORDER — PROPOFOL 10 MG/ML IV BOLUS
INTRAVENOUS | Status: DC | PRN
  Administered 2016-06-06 (×2): 50 mg via INTRAVENOUS
  Administered 2016-06-06: 150 mg via INTRAVENOUS

## 2016-06-06 MED ORDER — GLYCOPYRROLATE 0.2 MG/ML IJ SOLN
INTRAMUSCULAR | Status: DC | PRN
  Administered 2016-06-06: 0.1 mg via INTRAVENOUS

## 2016-06-06 MED ORDER — LIDOCAINE HCL (CARDIAC) 20 MG/ML IV SOLN
INTRAVENOUS | Status: DC | PRN
  Administered 2016-06-06: 50 mg via INTRAVENOUS

## 2016-06-06 MED ORDER — STERILE WATER FOR IRRIGATION IR SOLN
Status: DC | PRN
  Administered 2016-06-06: 11:00:00

## 2016-06-06 SURGICAL SUPPLY — 32 items
BALLN DILATOR 10-12 8 (BALLOONS)
BALLN DILATOR 12-15 8 (BALLOONS)
BALLN DILATOR 15-18 8 (BALLOONS)
BALLN DILATOR CRE 0-12 8 (BALLOONS)
BALLN DILATOR ESOPH 8 10 CRE (MISCELLANEOUS) IMPLANT
BALLOON DILATOR 12-15 8 (BALLOONS) IMPLANT
BALLOON DILATOR 15-18 8 (BALLOONS) IMPLANT
BALLOON DILATOR CRE 0-12 8 (BALLOONS) IMPLANT
BLOCK BITE 60FR ADLT L/F GRN (MISCELLANEOUS) ×2 IMPLANT
CANISTER SUCT 1200ML W/VALVE (MISCELLANEOUS) ×2 IMPLANT
CLIP HMST 235XBRD CATH ROT (MISCELLANEOUS) IMPLANT
CLIP RESOLUTION 360 11X235 (MISCELLANEOUS)
FCP ESCP3.2XJMB 240X2.8X (MISCELLANEOUS)
FORCEPS BIOP RAD 4 LRG CAP 4 (CUTTING FORCEPS) IMPLANT
FORCEPS BIOP RJ4 240 W/NDL (MISCELLANEOUS)
FORCEPS ESCP3.2XJMB 240X2.8X (MISCELLANEOUS) IMPLANT
GOWN CVR UNV OPN BCK APRN NK (MISCELLANEOUS) ×2 IMPLANT
GOWN ISOL THUMB LOOP REG UNIV (MISCELLANEOUS) ×2
INJECTOR VARIJECT VIN23 (MISCELLANEOUS) IMPLANT
KIT DEFENDO VALVE AND CONN (KITS) IMPLANT
KIT ENDO PROCEDURE OLY (KITS) ×2 IMPLANT
MARKER SPOT ENDO TATTOO 5ML (MISCELLANEOUS) IMPLANT
PAD GROUND ADULT SPLIT (MISCELLANEOUS) IMPLANT
RETRIEVER NET PLAT FOOD (MISCELLANEOUS) IMPLANT
SNARE SHORT THROW 13M SML OVAL (MISCELLANEOUS) IMPLANT
SNARE SHORT THROW 30M LRG OVAL (MISCELLANEOUS) IMPLANT
SPOT EX ENDOSCOPIC TATTOO (MISCELLANEOUS)
SYR INFLATION 60ML (SYRINGE) IMPLANT
TRAP ETRAP POLY (MISCELLANEOUS) IMPLANT
VARIJECT INJECTOR VIN23 (MISCELLANEOUS)
WATER STERILE IRR 250ML POUR (IV SOLUTION) ×2 IMPLANT
WIRE CRE 18-20MM 8CM F G (MISCELLANEOUS) IMPLANT

## 2016-06-06 NOTE — Op Note (Signed)
[facility] Gastroenterology Patient Name: Misty Zamora Procedure Date: 06/06/2016 11:07 AM MRN: [id number] Account #: [id number] Date of Birth: [birth year] Admit Type: Outpatient Age: 41 Room: [facility] OR ROOM 01 Gender: Female Note Status: Finalized Procedure:            Upper GI endoscopy Indications:          Epigastric abdominal pain, Nausea Providers:            [name] MD, MD Referring MD:         Rose Fillers. Jaynie Crumble, MD (Referring MD) Medicines:            Propofol per Anesthesia Complications:        No immediate complications. Procedure:            Pre-Anesthesia Assessment:                       - Prior to the procedure, a History and Physical was                        performed, and patient medications and allergies were                        reviewed. The patient's tolerance of previous                        anesthesia was also reviewed. The risks and benefits of                        the procedure and the sedation options and risks were                        discussed with the patient. All questions were                        answered, and informed consent was obtained. Prior                        Anticoagulants: The patient has taken no previous                        anticoagulant or antiplatelet agents. ASA Grade                        Assessment: II - A patient with mild systemic disease.                        After reviewing the risks and benefits, the patient was                        deemed in satisfactory condition to undergo the                        procedure.                       After obtaining informed consent, the endoscope was                        passed under direct vision. Throughout the procedure,  the patient's blood pressure, pulse, and oxygen                        saturations were monitored continuously. The was                        introduced through the mouth, and advanced to the            jejunum. The upper GI endoscopy was accomplished                        without difficulty. The patient tolerated the procedure                        well. Findings:      The examined esophagus was normal.      Evidence of a gastric bypass was found. A gastric pouch with a normal       size was found. The staple line appeared intact. The gastrojejunal       anastomosis was characterized by healthy appearing mucosa. This was       traversed. The pouch-to-jejunum limb was characterized by healthy       appearing mucosa. The duodenum-to-jejunum limb was examined.      The examined jejunum was normal. Impression:           - Normal esophagus.                       - Gastric bypass with a normal-sized pouch and intact                        staple line. Gastrojejunal anastomosis characterized by                        healthy appearing mucosa.                       - Normal examined jejunum.                       - No specimens collected. Recommendation:       - Discharge patient to home.                       - Resume previous diet.                       - Continue present medications. Procedure Code(s):    --- Professional ---                       [id number], Esophagogastroduodenoscopy, flexible, transoral;                        diagnostic, including collection of specimen(s) by                        brushing or washing, when performed (separate procedure) Diagnosis Code(s):    --- Professional ---                       R11.0, Nausea                       R10.13,  Epigastric pain                       Z98.84, Bariatric surgery status CPT copyright 2016 American Medical Association. All rights reserved. The codes documented in this report are preliminary and upon coder review may  be revised to meet current compliance requirements. [name] MD, MD 06/06/2016 11:30:16 AM This report has been signed electronically. Number of Addenda: 0 Note Initiated On: 06/06/2016 11:07 AM Total  Procedure Duration: 0 hours 1 minute 46 seconds       [facility]

## 2016-06-06 NOTE — Anesthesia Postprocedure Evaluation (Signed)
Anesthesia Post Note  Patient: Misty Zamora  Procedure(s) Performed: Procedure(s) (LRB): ESOPHAGOGASTRODUODENOSCOPY (EGD) WITH PROPOFOL (N/A)  Patient location during evaluation: PACU Anesthesia Type: MAC Level of consciousness: awake and alert Pain management: pain level controlled Vital Signs Assessment: post-procedure vital signs reviewed and stable Respiratory status: spontaneous breathing, nonlabored ventilation, respiratory function stable and patient connected to nasal cannula oxygen Cardiovascular status: stable and blood pressure returned to baseline Anesthetic complications: no    Latrecia Capito C      

## 2016-06-06 NOTE — H&P (Signed)
[name], MD [facility] [address], [place], [place] [address] Phone: [phone] Fax : [phone]  Primary Care Physician:  Carmon Ginsberg, [place] Primary Gastroenterologist:  Dr. Allen Norris  Pre-Procedure History & Physical: HPI:  Misty Zamora is a 41 y.o. female is here for an endoscopy.   Past Medical History:  Diagnosis Date  . Anemia    had iron infusions. last one in Sept. told levels are now normal  . Anxiety   . 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 Bulging lumbar disc    causes bilateral leg pain  . Depression   . Family history of adverse reaction to anesthesia    Father - PONV  . GERD (gastroesophageal reflux disease)    occasional  . Heart murmur 2005  . History of cardiovascular stress test 2005   showed MR and TR  . History of kidney stones   . Hypertension    no meds since gastric bypass  . IDA (iron deficiency anemia)   . PONV (postoperative nausea and vomiting)    hypotension with epidural  . Sleep apnea    was better after gastric bypass, hasd recently started snoring again.  . Wears contact lenses     Past Surgical History:  Procedure Laterality Date  . ABDOMINAL HYSTERECTOMY  12/2014   ovaries in situ  . BILATERAL SALPINGECTOMY Bilateral 12/27/2014   Procedure: BILATERAL SALPINGECTOMY;  Surgeon: Will Bonnet, MD;  Location: [facility];  Service: Gynecology;  Laterality: Bilateral;  . COLONOSCOPY  2010  . CYSTOSCOPY N/A 12/27/2014   Procedure: CYSTOSCOPY;  Surgeon: Will Bonnet, MD;  Location: [facility];  Service: Gynecology;  Laterality: N/A;  . DILATION AND CURETTAGE OF UTERUS    . ELBOW ARTHROSCOPY Left 03/30/2015   Procedure: ARTHROSCOPY ELBOW WITH DEBRIDEMENT OF THE SYMPTOMATIC PLICA;  Surgeon: Corky Mull, MD;  Location: [place];  Service: Orthopedics;  Laterality: Left;  . ELBOW SURGERY Left   . ERCP    . gallbladder removed    . GASTRIC BYPASS  2012  . LAPAROSCOPIC HYSTERECTOMY N/A 12/27/2014   Procedure:  HYSTERECTOMY TOTAL LAPAROSCOPIC;  Surgeon: Will Bonnet, MD;  Location: [facility];  Service: Gynecology;  Laterality: N/A;  . laproscopy    . RENAL ENDOSCOPY VIA NEPHROSTOMY / PYELOSTOMY  2010  . TUBAL LIGATION      Prior to Admission medications   Medication Sig Start Date End Date Taking? Authorizing Provider  albuterol (PROVENTIL HFA;VENTOLIN HFA) 108 (90 Base) MCG/ACT inhaler Inhale 2 puffs into the lungs every 6 (six) hours as needed for wheezing or shortness of breath. 04/07/16  Yes Nance Pear, MD  clonazePAM (KLONOPIN) 1 MG tablet Take 1 tablet (1 mg total) by mouth 2 (two) times daily as needed for anxiety. 05/16/16  Yes Carmon Ginsberg, PA  omeprazole (PRILOSEC) 40 MG capsule Take 1 capsule (40 mg total) by mouth daily. 05/14/16 05/14/17 Yes Rudene Re, MD  furosemide (LASIX) 20 MG tablet Take 1 tablet (20 mg total) by mouth daily as needed for edema. Patient not taking: Reported on 06/06/2016 04/22/16   Minna Merritts, MD    Allergies as of 06/05/2016 - Review Complete 06/05/2016  Allergen Reaction Noted  . Compazine [prochlorperazine edisylate] Other (See Comments) 01/02/2016  . Ketorolac Palpitations 12/21/2015    Family History  Problem Relation Age of Onset  . Hypertension Mother   . Hyperlipidemia Mother   . Diabetes Mother   . Cancer Mother  breast cancer; 04/2011  . Colon polyps Mother   . Breast cancer Mother 68  . Hyperlipidemia Father   . Hypertension Father   . Colon polyps Father   . Hypertension Sister   . Hypertension Brother   . Cancer Maternal Uncle     terminal lung cancer  . Breast cancer Maternal Aunt   . Breast cancer Paternal Aunt   . Breast cancer Paternal Grandmother   . Bladder Cancer Neg Hx   . Kidney cancer Neg Hx     Social History   Social History  . Marital status: Married    Spouse name: N/A  . Number of children: N/A  . Years of education: N/A   Occupational History  .      Full Time   Social History  Main Topics  . Smoking status: Former Smoker    Packs/day: 0.00    Years: 8.00    Types: Cigarettes    Quit date: 06/04/1999  . Smokeless tobacco: Never Used  . Alcohol use No     Comment: 1 drink every few months  . Drug use: No  . Sexual activity: Not on file   Other Topics Concern  . Not on file   Social History Narrative   No regular exercise          Review of Systems: See HPI, otherwise negative ROS  Physical Exam: BP 119/67   Pulse 81   Temp 97.7 F (36.5 C) (Tympanic)   Resp 16   Ht 5\' 9"  (1.753 m)   Wt 226 lb (102.5 kg)   LMP 12/14/2014 (Exact Date)   SpO2 100%   BMI 33.37 kg/m  General:   Alert,  pleasant and cooperative in NAD Head:  Normocephalic and atraumatic. Neck:  Supple; no masses or thyromegaly. Lungs:  Clear throughout to auscultation.    Heart:  Regular rate and rhythm. Abdomen:  Soft, nontender and nondistended. Normal bowel sounds, without guarding, and without rebound.   Neurologic:  Alert and  oriented x4;  grossly normal neurologically.  Impression/Plan: Misty Zamora is here for an endoscopy to be performed for epigastric pain abnormal CT  Risks, benefits, limitations, and alternatives regarding  endoscopy have been reviewed with the patient.  Questions have been answered.  All parties agreeable.   [name], MD  06/06/2016, 10:26 AM

## 2016-06-06 NOTE — Anesthesia Postprocedure Evaluation (Signed)
Anesthesia Post Note  Patient: Misty Zamora  Procedure(s) Performed: Procedure(s) (LRB): ESOPHAGOGASTRODUODENOSCOPY (EGD) WITH PROPOFOL (N/A)  Patient location during evaluation: PACU Anesthesia Type: MAC Level of consciousness: awake and alert Pain management: pain level controlled Vital Signs Assessment: post-procedure vital signs reviewed and stable Respiratory status: spontaneous breathing, nonlabored ventilation, respiratory function stable and patient connected to nasal cannula oxygen Cardiovascular status: stable and blood pressure returned to baseline Anesthetic complications: no    Mliss Wedin C

## 2016-06-06 NOTE — Anesthesia Preprocedure Evaluation (Signed)
Anesthesia Evaluation  Patient identified by MRN, date of birth, ID band Patient awake    Reviewed: Allergy & Precautions, NPO status , Patient's Chart, lab work & pertinent test results  History of Anesthesia Complications (+) PONV  Airway Mallampati: II  TM Distance: >3 FB Neck ROM: Full    Dental no notable dental hx.    Pulmonary sleep apnea , former smoker,  Asthma   Pulmonary exam normal breath sounds clear to auscultation       Cardiovascular hypertension, negative cardio ROS Normal cardiovascular exam+ Valvular Problems/Murmurs  Rhythm:Regular Rate:Normal + Systolic murmurs Mets 4+. Does 45 minutes on the treadmill for exercise.  History of sinus tachycardia.  Eval by cardiologist   Neuro/Psych Anxiety Depression  Neuromuscular disease negative neurological ROS  negative psych ROS   GI/Hepatic negative GI ROS, Neg liver ROS, GERD  Controlled,  Endo/Other  negative endocrine ROS  Renal/GU negative Renal ROS  negative genitourinary   Musculoskeletal negative musculoskeletal ROS (+) Arthritis ,   Abdominal   Peds negative pediatric ROS (+)  Hematology negative hematology ROS (+)   Anesthesia Other Findings   Reproductive/Obstetrics negative OB ROS                             Anesthesia Physical Anesthesia Plan  ASA: II  Anesthesia Plan: MAC   Post-op Pain Management:    Induction: Intravenous  Airway Management Planned:   Additional Equipment:   Intra-op Plan:   Post-operative Plan: Extubation in OR  Informed Consent: I have reviewed the patients History and Physical, chart, labs and discussed the procedure including the risks, benefits and alternatives for the proposed anesthesia with the patient or authorized representative who has indicated his/her understanding and acceptance.   Dental advisory given  Plan Discussed with: CRNA  Anesthesia Plan Comments:          Anesthesia Quick Evaluation

## 2016-06-06 NOTE — Transfer of Care (Signed)
Immediate Anesthesia Transfer of Care Note  Patient: Misty Zamora  Procedure(s) Performed: Procedure(s): ESOPHAGOGASTRODUODENOSCOPY (EGD) WITH PROPOFOL (N/A)  Patient Location: PACU  Anesthesia Type: MAC  Level of Consciousness: awake, alert  and patient cooperative  Airway and Oxygen Therapy: Patient Spontanous Breathing and Patient connected to supplemental oxygen  Post-op Assessment: Post-op Vital signs reviewed, Patient's Cardiovascular Status Stable, Respiratory Function Stable, Patent Airway and No signs of Nausea or vomiting  Post-op Vital Signs: Reviewed and stable  Complications: No apparent anesthesia complications

## 2016-06-06 NOTE — Anesthesia Procedure Notes (Signed)
Procedure Name: MAC Date/Time: 06/06/2016 11:18 AM Performed by: Cameron Ali Pre-anesthesia Checklist: Patient identified, Emergency Drugs available, Suction available, Timeout performed and Patient being monitored Patient Re-evaluated:Patient Re-evaluated prior to inductionOxygen Delivery Method: Nasal cannula Placement Confirmation: positive ETCO2

## 2016-06-07 ENCOUNTER — Telehealth: Payer: Self-pay | Admitting: Gastroenterology

## 2016-06-07 ENCOUNTER — Encounter: Payer: Self-pay | Admitting: Gastroenterology

## 2016-06-07 NOTE — Telephone Encounter (Signed)
Pt is wanting to know the next step for the elevated AST you discussed with her during her procedure. She said she is still having pain. She stated she was told by the GI doctor at [facility] she was not a candidate for a surgical ERCP due to her previous bypass surgery. If she still needs to see that doctor we will need to send a referral to them. Please advise.

## 2016-06-07 NOTE — Telephone Encounter (Signed)
LVM for pt to return my call.

## 2016-06-07 NOTE — Telephone Encounter (Signed)
Patient left a voice message that she had an EGD yesterday and was told she had elevated ACC. She would like to know who she should follow up with now? She was seeing someone at [facility] in the hepibiliary dept. Please call

## 2016-06-10 NOTE — Telephone Encounter (Signed)
She had the upper endoscopy for her abdominal pain to look for any anastomotic ulcers. 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 I would defer back to the doctors at [facility] for her abnormal liver enzymes.

## 2016-06-11 DIAGNOSIS — K219 Gastro-esophageal reflux disease without esophagitis: Secondary | ICD-10-CM | POA: Diagnosis not present

## 2016-06-11 DIAGNOSIS — R112 Nausea with vomiting, unspecified: Secondary | ICD-10-CM | POA: Diagnosis not present

## 2016-06-11 DIAGNOSIS — M533 Sacrococcygeal disorders, not elsewhere classified: Secondary | ICD-10-CM | POA: Diagnosis not present

## 2016-06-11 DIAGNOSIS — R110 Nausea: Secondary | ICD-10-CM | POA: Diagnosis not present

## 2016-06-11 DIAGNOSIS — R1013 Epigastric pain: Secondary | ICD-10-CM | POA: Diagnosis not present

## 2016-06-11 DIAGNOSIS — F419 Anxiety disorder, unspecified: Secondary | ICD-10-CM | POA: Diagnosis not present

## 2016-06-11 DIAGNOSIS — M47817 Spondylosis without myelopathy or radiculopathy, lumbosacral region: Secondary | ICD-10-CM | POA: Diagnosis not present

## 2016-06-11 DIAGNOSIS — M5416 Radiculopathy, lumbar region: Secondary | ICD-10-CM | POA: Diagnosis not present

## 2016-06-11 DIAGNOSIS — Z9884 Bariatric surgery status: Secondary | ICD-10-CM | POA: Diagnosis not present

## 2016-06-11 DIAGNOSIS — R197 Diarrhea, unspecified: Secondary | ICD-10-CM | POA: Diagnosis not present

## 2016-06-11 DIAGNOSIS — M791 Myalgia: Secondary | ICD-10-CM | POA: Diagnosis not present

## 2016-06-11 DIAGNOSIS — Z87891 Personal history of nicotine dependence: Secondary | ICD-10-CM | POA: Diagnosis not present

## 2016-06-11 DIAGNOSIS — Z79899 Other long term (current) drug therapy: Secondary | ICD-10-CM | POA: Diagnosis not present

## 2016-06-11 DIAGNOSIS — D649 Anemia, unspecified: Secondary | ICD-10-CM | POA: Diagnosis not present

## 2016-06-25 ENCOUNTER — Other Ambulatory Visit: Payer: Self-pay | Admitting: Family Medicine

## 2016-06-25 ENCOUNTER — Encounter: Payer: Self-pay | Admitting: Family Medicine

## 2016-06-25 ENCOUNTER — Other Ambulatory Visit: Payer: Self-pay

## 2016-06-25 DIAGNOSIS — F419 Anxiety disorder, unspecified: Secondary | ICD-10-CM

## 2016-06-25 NOTE — Telephone Encounter (Signed)
Pt called requesting refill on Hydrocodone and Clonazepam. I do not see when the last hydrocodone was prescribed. Renaldo Fiddler, CMA

## 2016-06-26 ENCOUNTER — Telehealth: Payer: Self-pay

## 2016-06-26 ENCOUNTER — Other Ambulatory Visit: Payer: Self-pay | Admitting: Family Medicine

## 2016-06-26 NOTE — Telephone Encounter (Signed)
Patient is stating she sent Mikki Santee a email regarding her pain and medication refill. She is requesting a call back or a refill on the Hydrocodone. Pt states the pain is really bad today , and she is at work. CB# [phone]

## 2016-06-26 NOTE — Telephone Encounter (Signed)
This has been answered by E-mail. She is not due for refills until 07/04/2016. Sombrillo reviewed.

## 2016-06-26 NOTE — Telephone Encounter (Signed)
Please review. Thanks!  

## 2016-06-27 ENCOUNTER — Ambulatory Visit (INDEPENDENT_AMBULATORY_CARE_PROVIDER_SITE_OTHER): Payer: 59 | Admitting: Family Medicine

## 2016-06-27 ENCOUNTER — Encounter: Payer: Self-pay | Admitting: Family Medicine

## 2016-06-27 VITALS — BP 112/70 | HR 73 | Temp 98.3°F | Resp 16 | Wt 226.4 lb

## 2016-06-27 DIAGNOSIS — M5116 Intervertebral disc disorders with radiculopathy, lumbar region: Secondary | ICD-10-CM

## 2016-06-27 DIAGNOSIS — M47816 Spondylosis without myelopathy or radiculopathy, lumbar region: Secondary | ICD-10-CM | POA: Diagnosis not present

## 2016-06-27 DIAGNOSIS — F419 Anxiety disorder, unspecified: Secondary | ICD-10-CM

## 2016-06-27 DIAGNOSIS — M48062 Spinal stenosis, lumbar region with neurogenic claudication: Secondary | ICD-10-CM | POA: Diagnosis not present

## 2016-06-27 DIAGNOSIS — M5136 Other intervertebral disc degeneration, lumbar region: Secondary | ICD-10-CM | POA: Diagnosis not present

## 2016-06-27 MED ORDER — OXYCODONE HCL 5 MG PO TABA: ORAL_TABLET | ORAL | 0 refills | Status: DC

## 2016-06-27 MED ORDER — OXYCODONE HCL 5 MG PO TABS: 5.0000 mg | ORAL_TABLET | ORAL | 0 refills | Status: DC | PRN

## 2016-06-27 NOTE — Patient Instructions (Signed)
Stick to the prescribed frequency of pain medication pending your next office visit.

## 2016-06-27 NOTE — Progress Notes (Signed)
Subjective:     Patient ID: Misty Zamora, female   DOB: 08/22/1975, 41 y.o.   MRN: [id number]  HPI  Chief Complaint  Patient presents with  . Back Pain    Patient comes in office today to address chronic back pain. Patient reports that she went to neurosurgeon today and discussed updating x-ray and future MRI. Patient states that neurologist feels that she should have surgery, patient states pain is more increased and is difficult for her to walk and move around. Patient states that she has been taking Tylenol and Ibuprofen with no relief.   States she saw Dr. Cari Caraway at [facility]. He has started her on Lyrica. States she now has burning pain down her left leg. Reports she ran out of hydrocodone a week early as she was not getting relief from her back pain and took more than prescribed.  States she discontinued duloxetine as she did not feel it was helping her back pain or her anxiety. Feels twice daily clonazepam is controlling her anxiety at the present time. (reports previously being on sertraline and escitalopram with little response). Scores 3 on the PHQ-9   Review of Systems     Objective:   Physical Exam  Constitutional: She appears well-developed and well-nourished. She appears distressed (moderate from back pain).  Psychiatric: She has a normal mood and affect. Her behavior is normal.       Assessment:    1. DDD (degenerative disc disease), lumba - oxyCODONE (OXY IR/ROXICODONE) 5 MG immediate release tablet; Take 1 tablet (5 mg total) by mouth every 4 (four) hours as needed for severe pain.  Dispense: 40 tablet; Refill: 0  2. Neuritis or radiculitis due to rupture of lumbar intervertebral disc - oxyCODONE (OXY IR/ROXICODONE) 5 MG immediate release tablet; Take 1 tablet (5 mg total) by mouth every 4 (four) hours as needed for severe pain.  Dispense: 40 tablet; Refill: 0  3. Anxiety: continue clonazepam    Plan:    Instructed not to take oxycodone more frequently than every 4 hours  pending o.v. In one week for dose adjustment.

## 2016-06-28 ENCOUNTER — Other Ambulatory Visit: Payer: Self-pay | Admitting: Neurosurgery

## 2016-06-28 DIAGNOSIS — M48062 Spinal stenosis, lumbar region with neurogenic claudication: Secondary | ICD-10-CM

## 2016-06-28 NOTE — Telephone Encounter (Signed)
Please review. Thank you-aa 

## 2016-07-03 ENCOUNTER — Ambulatory Visit
Admission: RE | Admit: 2016-07-03 | Discharge: 2016-07-03 | Disposition: A | Discharge status: Home / Self Care | Payer: 59 | Source: Ambulatory Visit | Attending: Neurosurgery | Admitting: Neurosurgery

## 2016-07-03 DIAGNOSIS — M5136 Other intervertebral disc degeneration, lumbar region: Secondary | ICD-10-CM | POA: Diagnosis not present

## 2016-07-03 DIAGNOSIS — M48062 Spinal stenosis, lumbar region with neurogenic claudication: Secondary | ICD-10-CM | POA: Insufficient documentation

## 2016-07-03 DIAGNOSIS — M48061 Spinal stenosis, lumbar region without neurogenic claudication: Secondary | ICD-10-CM | POA: Diagnosis not present

## 2016-07-04 ENCOUNTER — Ambulatory Visit: Payer: 59 | Admitting: Family Medicine

## 2016-07-05 ENCOUNTER — Telehealth: Payer: Self-pay

## 2016-07-05 DIAGNOSIS — M5136 Other intervertebral disc degeneration, lumbar region: Secondary | ICD-10-CM

## 2016-07-05 DIAGNOSIS — M5116 Intervertebral disc disorders with radiculopathy, lumbar region: Secondary | ICD-10-CM

## 2016-07-05 MED ORDER — OXYCODONE HCL 5 MG PO TABS: 5.0000 mg | ORAL_TABLET | ORAL | 0 refills | Status: DC | PRN

## 2016-07-05 NOTE — Telephone Encounter (Signed)
Patient called office today requesting refill on her Oxycodone 5mg , she states that she forgot her appt yesterday with Mikki Santee and thought it was today. Patient was informed that there are no more available appts today. Patient states that she only has two pills left and wanted to know if another physician could fill her script till she is able to see Mikki Santee on Tuesday. Please review and advise. KW

## 2016-07-09 ENCOUNTER — Encounter: Payer: Self-pay | Admitting: Family Medicine

## 2016-07-09 ENCOUNTER — Ambulatory Visit (INDEPENDENT_AMBULATORY_CARE_PROVIDER_SITE_OTHER): Payer: 59 | Admitting: Family Medicine

## 2016-07-09 ENCOUNTER — Ambulatory Visit: Payer: Self-pay | Admitting: Family Medicine

## 2016-07-09 VITALS — BP 90/52 | HR 75 | Temp 98.9°F | Resp 16 | Wt 228.0 lb

## 2016-07-09 DIAGNOSIS — M5136 Other intervertebral disc degeneration, lumbar region: Secondary | ICD-10-CM | POA: Diagnosis not present

## 2016-07-09 DIAGNOSIS — M5116 Intervertebral disc disorders with radiculopathy, lumbar region: Secondary | ICD-10-CM | POA: Diagnosis not present

## 2016-07-09 MED ORDER — OXYCODONE HCL 10 MG PO TABS
ORAL_TABLET | ORAL | 0 refills | Status: DC
Start: 2016-07-09 — End: 2016-07-25

## 2016-07-09 NOTE — Patient Instructions (Signed)
Change clonazepam to 1/2 pill as needed for anxiety. Do not schedule on higher dose of oxycodone.

## 2016-07-09 NOTE — Progress Notes (Signed)
Subjective:     Patient ID: Misty Zamora, female   DOB: 28-Dec-1975, 41 y.o.   MRN: [id number]  HPI  Chief Complaint  Patient presents with  . Back Pain    Patient returns to office today to follow up for DDD of the lumbar. Patient reports that she has had numbness and sharp pain in her back, patient reports that pain medication has helped but she is still in pain.  Reports she has f/u with neurosurgery next week for review of her latest MRI. Reports compliance with Lyrica 50 mg.twice daily which has helped a bit with her radiating pain. Has taken oxycodone 5 gm. Two pills 4 x day to keep her back pain manageable. I have discussed higher risk of respiratory compromise with clonazepam. She has agreed to decreased use to 1/2 pill prn and not schedule as currently prescribed.    Review of Systems     Objective:   Physical Exam  Constitutional: She appears well-developed and well-nourished.  Psychiatric: She has a normal mood and affect. Her behavior is normal.       Assessment:    1. DDD (degenerative disc disease), lumbar - Oxycodone HCl 10 MG TABS; One pill every 6 hours as needed for pain.  Dispense: 56 tablet; Refill: 0  2. Neuritis or radiculitis due to rupture of lumbar intervertebral disc - Oxycodone HCl 10 MG TABS; One pill every 6 hours as needed for pain.  Dispense: 56 tablet; Refill: 0    Plan:    Decrease clonazepam to 1/2 pill bid prn. Will f/u in two weeks after neurosurgery appointment.

## 2016-07-17 ENCOUNTER — Encounter: Payer: Self-pay | Admitting: Family Medicine

## 2016-07-21 DIAGNOSIS — R42 Dizziness and giddiness: Secondary | ICD-10-CM | POA: Diagnosis not present

## 2016-07-21 DIAGNOSIS — Z87891 Personal history of nicotine dependence: Secondary | ICD-10-CM | POA: Diagnosis not present

## 2016-07-21 DIAGNOSIS — R140 Abdominal distension (gaseous): Secondary | ICD-10-CM | POA: Diagnosis not present

## 2016-07-21 DIAGNOSIS — M549 Dorsalgia, unspecified: Secondary | ICD-10-CM | POA: Diagnosis not present

## 2016-07-21 DIAGNOSIS — R0602 Shortness of breath: Secondary | ICD-10-CM | POA: Diagnosis not present

## 2016-07-21 DIAGNOSIS — Z9884 Bariatric surgery status: Secondary | ICD-10-CM | POA: Diagnosis not present

## 2016-07-21 DIAGNOSIS — R509 Fever, unspecified: Secondary | ICD-10-CM | POA: Diagnosis not present

## 2016-07-21 DIAGNOSIS — R0789 Other chest pain: Secondary | ICD-10-CM | POA: Diagnosis not present

## 2016-07-21 DIAGNOSIS — M546 Pain in thoracic spine: Secondary | ICD-10-CM | POA: Diagnosis not present

## 2016-07-21 DIAGNOSIS — Z9049 Acquired absence of other specified parts of digestive tract: Secondary | ICD-10-CM | POA: Diagnosis not present

## 2016-07-21 DIAGNOSIS — R1013 Epigastric pain: Secondary | ICD-10-CM | POA: Diagnosis not present

## 2016-07-21 DIAGNOSIS — R079 Chest pain, unspecified: Secondary | ICD-10-CM | POA: Diagnosis not present

## 2016-07-22 DIAGNOSIS — M546 Pain in thoracic spine: Secondary | ICD-10-CM | POA: Diagnosis not present

## 2016-07-22 DIAGNOSIS — R1084 Generalized abdominal pain: Secondary | ICD-10-CM | POA: Diagnosis not present

## 2016-07-22 DIAGNOSIS — R1013 Epigastric pain: Secondary | ICD-10-CM | POA: Diagnosis not present

## 2016-07-22 DIAGNOSIS — R42 Dizziness and giddiness: Secondary | ICD-10-CM | POA: Diagnosis not present

## 2016-07-22 DIAGNOSIS — R140 Abdominal distension (gaseous): Secondary | ICD-10-CM | POA: Diagnosis not present

## 2016-07-22 DIAGNOSIS — Z9884 Bariatric surgery status: Secondary | ICD-10-CM | POA: Diagnosis not present

## 2016-07-22 DIAGNOSIS — R0602 Shortness of breath: Secondary | ICD-10-CM | POA: Diagnosis not present

## 2016-07-22 DIAGNOSIS — R509 Fever, unspecified: Secondary | ICD-10-CM | POA: Diagnosis not present

## 2016-07-22 DIAGNOSIS — Z87891 Personal history of nicotine dependence: Secondary | ICD-10-CM | POA: Diagnosis not present

## 2016-07-22 DIAGNOSIS — Z9049 Acquired absence of other specified parts of digestive tract: Secondary | ICD-10-CM | POA: Diagnosis not present

## 2016-07-22 DIAGNOSIS — R7989 Other specified abnormal findings of blood chemistry: Secondary | ICD-10-CM | POA: Diagnosis not present

## 2016-07-22 DIAGNOSIS — K838 Other specified diseases of biliary tract: Secondary | ICD-10-CM | POA: Diagnosis not present

## 2016-07-25 ENCOUNTER — Ambulatory Visit (INDEPENDENT_AMBULATORY_CARE_PROVIDER_SITE_OTHER): Payer: 59 | Admitting: Family Medicine

## 2016-07-25 ENCOUNTER — Encounter: Payer: Self-pay | Admitting: Family Medicine

## 2016-07-25 VITALS — BP 100/40 | HR 81 | Temp 97.9°F | Resp 16 | Wt 227.0 lb

## 2016-07-25 DIAGNOSIS — M5116 Intervertebral disc disorders with radiculopathy, lumbar region: Secondary | ICD-10-CM | POA: Diagnosis not present

## 2016-07-25 DIAGNOSIS — M5136 Other intervertebral disc degeneration, lumbar region: Secondary | ICD-10-CM

## 2016-07-25 MED ORDER — OXYCODONE HCL 10 MG PO TABS: ORAL_TABLET | ORAL | 0 refills | Status: DC

## 2016-07-25 NOTE — Progress Notes (Signed)
Subjective:     Patient ID: Misty Zamora, female   DOB: 08-28-1975, 41 y.o.   MRN: [id number]  HPI  Chief Complaint  Patient presents with  . Back Pain    Patient returns to office today for two week follow up, last office visit was 07/09/16 patient was continued on oxycodone HCI 10mg . Patient did follow up with neuro surgeon, who suggested spinal cord stimulator.    She is pending CT myelogram 1/27. Also has seen her [facility] specialist, Dr.Jowell, who has recommended amitriptyline for her chronic abdominal pain. She is reluctant to use it but I suggested to try it for a few weeks as directed to help with central sensitization pain.   Review of Systems     Objective:   Physical Exam  Constitutional: She appears well-developed and well-nourished. No distress.  Psychiatric: She has a normal mood and affect. Her behavior is normal.       Assessment:    1. Neuritis or radiculitis due to rupture of lumbar intervertebral disc - Oxycodone HCl 10 MG TABS; One pill every 4 hours as needed for pain. Not to exceed 6 pills in a 24 hour period.  Dispense: 180 tablet; Refill: 0  2. DDD (degenerative disc disease), lumbar - Oxycodone HCl 10 MG TABS; One pill every 4 hours as needed for pain. Not to exceed 6 pills in a 24 hour period.  Dispense: 180 tablet; Refill: 0    Plan:    F/u with neurosurgery and G.I.as scheduled. Phone/e-mail f/u in 4 weeks.

## 2016-07-25 NOTE — Patient Instructions (Signed)
Phone/e-mail follow up in 4 weeks at time of refill. Consider strongly trying the amitriptyline for a few weeks.

## 2016-07-30 DIAGNOSIS — M5416 Radiculopathy, lumbar region: Secondary | ICD-10-CM | POA: Diagnosis not present

## 2016-07-30 DIAGNOSIS — M48061 Spinal stenosis, lumbar region without neurogenic claudication: Secondary | ICD-10-CM | POA: Diagnosis not present

## 2016-07-30 DIAGNOSIS — R1084 Generalized abdominal pain: Secondary | ICD-10-CM | POA: Diagnosis not present

## 2016-07-30 DIAGNOSIS — M5116 Intervertebral disc disorders with radiculopathy, lumbar region: Secondary | ICD-10-CM | POA: Diagnosis not present

## 2016-07-30 DIAGNOSIS — M4056 Lordosis, unspecified, lumbar region: Secondary | ICD-10-CM | POA: Diagnosis not present

## 2016-07-30 DIAGNOSIS — M5117 Intervertebral disc disorders with radiculopathy, lumbosacral region: Secondary | ICD-10-CM | POA: Diagnosis not present

## 2016-07-30 DIAGNOSIS — Z9884 Bariatric surgery status: Secondary | ICD-10-CM | POA: Diagnosis not present

## 2016-08-05 DIAGNOSIS — H5203 Hypermetropia, bilateral: Secondary | ICD-10-CM | POA: Diagnosis not present

## 2016-08-05 DIAGNOSIS — H5043 Accommodative component in esotropia: Secondary | ICD-10-CM | POA: Diagnosis not present

## 2016-08-05 DIAGNOSIS — H52211 Irregular astigmatism, right eye: Secondary | ICD-10-CM | POA: Diagnosis not present

## 2016-08-07 DIAGNOSIS — Z8669 Personal history of other diseases of the nervous system and sense organs: Secondary | ICD-10-CM | POA: Diagnosis not present

## 2016-08-07 DIAGNOSIS — E161 Other hypoglycemia: Secondary | ICD-10-CM | POA: Diagnosis not present

## 2016-08-07 DIAGNOSIS — Z8639 Personal history of other endocrine, nutritional and metabolic disease: Secondary | ICD-10-CM | POA: Diagnosis not present

## 2016-08-07 DIAGNOSIS — E559 Vitamin D deficiency, unspecified: Secondary | ICD-10-CM | POA: Diagnosis not present

## 2016-08-07 DIAGNOSIS — Z9884 Bariatric surgery status: Secondary | ICD-10-CM | POA: Diagnosis not present

## 2016-08-07 DIAGNOSIS — N2581 Secondary hyperparathyroidism of renal origin: Secondary | ICD-10-CM | POA: Diagnosis not present

## 2016-08-12 ENCOUNTER — Ambulatory Visit: Payer: 59 | Admitting: Family Medicine

## 2016-08-13 DIAGNOSIS — Z8639 Personal history of other endocrine, nutritional and metabolic disease: Secondary | ICD-10-CM | POA: Diagnosis not present

## 2016-08-13 DIAGNOSIS — E161 Other hypoglycemia: Secondary | ICD-10-CM | POA: Diagnosis not present

## 2016-08-16 ENCOUNTER — Encounter: Payer: Self-pay | Admitting: Family Medicine

## 2016-08-16 ENCOUNTER — Ambulatory Visit (INDEPENDENT_AMBULATORY_CARE_PROVIDER_SITE_OTHER): Payer: 59 | Admitting: Family Medicine

## 2016-08-16 VITALS — BP 108/64 | HR 64 | Temp 98.4°F | Resp 16 | Wt 222.0 lb

## 2016-08-16 DIAGNOSIS — F439 Reaction to severe stress, unspecified: Secondary | ICD-10-CM | POA: Diagnosis not present

## 2016-08-16 DIAGNOSIS — F419 Anxiety disorder, unspecified: Secondary | ICD-10-CM

## 2016-08-16 MED ORDER — PAROXETINE HCL 10 MG PO TABS: 10.0000 mg | ORAL_TABLET | Freq: Every day | ORAL | 1 refills | Status: DC

## 2016-08-16 MED ORDER — HYDROXYZINE HCL 25 MG PO TABS: 25.0000 mg | ORAL_TABLET | Freq: Three times a day (TID) | ORAL | 0 refills | Status: DC | PRN

## 2016-08-16 NOTE — Progress Notes (Signed)
Subjective:     Patient ID: Misty Zamora, female   DOB: 1976-05-21, 41 y.o.   MRN: [id number]  HPI  Chief Complaint  Patient presents with  . Anxiety    Worsening in the last week; especially in the last few days.   States she was left go from her job with grievance hearing pending. Very upset over this with increased frequency of panic attacks. I had decreased dose of clonazepam due to use of high dose opiates. 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 Denies that she is suicidal. She has had recent evaluation per endocrine and has thyroid ultrasound results pending. Was also found to have low B12, Vitamin D, and ferritin levels.   Review of Systems     Objective:   Physical Exam  Constitutional: She appears well-developed and well-nourished. She appears distressed (tearful today).  Makes good eye contact, tearful during office visit.     Assessment:    1. Anxiety - PARoxetine (PAXIL) 10 MG tablet; Take 1 tablet (10 mg total) by mouth daily. Start at 1/2 pill for the first 7 days then go to a whole pill  Dispense: 30 tablet; Refill: 1 - hydrOXYzine (ATARAX/VISTARIL) 25 MG tablet; Take 1 tablet (25 mg total) by mouth 3 (three) times daily as needed for anxiety.  Dispense: 30 tablet; Refill: 0  2. Situational stress - PARoxetine (PAXIL) 10 MG tablet; Take 1 tablet (10 mg total) by mouth daily. Start at 1/2 pill for the first 7 days then go to a whole pill  Dispense: 30 tablet; Refill: 1     Plan:    Further f/u in two weeks.

## 2016-08-16 NOTE — Addendum Note (Signed)
Addended by: Ashley Royalty E on: 08/16/2016 02:56 PM   Modules accepted: Orders

## 2016-08-16 NOTE — Patient Instructions (Signed)
Start paroxetine at 1/2 pill daily. See me in two weeks

## 2016-08-19 ENCOUNTER — Telehealth: Payer: Self-pay

## 2016-08-19 ENCOUNTER — Other Ambulatory Visit: Payer: Self-pay | Admitting: Family Medicine

## 2016-08-19 DIAGNOSIS — M5136 Other intervertebral disc degeneration, lumbar region: Secondary | ICD-10-CM

## 2016-08-19 DIAGNOSIS — E538 Deficiency of other specified B group vitamins: Secondary | ICD-10-CM | POA: Diagnosis not present

## 2016-08-19 DIAGNOSIS — M5116 Intervertebral disc disorders with radiculopathy, lumbar region: Secondary | ICD-10-CM

## 2016-08-19 MED ORDER — OXYCODONE HCL 10 MG PO TABS: ORAL_TABLET | ORAL | 0 refills | Status: DC

## 2016-08-19 NOTE — Telephone Encounter (Signed)
Patient advised as directed below.  Thanks,  -Mayrene Bastarache 

## 2016-08-19 NOTE — Telephone Encounter (Signed)
Not due for refill before 3/24. Will put the prescription out for her to pickup but can't be refilled until then.

## 2016-08-19 NOTE — Telephone Encounter (Signed)
Patient is requesting refill on the following medication: Oxycodone HCl 10 MG TABS  She reports that she took her last pill today and if possible can the prescription be ready this afternoon.  Last Rx was written:07/25/2016 with Qty:180

## 2016-08-21 ENCOUNTER — Telehealth: Payer: Self-pay

## 2016-08-21 NOTE — Telephone Encounter (Signed)
Patient called office today with concerns about reaction to Hydroxyzine that was prescribed on 08/16/16. Patient states since starting drug she has experienced bone pain, she states that her anxiety is very high and has been having up to 4-5 panic attacks a day. Patient states that Mikki Santee had prescribed medication with Clonazepam to help with anxiety. Patient has not been taking her clonazepam prescription as prescribed, she states that she is take (4) 1mg  tablets daily, she stated on phone that she takes Benadryl every night so she can sleep. Patient was very anxious on phone, I advised her that Mikki Santee is out of office this afternoon she urged that another doctor look at her chart today and advise.

## 2016-08-22 ENCOUNTER — Other Ambulatory Visit: Payer: Self-pay | Admitting: Family Medicine

## 2016-08-22 DIAGNOSIS — F419 Anxiety disorder, unspecified: Secondary | ICD-10-CM

## 2016-08-22 NOTE — Telephone Encounter (Signed)
States her mother has recurrent breast cancer and this has compounded her anxiety from her recent job loss. Suggested she access [facility] but she prefers a psychiatry referral. She is also having nocturnal "bone/muscle pain" throughout her body at night. ? Somatization though she does have mild Vitamin D deficiency. May also try increasing hydroxyzine to 50 mg.

## 2016-08-27 ENCOUNTER — Telehealth: Payer: Self-pay | Admitting: Family Medicine

## 2016-08-27 NOTE — Telephone Encounter (Signed)
She was due to be seen later this week for anxiety but we can see her today  for her current symptoms as well.

## 2016-08-27 NOTE — Telephone Encounter (Signed)
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 Please advise. Thanks TNP

## 2016-08-27 NOTE — Telephone Encounter (Signed)
Patient has declined coming into office for appointment she states that she is in to much pain to move and will wait to here back from her Endocrinologist.

## 2016-08-27 NOTE — Telephone Encounter (Signed)
Patient states that she has had sever neck pain for the past 3 days on the right side of her neck. Patient states "it feels like a ball is there." Patient states that she was seen at [facility] endocrinology about 2-3 weeks ago and they had mentioned to her that they noticed she had two large lymph nodes. Patient states that she is unsure if this is related or not, she has contacted endocrinology but still awaiting response. Patient describes pain as throbbing and radiating to the right side of her head causing pressure. Patient states pain is constant. Associated with neck pain patient reports nausea and vomiting, she states that she has been taking her pain meds and applying heating pad to area. Patient reports that heating pad gives her some relief but not for long. Please advise. KW

## 2016-08-28 ENCOUNTER — Ambulatory Visit: Payer: 59 | Admitting: Family Medicine

## 2016-08-29 ENCOUNTER — Ambulatory Visit: Payer: 59 | Admitting: Family Medicine

## 2016-08-29 ENCOUNTER — Other Ambulatory Visit: Payer: Self-pay | Admitting: Family Medicine

## 2016-09-23 ENCOUNTER — Telehealth: Payer: Self-pay | Admitting: Family Medicine

## 2016-09-23 ENCOUNTER — Other Ambulatory Visit: Payer: Self-pay | Admitting: Family Medicine

## 2016-09-23 DIAGNOSIS — M5136 Other intervertebral disc degeneration, lumbar region: Secondary | ICD-10-CM

## 2016-09-23 DIAGNOSIS — M5116 Intervertebral disc disorders with radiculopathy, lumbar region: Secondary | ICD-10-CM

## 2016-09-23 MED ORDER — OXYCODONE HCL 10 MG PO TABS
ORAL_TABLET | ORAL | 0 refills | Status: DC
Start: 2016-09-23 — End: 2016-10-22

## 2016-09-23 NOTE — Telephone Encounter (Signed)
Spoke with patient on the phone and advised her of prescription. Patient states that she is compliant with taking Paxil and has been tolerating drug well and has had fair symptom control.

## 2016-09-23 NOTE — Telephone Encounter (Signed)
Oxycodone up front for pickup. Is she taking the paroxetine (Paxil)?

## 2016-09-23 NOTE — Telephone Encounter (Signed)
Please review. KW 

## 2016-09-23 NOTE — Telephone Encounter (Signed)
Pt needs   Oxycodone HCl 10 MG TABS   08/19/16 -- Carmon Ginsberg, PA   One pill every 4 hours as needed for pain. Not to exceed 6 pills in a 24 hour      Thanks teri

## 2016-10-15 ENCOUNTER — Other Ambulatory Visit: Payer: Self-pay | Admitting: Cardiovascular Disease

## 2016-10-21 ENCOUNTER — Other Ambulatory Visit: Payer: Self-pay | Admitting: Family Medicine

## 2016-10-21 DIAGNOSIS — F439 Reaction to severe stress, unspecified: Secondary | ICD-10-CM

## 2016-10-21 DIAGNOSIS — F419 Anxiety disorder, unspecified: Secondary | ICD-10-CM

## 2016-10-21 MED ORDER — PAROXETINE HCL 20 MG PO TABS: 20.0000 mg | ORAL_TABLET | Freq: Every day | ORAL | 2 refills | Status: DC

## 2016-10-21 NOTE — Telephone Encounter (Signed)
Please review-aa 

## 2016-10-21 NOTE — Telephone Encounter (Signed)
[id number] 

## 2016-10-21 NOTE — Telephone Encounter (Signed)
How is she doing on the paroxetine 10 mg? If her anxiety/stress is not under good control we can go up on the dose

## 2016-10-21 NOTE — Telephone Encounter (Signed)
Patient states that she has been tolerating paxil well but she says with symptom control for her anxiety she has not noticed any improvement. Patient states that she has 4 episodes a day of anxiety. KW

## 2016-10-22 ENCOUNTER — Other Ambulatory Visit: Payer: Self-pay | Admitting: Family Medicine

## 2016-10-22 ENCOUNTER — Telehealth: Payer: Self-pay

## 2016-10-22 DIAGNOSIS — M5116 Intervertebral disc disorders with radiculopathy, lumbar region: Secondary | ICD-10-CM

## 2016-10-22 DIAGNOSIS — M5136 Other intervertebral disc degeneration, lumbar region: Secondary | ICD-10-CM

## 2016-10-22 MED ORDER — OXYCODONE HCL 10 MG PO TABS
ORAL_TABLET | ORAL | 0 refills | Status: DC
Start: 2016-10-22 — End: 2016-11-22

## 2016-10-22 NOTE — Telephone Encounter (Signed)
[id number] 

## 2016-10-22 NOTE — Telephone Encounter (Signed)
Patient advised as below.  

## 2016-10-22 NOTE — Telephone Encounter (Signed)
-----   Message from Carmon Ginsberg, [place] sent at 10/22/2016  7:42 AM EDT ----- Oxycodone rx available for pickup. May fill 5/24.

## 2016-11-22 ENCOUNTER — Other Ambulatory Visit: Payer: Self-pay | Admitting: Family Medicine

## 2016-11-22 DIAGNOSIS — M5116 Intervertebral disc disorders with radiculopathy, lumbar region: Secondary | ICD-10-CM

## 2016-11-22 DIAGNOSIS — M5136 Other intervertebral disc degeneration, lumbar region: Secondary | ICD-10-CM

## 2016-11-22 MED ORDER — OXYCODONE HCL 10 MG PO TABS: ORAL_TABLET | ORAL | 0 refills | Status: DC

## 2016-11-22 NOTE — Telephone Encounter (Signed)
Please review and advise. KW 

## 2016-11-22 NOTE — Telephone Encounter (Signed)
Pt advised, patient states she is not working yet. She has been on interviews but has not been hired yet-aa

## 2016-11-22 NOTE — Telephone Encounter (Signed)
Oxycodone rx is ready for pickup. Ask her if she is working again.

## 2016-11-22 NOTE — Telephone Encounter (Signed)
Pt contacted office for refill request on the following medications:  Oxycodone HCl 10 MG TABS.   AQ#[phone]

## 2016-11-27 ENCOUNTER — Other Ambulatory Visit: Payer: Self-pay | Admitting: Family Medicine

## 2016-11-27 DIAGNOSIS — F419 Anxiety disorder, unspecified: Secondary | ICD-10-CM

## 2016-11-28 ENCOUNTER — Other Ambulatory Visit: Payer: Self-pay | Admitting: Family Medicine

## 2016-11-28 MED ORDER — CLONAZEPAM 0.5 MG PO TABS: 0.5000 mg | ORAL_TABLET | Freq: Two times a day (BID) | ORAL | 0 refills | Status: DC | PRN

## 2016-11-28 NOTE — Progress Notes (Signed)
Prescription for Clonazepam has been called into pharmacy. KW

## 2016-11-29 ENCOUNTER — Other Ambulatory Visit: Payer: Self-pay | Admitting: Family Medicine

## 2016-11-29 NOTE — Telephone Encounter (Signed)
Pt called requesting information about her refill request for clonazePAM (KLONOPIN) 0.5 MG tablet that was received on 11/27/16. Looks like the refill was approved yesterday 11/28/16 for a phone in. [facility] [address] [facility] office [facility]'t responded to the request. Please advise. Thanks TNP

## 2016-11-29 NOTE — Telephone Encounter (Signed)
Per kat's note RX was [facility]-aa

## 2016-12-02 NOTE — Telephone Encounter (Signed)
I spoke with pharmacist on phone 11/29/16 who stated they did not get voicemail I left on 11/28/16 for prescription, verbally gave order for prescription on phone and was confirmed it was being filled that day. KW

## 2016-12-17 ENCOUNTER — Other Ambulatory Visit: Payer: Self-pay | Admitting: Family Medicine

## 2016-12-17 ENCOUNTER — Telehealth: Payer: Self-pay | Admitting: Family Medicine

## 2016-12-17 DIAGNOSIS — F410 Panic disorder [episodic paroxysmal anxiety] without agoraphobia: Secondary | ICD-10-CM

## 2016-12-17 MED ORDER — PAROXETINE HCL 40 MG PO TABS: 40.0000 mg | ORAL_TABLET | Freq: Every day | ORAL | 2 refills | Status: DC

## 2016-12-17 MED ORDER — CLONAZEPAM 0.5 MG PO TABS: 0.5000 mg | ORAL_TABLET | Freq: Two times a day (BID) | ORAL | 0 refills | Status: DC | PRN

## 2016-12-17 NOTE — Telephone Encounter (Signed)
I am not willing to increase the clonazepam. I added the paroxetine (Paxil) to help control stress and panic attacks. Is she taking the paroxetine daily?

## 2016-12-17 NOTE — Telephone Encounter (Signed)
Spoke with patient and advised she states that she has been taking the Paxil daily and it has helped her symptoms some. Patient states that when she was on the Clonazepam 1mg  her anxiety was controlled and now on the 0.5mg  she states that she experiences anxiety everyday. I asked patient if she had any new stressors in her life or any changes and she denied, patient states that she is still applying for jobs and one is look potential that she might get. KW

## 2016-12-17 NOTE — Telephone Encounter (Signed)
Please advise what you would like me to advise patient, we changed Rx dose last time due to opiate use ( it was stated in comments to pharmacist). KW

## 2016-12-17 NOTE — Telephone Encounter (Signed)
Pt call saying that her rx for clonazepam was changed to .5 mg and she use to take 1mg .  She needs to take the 1mg  to control her anxiety.  She needs the rx changed back and will need a refill sooner because of the dosing.  [facility] [address]  thanks [employer]

## 2016-12-17 NOTE — Telephone Encounter (Signed)
Called patient and notified her and called in prescription to [address]

## 2016-12-17 NOTE — Telephone Encounter (Signed)
I will increase her paroxetine to 40 mg daily. She may double up on her current 20 mg supply. Remember it takes 2-4 weeks to get full benefit from the dose change.

## 2016-12-25 ENCOUNTER — Telehealth: Payer: Self-pay | Admitting: Family Medicine

## 2016-12-25 NOTE — Telephone Encounter (Signed)
Pt needs refill on her oxycodone.  Please call when ready  Thanks Con Memos

## 2016-12-26 ENCOUNTER — Other Ambulatory Visit: Payer: Self-pay | Admitting: Family Medicine

## 2016-12-26 DIAGNOSIS — M5136 Other intervertebral disc degeneration, lumbar region: Secondary | ICD-10-CM

## 2016-12-26 DIAGNOSIS — M5116 Intervertebral disc disorders with radiculopathy, lumbar region: Secondary | ICD-10-CM

## 2016-12-26 MED ORDER — OXYCODONE HCL 10 MG PO TABS: ORAL_TABLET | ORAL | 0 refills | Status: DC

## 2016-12-26 NOTE — Telephone Encounter (Signed)
Left message for patient letting her know that Rx is available for pick up. KW

## 2016-12-26 NOTE — Telephone Encounter (Signed)
Oxycodone rx up front for pickup

## 2017-01-15 ENCOUNTER — Other Ambulatory Visit: Payer: Self-pay | Admitting: Family Medicine

## 2017-01-16 ENCOUNTER — Other Ambulatory Visit: Payer: Self-pay | Admitting: Family Medicine

## 2017-01-16 ENCOUNTER — Telehealth: Payer: Self-pay | Admitting: Family Medicine

## 2017-01-16 MED ORDER — CLONAZEPAM 0.5 MG PO TABS
0.5000 mg | ORAL_TABLET | Freq: Two times a day (BID) | ORAL | 0 refills | Status: DC | PRN
Start: 2017-01-16 — End: 2017-02-19

## 2017-01-16 NOTE — Telephone Encounter (Signed)
Clarify if she [facility] at [facility] or [facility] where we sent it previously.

## 2017-01-16 NOTE — Telephone Encounter (Signed)
The medication @ 40 mg. was sent in on 7/17 with 2 refills to [address]. If that is where she got the medication take it back and have them change it. Also look at the bottle and see if it says take two 20 mg. If they could not get the 40 mg. Tablet.

## 2017-01-16 NOTE — Progress Notes (Signed)
prescription called into pharmacy. KW 

## 2017-01-16 NOTE — Telephone Encounter (Signed)
Pt called about the increase in the dosage of her Paxil from 20 to 40 mg.  When she picked it up it was still 20 mg.  Please advise  [phone]  Thanks Misty Zamora

## 2017-01-16 NOTE — Telephone Encounter (Signed)
Pt [facility] [address]  [employer]

## 2017-01-16 NOTE — Telephone Encounter (Signed)
Please review. KW 

## 2017-01-17 NOTE — Telephone Encounter (Signed)
Spoke with pharmacist and patient and she had it filled at [facility], for further prescriptions she [facility]. Misty Zamora

## 2017-01-17 NOTE — Telephone Encounter (Signed)
Rx has been called into [facility], pharmacist states patient picked up prescription on 01/16/17. KW

## 2017-01-17 NOTE — Telephone Encounter (Signed)
[  please review-aa 

## 2017-01-17 NOTE — Telephone Encounter (Signed)
We just refilled this-where did you call it in to? If [facility]-Cancel that and [facility].

## 2017-01-17 NOTE — Telephone Encounter (Signed)
[id number]-aa 

## 2017-01-18 ENCOUNTER — Emergency Department
Admission: EM | Admit: 2017-01-18 | Discharge: 2017-01-19 | Disposition: A | Discharge status: Home / Self Care | Payer: Self-pay | Attending: Emergency Medicine | Admitting: Emergency Medicine

## 2017-01-18 ENCOUNTER — Encounter: Payer: Self-pay | Admitting: Emergency Medicine

## 2017-01-18 DIAGNOSIS — Z87891 Personal history of nicotine dependence: Secondary | ICD-10-CM | POA: Insufficient documentation

## 2017-01-18 DIAGNOSIS — R1011 Right upper quadrant pain: Secondary | ICD-10-CM | POA: Diagnosis not present

## 2017-01-18 DIAGNOSIS — Z79899 Other long term (current) drug therapy: Secondary | ICD-10-CM | POA: Insufficient documentation

## 2017-01-18 DIAGNOSIS — R1010 Upper abdominal pain, unspecified: Secondary | ICD-10-CM | POA: Insufficient documentation

## 2017-01-18 DIAGNOSIS — I10 Essential (primary) hypertension: Secondary | ICD-10-CM | POA: Insufficient documentation

## 2017-01-18 DIAGNOSIS — I880 Nonspecific mesenteric lymphadenitis: Secondary | ICD-10-CM | POA: Diagnosis not present

## 2017-01-18 DIAGNOSIS — R109 Unspecified abdominal pain: Secondary | ICD-10-CM

## 2017-01-18 LAB — CBC
HCT: 38.7 % (ref 35.0–47.0)
Hemoglobin: 13.5 g/dL (ref 12.0–16.0)
MCH: 28.9 pg (ref 26.0–34.0)
MCHC: 34.8 g/dL (ref 32.0–36.0)
MCV: 83.1 fL (ref 80.0–100.0)
PLATELETS: 230 10*3/uL (ref 150–440)
RBC: 4.65 MIL/uL (ref 3.80–5.20)
RDW: 14.6 % — ABNORMAL HIGH (ref 11.5–14.5)
WBC: 5.7 10*3/uL (ref 3.6–11.0)

## 2017-01-18 LAB — URINALYSIS, COMPLETE (UACMP) WITH MICROSCOPIC
Bacteria, UA: NONE SEEN
Bilirubin Urine: NEGATIVE
GLUCOSE, UA: NEGATIVE mg/dL
Hgb urine dipstick: NEGATIVE
Ketones, ur: NEGATIVE mg/dL
Leukocytes, UA: NEGATIVE
Nitrite: NEGATIVE
PH: 6 (ref 5.0–8.0)
Protein, ur: NEGATIVE mg/dL
Specific Gravity, Urine: 1.017 (ref 1.005–1.030)

## 2017-01-18 LAB — COMPREHENSIVE METABOLIC PANEL
ALK PHOS: 58 U/L (ref 38–126)
ALT: 17 U/L (ref 14–54)
AST: 24 U/L (ref 15–41)
Albumin: 4 g/dL (ref 3.5–5.0)
Anion gap: 7 (ref 5–15)
BUN: 10 mg/dL (ref 6–20)
CHLORIDE: 105 mmol/L (ref 101–111)
CO2: 27 mmol/L (ref 22–32)
CREATININE: 0.66 mg/dL (ref 0.44–1.00)
Calcium: 9.2 mg/dL (ref 8.9–10.3)
GFR calc Af Amer: >60 mL/min (ref >60)
GFR calc non Af Amer: >60 mL/min (ref >60)
Glucose, Bld: 97 mg/dL (ref 65–99)
Potassium: 4 mmol/L (ref 3.5–5.1)
SODIUM: 139 mmol/L (ref 135–145)
Total Bilirubin: 0.5 mg/dL (ref 0.3–1.2)
Total Protein: 7.3 g/dL (ref 6.5–8.1)

## 2017-01-18 LAB — LIPASE, BLOOD: LIPASE: 28 U/L (ref 11–51)

## 2017-01-18 MED ORDER — ONDANSETRON 4 MG PO TBDP
4.0000 mg | ORAL_TABLET | Freq: Once | ORAL | Status: AC | PRN
  Administered 2017-01-18: 4 mg via ORAL
  Filled 2017-01-18: qty 1

## 2017-01-18 NOTE — ED Triage Notes (Signed)
Pt reports dull pain to right upper quadrant for 3 days, sharp pains intermittently; dull pain is constant; nausea but no vomiting; pt with history of pancreatitis as well as gall stones lodged in bile duct; history of cholecystectomy but has had problems since; pt ambulatory with steady gait; talking in complete coherent sentences

## 2017-01-19 ENCOUNTER — Emergency Department: Payer: Self-pay

## 2017-01-19 MED ORDER — SODIUM CHLORIDE 0.9 % IV BOLUS (SEPSIS)
1000.0000 mL | Freq: Once | INTRAVENOUS | Status: AC
Start: 2017-01-19 — End: 2017-01-19
  Administered 2017-01-19: 1000 mL via INTRAVENOUS

## 2017-01-19 MED ORDER — KETOROLAC TROMETHAMINE 30 MG/ML IJ SOLN
30.0000 mg | Freq: Once | INTRAMUSCULAR | Status: AC
  Administered 2017-01-19: 30 mg via INTRAVENOUS
  Filled 2017-01-19: qty 1

## 2017-01-19 MED ORDER — ETODOLAC 200 MG PO CAPS: 200.0000 mg | ORAL_CAPSULE | Freq: Three times a day (TID) | ORAL | 0 refills | Status: DC

## 2017-01-19 MED ORDER — HYDROMORPHONE HCL 1 MG/ML PO LIQD: 1.0000 mg | Freq: Once | ORAL | Status: DC

## 2017-01-19 MED ORDER — MORPHINE SULFATE (PF) 4 MG/ML IV SOLN
4.0000 mg | Freq: Once | INTRAVENOUS | Status: AC
  Administered 2017-01-19: 4 mg via INTRAVENOUS
  Filled 2017-01-19: qty 1

## 2017-01-19 MED ORDER — HYDROMORPHONE HCL 1 MG/ML IJ SOLN
1.0000 mg | Freq: Once | INTRAMUSCULAR | Status: AC
  Administered 2017-01-19: 1 mg via INTRAVENOUS

## 2017-01-19 MED ORDER — ONDANSETRON 4 MG PO TBDP: 4.0000 mg | ORAL_TABLET | Freq: Three times a day (TID) | ORAL | 0 refills | Status: DC | PRN

## 2017-01-19 MED ORDER — ONDANSETRON HCL 4 MG/2ML IJ SOLN
4.0000 mg | Freq: Once | INTRAMUSCULAR | Status: AC
  Administered 2017-01-19: 4 mg via INTRAVENOUS
  Filled 2017-01-19: qty 2

## 2017-01-19 MED ORDER — HYDROMORPHONE HCL 1 MG/ML IJ SOLN
INTRAMUSCULAR | Status: AC
  Administered 2017-01-19: 1 mg via INTRAVENOUS
  Filled 2017-01-19: qty 1

## 2017-01-19 MED ORDER — GI COCKTAIL ~~LOC~~
30.0000 mL | Freq: Once | ORAL | Status: AC
  Administered 2017-01-19: 30 mL via ORAL
  Filled 2017-01-19: qty 30

## 2017-01-19 MED ORDER — SUCRALFATE 1 G PO TABS
1.0000 g | ORAL_TABLET | Freq: Once | ORAL | Status: AC
  Administered 2017-01-19: 1 g via ORAL
  Filled 2017-01-19: qty 1

## 2017-01-19 NOTE — ED Notes (Signed)
Pt taken to [facility]

## 2017-01-19 NOTE — ED Provider Notes (Signed)
[facility] Emergency Department Provider Note   ____________________________________________   First MD Initiated Contact with Patient 01/19/17 0015     (approximate)  I have reviewed the triage vital signs and the nursing notes.   HISTORY  Chief Complaint Abdominal Pain    HPI Misty Zamora is a 41 y.o. female who comes into the hospital today with some epigastric pain and right upper quadrant pain. The patient reports that anytime she moves she has pain in her right upper quadrant and in her side. The patient reports that she's had a cholecystectomy but she's had problems with stones stuck in her duct in the past. The patient reports that she takes oxycodone regularly but has not been touching the pain. She reports that she hurts in her abdomen when she breathes in and out. The patient endorses some nausea with no vomiting. She does have a history of gastric bypass that she reports that she is unable to vomit. The patient states her pain is 8 out of 10 in intensity. She denies any fevers or any greasy foods and the recent past. The patient does have a history of pancreatitis. The patient decided to come into the hospital today for evaluation. She has no shortness of breath or any chest pain.   Past Medical History:  Diagnosis Date  . Anemia    had iron infusions. last one in Sept. told levels are now normal  . Anxiety   . 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 Bulging lumbar disc    causes bilateral leg pain  . Depression   . Family history of adverse reaction to anesthesia    Father - PONV  . GERD (gastroesophageal reflux disease)    occasional  . Heart murmur 2005  . History of cardiovascular stress test 2005   showed MR and TR  . History of kidney stones   . Hypertension    no meds since gastric bypass  . IDA (iron deficiency anemia)   . PONV (postoperative nausea and vomiting)    hypotension with epidural  . Sleep apnea    was  better after gastric bypass, hasd recently started snoring again.  . Wears contact lenses     Patient Active Problem List   Diagnosis Date Noted  . Abdominal pain, epigastric   . Bariatric surgery status   . DDD (degenerative disc disease), lumbar 10/03/2015  . Neuritis or radiculitis due to rupture of lumbar intervertebral disc 06/07/2015  . 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 Other intervertebral disc displacement, lumbar region 06/07/2015  . Obesity 04/07/2015  . Allergic rhinitis 02/18/2015  . Anxiety 02/18/2015  . H/O renal calculi 02/18/2015  . 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 Multinodular goiter 01/02/2015  . Status post laparoscopic hysterectomy 12/27/2014  . Chronic female pelvic pain 12/08/2014  . IDA (iron deficiency anemia) 12/01/2014  . Abdominal pain, chronic, epigastric 01/29/2012    Past Surgical History:  Procedure Laterality Date  . ABDOMINAL HYSTERECTOMY  12/2014   ovaries in situ  . BILATERAL SALPINGECTOMY Bilateral 12/27/2014   Procedure: BILATERAL SALPINGECTOMY;  Surgeon: Will Bonnet, MD;  Location: [facility];  Service: Gynecology;  Laterality: Bilateral;  . COLONOSCOPY  2010  . CYSTOSCOPY N/A 12/27/2014   Procedure: CYSTOSCOPY;  Surgeon: Will Bonnet, MD;  Location: [facility];  Service: Gynecology;  Laterality: N/A;  . DILATION AND CURETTAGE OF UTERUS    . ELBOW ARTHROSCOPY Left 03/30/2015   Procedure: ARTHROSCOPY ELBOW  WITH DEBRIDEMENT OF THE SYMPTOMATIC PLICA;  Surgeon: Corky Mull, MD;  Location: [place];  Service: Orthopedics;  Laterality: Left;  . ELBOW SURGERY Left   . ERCP    . ESOPHAGOGASTRODUODENOSCOPY (EGD) WITH PROPOFOL N/A 06/06/2016   Procedure: ESOPHAGOGASTRODUODENOSCOPY (EGD) WITH PROPOFOL;  Surgeon: [name], MD;  Location: [place];  Service: Endoscopy;  Laterality: N/A;  . gallbladder removed    . GASTRIC BYPASS  2012  . LAPAROSCOPIC HYSTERECTOMY N/A 12/27/2014   Procedure: HYSTERECTOMY  TOTAL LAPAROSCOPIC;  Surgeon: Will Bonnet, MD;  Location: [facility];  Service: Gynecology;  Laterality: N/A;  . laproscopy    . RENAL ENDOSCOPY VIA NEPHROSTOMY / PYELOSTOMY  2010  . TUBAL LIGATION      Prior to Admission medications   Medication Sig Start Date End Date Taking? Authorizing Provider  albuterol (PROVENTIL HFA;VENTOLIN HFA) 108 (90 Base) MCG/ACT inhaler Inhale 2 puffs into the lungs every 6 (six) hours as needed for wheezing or shortness of breath. 04/07/16  Yes Nance Pear, MD  clonazePAM (KLONOPIN) 0.5 MG tablet Take 1 tablet (0.5 mg total) by mouth 2 (two) times daily as needed for anxiety. 01/16/17  Yes Carmon Ginsberg, PA  Oxycodone HCl 10 MG TABS One pill every 4 hours as needed for pain. Not to exceed 6 pills in a 24 hour period. 12/26/16  Yes Carmon Ginsberg, PA  PARoxetine (PAXIL) 40 MG tablet Take 1 tablet (40 mg total) by mouth daily. 12/17/16  Yes Carmon Ginsberg, PA  etodolac (LODINE) 200 MG capsule Take 1 capsule (200 mg total) by mouth every 8 (eight) hours. 01/19/17   Loney Hering, MD  ondansetron (ZOFRAN ODT) 4 MG disintegrating tablet Take 1 tablet (4 mg total) by mouth every 8 (eight) hours as needed for nausea or vomiting. 01/19/17   Loney Hering, MD    Allergies Compazine [prochlorperazine edisylate] and Ketorolac  Family History  Problem Relation Age of Onset  . Hypertension Mother   . Hyperlipidemia Mother   . Diabetes Mother   . Cancer Mother        breast cancer; 04/2011  . Colon polyps Mother   . Breast cancer Mother 90  . Hyperlipidemia Father   . Hypertension Father   . Colon polyps Father   . Hypertension Sister   . Hypertension Brother   . Cancer Maternal Uncle        terminal lung cancer  . Breast cancer Maternal Aunt   . Breast cancer Paternal Aunt   . Breast cancer Paternal Grandmother   . Bladder Cancer Neg Hx   . Kidney cancer Neg Hx     Social History Social History  Substance Use Topics  . Smoking status:  Former Smoker    Packs/day: 0.00    Years: 8.00    Types: Cigarettes    Quit date: 06/04/1999  . Smokeless tobacco: Never Used  . Alcohol use No     Comment: 1 drink every few months    Review of Systems  Constitutional: No fever/chills Eyes: No visual changes. ENT: No sore throat. Cardiovascular: Denies chest pain. Respiratory: Denies shortness of breath. Gastrointestinal:  abdominal pain.   nausea, no vomiting.  No diarrhea.  No constipation. Genitourinary: Negative for dysuria. Musculoskeletal: Negative for back pain. Skin: Negative for rash. Neurological: Negative for headaches, focal weakness or numbness.   ____________________________________________   PHYSICAL EXAM:  VITAL SIGNS: ED Triage Vitals [01/18/17 2135]  Enc Vitals Group     BP 137/78  Pulse Rate 80     Resp 18     Temp 98.6 F (37 C)     Temp Source Oral     SpO2 100 %     Weight 200 lb (90.7 kg)     Height 5\' 9"  (1.753 m)     Head Circumference      Peak Flow      Pain Score 8     Pain Loc      Pain Edu?      Excl. in [place]?     Constitutional: Alert and oriented. Well appearing and in Moderate distress. Eyes: Conjunctivae are normal. PERRL. EOMI. Head: Atraumatic. Nose: No congestion/rhinnorhea. Mouth/Throat: Mucous membranes are moist.  Oropharynx non-erythematous. Cardiovascular: Normal rate, regular rhythm. Grossly normal heart sounds.  Good peripheral circulation. Respiratory: Normal respiratory effort.  No retractions. Lungs CTAB. Gastrointestinal: Soft with some right upper quadrant, right lateral tenderness to palpation. Positive bowel sounds with some right flank pain to palpation Musculoskeletal: No lower extremity tenderness nor edema.   Neurologic:  Normal speech and language.  Skin:  Skin is warm, dry and intact.  Psychiatric: Mood and affect are normal.   ____________________________________________   LABS (all labs ordered are listed, but only abnormal results are  displayed)  Labs Reviewed  CBC - Abnormal; Notable for the following:       Result Value   RDW 14.6 (*)    All other components within normal limits  URINALYSIS, COMPLETE (UACMP) WITH MICROSCOPIC - Abnormal; Notable for the following:    Color, Urine YELLOW (*)    APPearance CLEAR (*)    Squamous Epithelial / LPF 0-5 (*)    All other components within normal limits  LIPASE, BLOOD  COMPREHENSIVE METABOLIC PANEL   ____________________________________________  EKG  none ____________________________________________  RADIOLOGY  Ct Renal Stone Study  Result Date: 01/19/2017 CLINICAL DATA:  Dull right upper quadrant pain for 3 days EXAM: CT ABDOMEN AND PELVIS WITHOUT CONTRAST TECHNIQUE: Multidetector CT imaging of the abdomen and pelvis was performed following the standard protocol without IV contrast. COMPARISON:  05/14/2016 FINDINGS: Lower chest: Lung bases demonstrate no acute consolidation or pleural effusion. Normal heart size. Hepatobiliary: Status post cholecystectomy. No focal hepatic abnormality. Enlarged extrahepatic bile duct up to 13 mm, similar compared to prior Pancreas: Unremarkable. No pancreatic ductal dilatation or surrounding inflammatory changes. Spleen: Normal in size without focal abnormality. Adrenals/Urinary Tract: Adrenal glands are unremarkable. Kidneys are normal, without renal calculi, focal lesion, or hydronephrosis. Bladder is unremarkable. Stomach/Bowel: Status post gastric bypass. Negative for bowel obstruction. No colon wall thickening. Normal appendix. Vascular/Lymphatic: Non aneurysmal aorta. Multiple small subcentimeter mesenteric lymph nodes. Reproductive: Status post hysterectomy. No adnexal masses. Other: Small free fluid in the pelvis.  Negative for free air. Musculoskeletal: No acute or significant osseous findings. IMPRESSION: 1. No CT evidence for acute intra-abdominal or pelvic pathology. Negative for kidney stone, hydronephrosis or ureteral stone 2.  Negative appendix 3. Post cholecystectomy.  Stable extrahepatic biliary dilatation. 4. Multiple small nonspecific mesenteric lymph nodes 5. Small amount of free fluid in the pelvis Electronically Signed   By: Donavan Foil M.D.   On: 01/19/2017 01:16   US Abdomen Limited Ruq  Result Date: 01/19/2017 CLINICAL DATA:  Acute onset of right upper quadrant abdominal pain. Initial encounter. EXAM: ULTRASOUND ABDOMEN LIMITED RIGHT UPPER QUADRANT COMPARISON:  CT of the abdomen and pelvis performed earlier today at 1:02 a.m. FINDINGS: Gallbladder: Status post cholecystectomy.  No retained stones seen. Common bile duct: Diameter:  1.4 cm, within normal limits status post cholecystectomy. Liver: No focal lesion identified. Within normal limits in parenchymal echogenicity. Portal vein is patent on color Doppler imaging with normal direction of blood flow towards the liver. IMPRESSION: Unremarkable ultrasound of the right upper quadrant. Status post cholecystectomy. Electronically Signed   By: Garald Balding M.D.   On: 01/19/2017 04:58    ____________________________________________   PROCEDURES  Procedure(s) performed: None  Procedures  Critical Care performed: No  ____________________________________________   INITIAL IMPRESSION / ASSESSMENT AND PLAN / ED COURSE  Pertinent labs & imaging results that were available during my care of the patient were reviewed by me and considered in my medical decision making (see chart for details).  This is a 41 year old female who comes into the hospital today with some abdominal pain. Although the patient has had a cholecystectomy I will send the patient for an ultrasound. She does have some flank pains I will also send her for a non-con CT looking for possible kidney stone. I will give the patient dose of morphine and Zofran and liter of normal saline. She will be reassessed.     The patient's CT scan was concerning for mesenteric adenitis. I did perform an  ultrasound and ultrasound was unremarkable. The patient did receive 2 doses of morphine, Dilaudid, Carafate and GI cocktail. The patient received some Toradol and her pain is improved. At this time the patient's blood work is unremarkable as are her imaging studies. I expect to the patient that she should follow up with her primary care physician and likely her bariatric surgeon for further evaluation of this abdominal pain. The patient should return if she has any worsened pain or any other concerns. She'll be discharged home.  ____________________________________________   FINAL CLINICAL IMPRESSION(S) / ED DIAGNOSES  Final diagnoses:  Abdominal pain  Pain of upper abdomen  Mesenteric adenitis      NEW MEDICATIONS STARTED DURING THIS VISIT:  New Prescriptions   ETODOLAC (LODINE) 200 MG CAPSULE    Take 1 capsule (200 mg total) by mouth every 8 (eight) hours.   ONDANSETRON (ZOFRAN ODT) 4 MG DISINTEGRATING TABLET    Take 1 tablet (4 mg total) by mouth every 8 (eight) hours as needed for nausea or vomiting.     Note:  This document was prepared using Dragon voice recognition software and may include unintentional dictation errors.    Loney Hering, MD 01/19/17 [phone]

## 2017-01-19 NOTE — Discharge Instructions (Signed)
Please follow up with your primary care physician for further evaluation of your pain °

## 2017-01-20 ENCOUNTER — Other Ambulatory Visit: Payer: Self-pay

## 2017-01-20 ENCOUNTER — Other Ambulatory Visit: Payer: Self-pay | Admitting: Family Medicine

## 2017-01-20 DIAGNOSIS — M5116 Intervertebral disc disorders with radiculopathy, lumbar region: Secondary | ICD-10-CM

## 2017-01-20 DIAGNOSIS — M5136 Other intervertebral disc degeneration, lumbar region: Secondary | ICD-10-CM

## 2017-01-20 MED ORDER — OXYCODONE HCL 10 MG PO TABS: ORAL_TABLET | ORAL | 0 refills | Status: DC

## 2017-01-20 NOTE — Telephone Encounter (Signed)
Oxycodone rx up front for pickup. Can be refilled on 8/24. Mesenteric adenitis is usually self limiting and requires no treatment. The lymph nodes are doing their job and responding to a low grade infection.-usually viral. If you have further concerns about this you may wish to see your [facility] Doctors.

## 2017-01-20 NOTE — Telephone Encounter (Signed)
Patient called requesting a refill on Oxycodone HCl 10 MG TABS.  She also reports that she was seen in the ER over the weekend, and they told her that she must be seen by her PCP or GI to manage Mesenteric adenitis. She currently does not have insurance. Patient wanted to know is this something that you follow? Please advise. Thanks!

## 2017-01-21 NOTE — Telephone Encounter (Signed)
Patient advised.KW 

## 2017-01-21 NOTE — Telephone Encounter (Signed)
[id number] 

## 2017-02-19 ENCOUNTER — Other Ambulatory Visit: Payer: Self-pay | Admitting: Family Medicine

## 2017-02-19 ENCOUNTER — Telehealth: Payer: Self-pay | Admitting: Family Medicine

## 2017-02-19 DIAGNOSIS — M5136 Other intervertebral disc degeneration, lumbar region: Secondary | ICD-10-CM

## 2017-02-19 DIAGNOSIS — M5116 Intervertebral disc disorders with radiculopathy, lumbar region: Secondary | ICD-10-CM

## 2017-02-19 DIAGNOSIS — F419 Anxiety disorder, unspecified: Secondary | ICD-10-CM

## 2017-02-19 MED ORDER — CLONAZEPAM 0.5 MG PO TABS: 0.5000 mg | ORAL_TABLET | Freq: Two times a day (BID) | ORAL | 0 refills | Status: DC | PRN

## 2017-02-19 MED ORDER — OXYCODONE HCL 10 MG PO TABS: ORAL_TABLET | ORAL | 0 refills | Status: DC

## 2017-02-19 NOTE — Telephone Encounter (Signed)
[id number] 

## 2017-02-19 NOTE — Telephone Encounter (Signed)
Prescriptions are up front for pickup. Oxycodone can be filled 02/23/17

## 2017-02-19 NOTE — Telephone Encounter (Signed)
Pt needs a refill on her oxycodone 10mg   Also she stated when she picked up her last rx of klonopin 0.5 it was only 30 pills but she either needs 60 pills or the 1mg  30 pills to last her the months worth.  Thanks [employer]

## 2017-03-17 ENCOUNTER — Encounter: Payer: Self-pay | Admitting: Family Medicine

## 2017-03-17 ENCOUNTER — Other Ambulatory Visit: Payer: Self-pay | Admitting: Family Medicine

## 2017-03-17 DIAGNOSIS — M5116 Intervertebral disc disorders with radiculopathy, lumbar region: Secondary | ICD-10-CM

## 2017-03-17 DIAGNOSIS — M51369 Other intervertebral disc degeneration, lumbar region without mention of lumbar back pain or lower extremity pain: Secondary | ICD-10-CM

## 2017-03-17 DIAGNOSIS — M5136 Other intervertebral disc degeneration, lumbar region: Secondary | ICD-10-CM

## 2017-03-19 ENCOUNTER — Other Ambulatory Visit: Payer: Self-pay | Admitting: Family Medicine

## 2017-03-25 ENCOUNTER — Other Ambulatory Visit: Payer: Self-pay | Admitting: Family Medicine

## 2017-03-25 ENCOUNTER — Encounter: Payer: Self-pay | Admitting: Family Medicine

## 2017-03-25 DIAGNOSIS — M5116 Intervertebral disc disorders with radiculopathy, lumbar region: Secondary | ICD-10-CM

## 2017-03-25 DIAGNOSIS — M5136 Other intervertebral disc degeneration, lumbar region: Secondary | ICD-10-CM

## 2017-03-25 DIAGNOSIS — F419 Anxiety disorder, unspecified: Secondary | ICD-10-CM

## 2017-03-25 MED ORDER — OXYCODONE HCL 10 MG PO TABS: ORAL_TABLET | ORAL | 0 refills | Status: DC

## 2017-04-22 ENCOUNTER — Other Ambulatory Visit: Payer: Self-pay | Admitting: Family Medicine

## 2017-04-22 DIAGNOSIS — F419 Anxiety disorder, unspecified: Secondary | ICD-10-CM

## 2017-04-23 ENCOUNTER — Other Ambulatory Visit: Payer: Self-pay | Admitting: Family Medicine

## 2017-04-23 DIAGNOSIS — F419 Anxiety disorder, unspecified: Secondary | ICD-10-CM

## 2017-04-23 MED ORDER — CLONAZEPAM 0.5 MG PO TABS: 0.5000 mg | ORAL_TABLET | Freq: Two times a day (BID) | ORAL | 0 refills | Status: DC | PRN

## 2017-04-23 NOTE — Progress Notes (Signed)
Prescription has been called into pharmacy. KW 

## 2017-04-30 ENCOUNTER — Other Ambulatory Visit: Payer: Self-pay | Admitting: Family Medicine

## 2017-04-30 DIAGNOSIS — F410 Panic disorder [episodic paroxysmal anxiety] without agoraphobia: Secondary | ICD-10-CM

## 2017-05-01 ENCOUNTER — Other Ambulatory Visit: Payer: Self-pay | Admitting: Family Medicine

## 2017-05-13 ENCOUNTER — Other Ambulatory Visit: Payer: Self-pay | Admitting: Family Medicine

## 2017-05-13 DIAGNOSIS — M5136 Other intervertebral disc degeneration, lumbar region: Secondary | ICD-10-CM

## 2017-05-13 DIAGNOSIS — M5116 Intervertebral disc disorders with radiculopathy, lumbar region: Secondary | ICD-10-CM

## 2017-05-14 ENCOUNTER — Other Ambulatory Visit: Payer: Self-pay | Admitting: Family Medicine

## 2017-05-14 ENCOUNTER — Telehealth: Payer: Self-pay

## 2017-05-14 MED ORDER — OXYCODONE HCL 10 MG PO TABS: ORAL_TABLET | ORAL | 0 refills | Status: DC

## 2017-05-14 NOTE — Telephone Encounter (Signed)
-----   Message from Carmon Ginsberg, [place] sent at 05/14/2017  1:52 PM EST ----- Let her know her prescription is available for pickup

## 2017-05-14 NOTE — Telephone Encounter (Signed)
Patient advised.KW 

## 2017-05-21 ENCOUNTER — Other Ambulatory Visit: Payer: Self-pay | Admitting: Family Medicine

## 2017-06-02 ENCOUNTER — Encounter: Payer: Self-pay | Admitting: *Deleted

## 2017-06-02 ENCOUNTER — Ambulatory Visit: Payer: Self-pay | Attending: Oncology | Admitting: *Deleted

## 2017-06-02 VITALS — BP 133/84 | HR 83 | Temp 98.2°F | Ht 72.0 in | Wt 213.0 lb

## 2017-06-02 DIAGNOSIS — N6452 Nipple discharge: Secondary | ICD-10-CM

## 2017-06-02 NOTE — Progress Notes (Signed)
Subjective:     Patient ID: Misty Zamora, female   DOB: Oct 30, 1975, 41 y.o.   MRN: [id number]  HPI   Review of Systems     Objective:   Physical Exam  Pulmonary/Chest: Right breast exhibits nipple discharge. Right breast exhibits no inverted nipple, no mass, no skin change and no tenderness. Left breast exhibits nipple discharge. Left breast exhibits no inverted nipple, no mass, no skin change and no tenderness. Breasts are symmetrical.         Assessment:     41 year old White female presents to [facility] with complaints of bilateral nipple discharge.  States for the last 2 weeks she has felt like her breast were "engorged".  States she has had green / bloody nipple discharge on expression for a couple of months.  Patient with family history of breast cancer in her mom at age 58, paternal aunt, grandmother and great aunt all diagnosed in their 38's.  On clinical breast exam there is an approximate 3 cm asymmetrical thickening at 11:00 left breast.  There is no dominant mass, skin changes or spontaneous nipple discharge.  There is however, a light green discharge from a central duct at bilateral nipples on expression.  The patient denies smoking.  States she quit in 2001.  Taught self breast awareness. Last mammogram on 06/23/15 was a birads 1, showing heterogeneously dense breast tissue. Patient has been screened for eligibility.  She does not have any insurance, Medicare or Medicaid.  She also meets financial eligibility.  Hand-out given on the Affordable Care Act.    Plan:     Will get bilateral diagnostic mammogram and ultrasound.  If no findings on imaging, will refer for surgical consult.  Will follow-up per [facility] protocol.

## 2017-06-02 NOTE — Patient Instructions (Signed)
Gave patient hand-out, Women Staying Healthy, Active and Well from [facility], with education on breast health, pap smears, heart and colon health. 

## 2017-06-10 ENCOUNTER — Other Ambulatory Visit: Payer: Self-pay

## 2017-06-15 ENCOUNTER — Other Ambulatory Visit: Payer: Self-pay | Admitting: Family Medicine

## 2017-06-15 DIAGNOSIS — F419 Anxiety disorder, unspecified: Secondary | ICD-10-CM

## 2017-06-16 ENCOUNTER — Other Ambulatory Visit: Payer: Self-pay | Admitting: Family Medicine

## 2017-06-16 NOTE — Telephone Encounter (Signed)
Please phone in clonazepam as requested and update in EMR

## 2017-06-16 NOTE — Telephone Encounter (Signed)
Prescription has been called into pharmacy. KW 

## 2017-06-17 MED ORDER — CLONAZEPAM 0.5 MG PO TABS: 0.5000 mg | ORAL_TABLET | Freq: Two times a day (BID) | ORAL | 0 refills | Status: DC | PRN

## 2017-06-19 ENCOUNTER — Ambulatory Visit
Admission: RE | Admit: 2017-06-19 | Discharge: 2017-06-19 | Disposition: A | Discharge status: Home / Self Care | Payer: Self-pay | Source: Ambulatory Visit | Attending: Oncology | Admitting: Oncology

## 2017-06-19 DIAGNOSIS — N6452 Nipple discharge: Secondary | ICD-10-CM

## 2017-06-24 ENCOUNTER — Other Ambulatory Visit: Payer: Self-pay | Admitting: Family Medicine

## 2017-06-24 DIAGNOSIS — M5136 Other intervertebral disc degeneration, lumbar region: Secondary | ICD-10-CM

## 2017-06-24 DIAGNOSIS — M5116 Intervertebral disc disorders with radiculopathy, lumbar region: Secondary | ICD-10-CM

## 2017-06-25 ENCOUNTER — Telehealth: Payer: Self-pay | Admitting: *Deleted

## 2017-06-25 ENCOUNTER — Other Ambulatory Visit: Payer: Self-pay | Admitting: Family Medicine

## 2017-06-25 ENCOUNTER — Telehealth: Payer: Self-pay

## 2017-06-25 MED ORDER — OXYCODONE HCL 10 MG PO TABS: ORAL_TABLET | ORAL | 0 refills | Status: DC

## 2017-06-25 NOTE — Telephone Encounter (Signed)
Reviewed patient's mammogram results with benign findings.  I would like to offer the patient a surgical consult for further evaluation of her nipple discharge.  Left her a message to return my call.

## 2017-06-25 NOTE — Telephone Encounter (Signed)
Left message for patient letting her know that prescription is available for pick up. KW

## 2017-06-25 NOTE — Telephone Encounter (Signed)
-----   Message from Carmon Ginsberg, [place] sent at 06/25/2017  8:49 AM EST ----- Narcotic prescription available for pickup. May fill 1/25.

## 2017-07-03 ENCOUNTER — Telehealth: Payer: Self-pay | Admitting: *Deleted

## 2017-07-03 NOTE — Telephone Encounter (Signed)
Called patient to review her mammogram results and need for referral for continuation of her bilateral nipple discharge.  Appointment scheduled to see Dr. Bary Castilla on 07/09/17 @ 2:45.  Patient states she has Medicaid now.  Dr. Dwyane Luo office notified to bill Medicaid.

## 2017-07-09 ENCOUNTER — Ambulatory Visit: Payer: Self-pay | Admitting: Family Medicine

## 2017-07-09 ENCOUNTER — Ambulatory Visit: Payer: Self-pay | Admitting: General Surgery

## 2017-07-10 ENCOUNTER — Encounter: Payer: Self-pay | Admitting: Family Medicine

## 2017-07-10 ENCOUNTER — Ambulatory Visit: Payer: Medicaid Other | Admitting: Family Medicine

## 2017-07-10 VITALS — BP 112/84 | HR 62 | Temp 99.2°F | Resp 15 | Wt 207.4 lb

## 2017-07-10 DIAGNOSIS — R1011 Right upper quadrant pain: Secondary | ICD-10-CM | POA: Diagnosis not present

## 2017-07-10 DIAGNOSIS — F410 Panic disorder [episodic paroxysmal anxiety] without agoraphobia: Secondary | ICD-10-CM

## 2017-07-10 DIAGNOSIS — R1013 Epigastric pain: Secondary | ICD-10-CM

## 2017-07-10 DIAGNOSIS — F419 Anxiety disorder, unspecified: Secondary | ICD-10-CM | POA: Diagnosis not present

## 2017-07-10 MED ORDER — PAROXETINE HCL 40 MG PO TABS: 40.0000 mg | ORAL_TABLET | Freq: Every day | ORAL | 5 refills | Status: DC

## 2017-07-10 NOTE — Patient Instructions (Signed)
Continue Carafate before meals and Protonix. We will call you about the lab test. Do follow up with [facility] As scheduled.

## 2017-07-10 NOTE — Progress Notes (Signed)
Subjective:     Patient ID: Misty Zamora, female   DOB: 1975/06/09, 42 y.o.   MRN: [id number] Chief Complaint  Patient presents with  . Abdominal Pain    Patient comes in office today with complaints of RUQ pain for the past week, patient reports that she has been seen in ED twice for pain. Patient reports that at her recent CT there was a lesion noted on her kidney. Patient states that she has been getting full after eating small portions and has dropped up to 20lbs in the past month. Patient reports taking prescription pain medicine, using heating pad, protonix and sulcrafate.    HPI States she has gets fullness after eating   then will have nausea and vomiting. Reports the pain was initially epigastric but now is more in the RUQ. She is concerned about her low grade fever this AM. Has had workups at both [facility] and [facility] without significant findings. She is pending f/u with [facility] 07/14/17. Remains on Carafate twice daily and Protonix. She ran out of paroxetine and wishes it refilled as it was helping control her anxiety. Was noted to have a tiny "too small to characterize" area of hypo-attenuation on the upper pole of her right kidney.  Review of Systems  Gastrointestinal: Negative for constipation and diarrhea.       Objective:   Physical Exam  Constitutional: She appears well-developed and well-nourished. No distress.  Abdominal: Soft. Bowel sounds are normal. There is no tenderness. There is no guarding.  Psychiatric:  Mildly anxious       Assessment:    1. Right upper quadrant pain - CBC with Differential/Platelet  2. Epigastric abdominal pain - H. pylori breath test  3. Anxiety/anxiety attacks - PARoxetine (PAXIL) 40 MG tablet; Take 1 tablet (40 mg total) by mouth daily.  Dispense: 30 tablet; Refill: 5    Plan:    Continue current medication, follow up with [facility] we will call with the lab results.

## 2017-07-11 ENCOUNTER — Telehealth: Payer: Self-pay

## 2017-07-11 LAB — CBC WITH DIFFERENTIAL/PLATELET
BASOS: 1 %
Basophils Absolute: 0.1 10*3/uL (ref 0.0–0.2)
EOS (ABSOLUTE): 0.2 10*3/uL (ref 0.0–0.4)
Eos: 3 %
Hematocrit: 38.5 % (ref 34.0–46.6)
Hemoglobin: 12.8 g/dL (ref 11.1–15.9)
Immature Grans (Abs): 0 10*3/uL (ref 0.0–0.1)
Immature Granulocytes: 0 %
Lymphocytes Absolute: 1.6 10*3/uL (ref 0.7–3.1)
Lymphs: 29 %
MCH: 27.9 pg (ref 26.6–33.0)
MCHC: 33.2 g/dL (ref 31.5–35.7)
MCV: 84 fL (ref 79–97)
MONOS ABS: 0.3 10*3/uL (ref 0.1–0.9)
Monocytes: 6 %
NEUTROS ABS: 3.3 10*3/uL (ref 1.4–7.0)
NEUTROS PCT: 61 %
PLATELETS: 196 10*3/uL (ref 150–379)
RBC: 4.59 x10E6/uL (ref 3.77–5.28)
RDW: 13.5 % (ref 12.3–15.4)
WBC: 5.4 10*3/uL (ref 3.4–10.8)

## 2017-07-11 NOTE — Telephone Encounter (Signed)
Patient advised.KW 

## 2017-07-11 NOTE — Telephone Encounter (Signed)
-----   Message from Carmon Ginsberg, [place] sent at 07/11/2017  7:19 AM EST ----- Blood count looks good: no anemia or elevated white count

## 2017-07-12 LAB — H. PYLORI BREATH TEST: H PYLORI BREATH TEST: NEGATIVE

## 2017-07-14 ENCOUNTER — Telehealth: Payer: Self-pay

## 2017-07-14 NOTE — Telephone Encounter (Signed)
LMTCB 07/14/2017  Thanks,   -Mickel Baas

## 2017-07-14 NOTE — Telephone Encounter (Signed)
-----   Message from Carmon Ginsberg, [place] sent at 07/14/2017  7:28 AM EST ----- Breath test ok-Do followup with [facility]

## 2017-07-15 NOTE — Telephone Encounter (Signed)
[id number] 

## 2017-07-16 ENCOUNTER — Other Ambulatory Visit: Payer: Self-pay | Admitting: Family Medicine

## 2017-07-16 DIAGNOSIS — F419 Anxiety disorder, unspecified: Secondary | ICD-10-CM

## 2017-07-16 NOTE — Telephone Encounter (Signed)
Unable to reach patient will mail letter home. KW 

## 2017-07-16 NOTE — Telephone Encounter (Signed)
Last filled 06/17/17, please review. KW

## 2017-07-17 ENCOUNTER — Telehealth: Payer: Self-pay

## 2017-07-17 NOTE — Telephone Encounter (Signed)
Patient called office requesting to speak with Mikki Santee in regards to her weight. Patient states today she weighed in at 194lbs and states she has dropped 30lbs in two months. Patient states that she is unable to hold down solid foods and has been drinking liquids since it is all she can tolerate. Patient complains of nausea and vomiting and has concerns that this is related to her gastric surgery she had a few years ago. Patient states that she had discussed concern at last office visit, patient states that she has a endoscopy scheduled today at 3:15PM. Patient is requesting that Mikki Santee call back before appt. KW

## 2017-07-17 NOTE — Telephone Encounter (Signed)
Prescription has been called into pharmacy. KW 

## 2017-07-17 NOTE — Telephone Encounter (Signed)
Wished reassurance that they were not looking for cancer as a cause for her sx. Told her that the consult note did not reflect any concern about cancer as a cause for her sx.

## 2017-07-23 ENCOUNTER — Encounter: Payer: Self-pay | Admitting: *Deleted

## 2017-07-28 ENCOUNTER — Other Ambulatory Visit: Payer: Self-pay | Admitting: Family Medicine

## 2017-07-28 DIAGNOSIS — M5116 Intervertebral disc disorders with radiculopathy, lumbar region: Secondary | ICD-10-CM

## 2017-07-28 DIAGNOSIS — M5136 Other intervertebral disc degeneration, lumbar region: Secondary | ICD-10-CM

## 2017-07-29 MED ORDER — OXYCODONE HCL 10 MG PO TABS: ORAL_TABLET | ORAL | 0 refills | Status: DC

## 2017-08-01 HISTORY — PX: COLON SURGERY: SHX602

## 2017-08-08 ENCOUNTER — Other Ambulatory Visit: Payer: Self-pay | Admitting: Family Medicine

## 2017-08-11 ENCOUNTER — Telehealth: Payer: Self-pay | Admitting: Family Medicine

## 2017-08-11 DIAGNOSIS — K259 Gastric ulcer, unspecified as acute or chronic, without hemorrhage or perforation: Secondary | ICD-10-CM | POA: Insufficient documentation

## 2017-08-11 NOTE — Telephone Encounter (Signed)
[id number] 

## 2017-08-11 NOTE — Telephone Encounter (Signed)
Pt is requesting Nunzio Cory return her call because she wants to update Nunzio Cory on pt's recent surgery and the fact that pt was in the hospital for 8 days. Please advise. Thanks TNP

## 2017-08-11 NOTE — Telephone Encounter (Signed)
Pt returned call. Thanks TNP °

## 2017-08-11 NOTE — Telephone Encounter (Signed)
Spoke with patient on the phone she states that she had an endoscopy done at [facility] and was told that it was normal, patient reports that she was still having nausea and vomiting and followed up with her surgeon who she says did a Fluoroscopy and reviewed her endoscopy and told her it was abnormal. Patient states that on 08/01/17 she had a colon resection and revision of bypass, she states that she was hospitalized at [place] for 8 days after surgery. Patient reports that she has a follow up appt with bariatric surgeon by end of this week, she reports that symptoms of nausea and vomiting have cleared since surgery.

## 2017-08-26 ENCOUNTER — Other Ambulatory Visit: Payer: Self-pay

## 2017-08-26 ENCOUNTER — Encounter: Payer: Self-pay | Admitting: *Deleted

## 2017-08-26 ENCOUNTER — Other Ambulatory Visit: Payer: Self-pay | Admitting: Family Medicine

## 2017-08-26 ENCOUNTER — Telehealth: Payer: Self-pay | Admitting: *Deleted

## 2017-08-26 ENCOUNTER — Encounter: Payer: Self-pay | Admitting: Family Medicine

## 2017-08-26 DIAGNOSIS — M5136 Other intervertebral disc degeneration, lumbar region: Secondary | ICD-10-CM

## 2017-08-26 DIAGNOSIS — R1310 Dysphagia, unspecified: Secondary | ICD-10-CM

## 2017-08-26 DIAGNOSIS — M5116 Intervertebral disc disorders with radiculopathy, lumbar region: Secondary | ICD-10-CM

## 2017-08-26 DIAGNOSIS — R1319 Other dysphagia: Secondary | ICD-10-CM

## 2017-08-26 NOTE — Telephone Encounter (Signed)
Patient did not show up for her surgical consult with Dr. Bary Castilla in February for evaluation of her nipple discharge.  Per notes in Epic patient has had surgery and multiple appointments.  Left message for patient to return my call.  I would like to reschedule patient for surgical consult or at least reassess her nipple discharge.

## 2017-08-28 NOTE — Discharge Instructions (Signed)
General Anesthesia, Adult, Care After °These instructions provide you with information about caring for yourself after your procedure. Your health care provider may also give you more specific instructions. Your treatment has been planned according to current medical practices, but problems sometimes occur. Call your health care provider if you have any problems or questions after your procedure. °What can I expect after the procedure? °After the procedure, it is common to have: °· Vomiting. °· A sore throat. °· Mental slowness. ° °It is common to feel: °· Nauseous. °· Cold or shivery. °· Sleepy. °· Tired. °· Sore or achy, even in parts of your body where you did not have surgery. ° °Follow these instructions at home: °For at least 24 hours after the procedure: °· Do not: °? Participate in activities where you could fall or become injured. °? Drive. °? Use heavy machinery. °? Drink alcohol. °? Take sleeping pills or medicines that cause drowsiness. °? Make important decisions or sign legal documents. °? Take care of children on your own. °· Rest. °Eating and drinking °· If you vomit, drink water, juice, or soup when you can drink without vomiting. °· Drink enough fluid to keep your urine clear or pale yellow. °· Make sure you have little or no nausea before eating solid foods. °· Follow the diet recommended by your health care provider. °General instructions °· Have a responsible adult stay with you until you are awake and alert. °· Return to your normal activities as told by your health care provider. Ask your health care provider what activities are safe for you. °· Take over-the-counter and prescription medicines only as told by your health care provider. °· If you smoke, do not smoke without supervision. °· Keep all follow-up visits as told by your health care provider. This is important. °Contact a health care provider if: °· You continue to have nausea or vomiting at home, and medicines are not helpful. °· You  cannot drink fluids or start eating again. °· You cannot urinate after 8-12 hours. °· You develop a skin rash. °· You have fever. °· You have increasing redness at the site of your procedure. °Get help right away if: °· You have difficulty breathing. °· You have chest pain. °· You have unexpected bleeding. °· You feel that you are having a life-threatening or urgent problem. °This information is not intended to replace advice given to you by your health care provider. Make sure you discuss any questions you have with your health care provider. °Document Released: 08/26/2000 Document Revised: 10/23/2015 Document Reviewed: 05/04/2015 °Elsevier Interactive Patient Education © 2018 Elsevier Inc. ° °

## 2017-08-29 ENCOUNTER — Ambulatory Visit: Payer: Medicaid Other | Admitting: Anesthesiology

## 2017-08-29 ENCOUNTER — Encounter
Admission: RE | Disposition: A | Discharge status: Home / Self Care | Payer: Self-pay | Source: Ambulatory Visit | Attending: Gastroenterology

## 2017-08-29 ENCOUNTER — Ambulatory Visit
Admission: RE | Admit: 2017-08-29 | Discharge: 2017-08-29 | Disposition: A | Discharge status: Home / Self Care | Payer: Medicaid Other | Source: Ambulatory Visit | Attending: Gastroenterology | Admitting: Gastroenterology

## 2017-08-29 DIAGNOSIS — Z8249 Family history of ischemic heart disease and other diseases of the circulatory system: Secondary | ICD-10-CM | POA: Insufficient documentation

## 2017-08-29 DIAGNOSIS — F329 Major depressive disorder, single episode, unspecified: Secondary | ICD-10-CM | POA: Diagnosis not present

## 2017-08-29 DIAGNOSIS — F419 Anxiety disorder, unspecified: Secondary | ICD-10-CM | POA: Diagnosis not present

## 2017-08-29 DIAGNOSIS — Z833 Family history of diabetes mellitus: Secondary | ICD-10-CM | POA: Diagnosis not present

## 2017-08-29 DIAGNOSIS — R011 Cardiac murmur, unspecified: Secondary | ICD-10-CM | POA: Insufficient documentation

## 2017-08-29 DIAGNOSIS — Z801 Family history of malignant neoplasm of trachea, bronchus and lung: Secondary | ICD-10-CM | POA: Diagnosis not present

## 2017-08-29 DIAGNOSIS — R1319 Other dysphagia: Secondary | ICD-10-CM

## 2017-08-29 DIAGNOSIS — G4730 Sleep apnea, unspecified: Secondary | ICD-10-CM | POA: Insufficient documentation

## 2017-08-29 DIAGNOSIS — Z888 Allergy status to other drugs, medicaments and biological substances status: Secondary | ICD-10-CM | POA: Insufficient documentation

## 2017-08-29 DIAGNOSIS — Z9889 Other specified postprocedural states: Secondary | ICD-10-CM | POA: Diagnosis not present

## 2017-08-29 DIAGNOSIS — M5116 Intervertebral disc disorders with radiculopathy, lumbar region: Secondary | ICD-10-CM | POA: Insufficient documentation

## 2017-08-29 DIAGNOSIS — Z9884 Bariatric surgery status: Secondary | ICD-10-CM | POA: Diagnosis not present

## 2017-08-29 DIAGNOSIS — Z803 Family history of malignant neoplasm of breast: Secondary | ICD-10-CM | POA: Diagnosis not present

## 2017-08-29 DIAGNOSIS — Z90710 Acquired absence of both cervix and uterus: Secondary | ICD-10-CM | POA: Insufficient documentation

## 2017-08-29 DIAGNOSIS — Z87442 Personal history of urinary calculi: Secondary | ICD-10-CM | POA: Insufficient documentation

## 2017-08-29 DIAGNOSIS — Z7951 Long term (current) use of inhaled steroids: Secondary | ICD-10-CM | POA: Diagnosis not present

## 2017-08-29 DIAGNOSIS — K3189 Other diseases of stomach and duodenum: Secondary | ICD-10-CM | POA: Diagnosis not present

## 2017-08-29 DIAGNOSIS — Z79899 Other long term (current) drug therapy: Secondary | ICD-10-CM | POA: Diagnosis not present

## 2017-08-29 DIAGNOSIS — K289 Gastrojejunal ulcer, unspecified as acute or chronic, without hemorrhage or perforation: Secondary | ICD-10-CM | POA: Diagnosis not present

## 2017-08-29 DIAGNOSIS — Z87891 Personal history of nicotine dependence: Secondary | ICD-10-CM | POA: Insufficient documentation

## 2017-08-29 DIAGNOSIS — M5000 Cervical disc disorder with myelopathy, unspecified cervical region: Secondary | ICD-10-CM | POA: Insufficient documentation

## 2017-08-29 DIAGNOSIS — Z8371 Family history of colonic polyps: Secondary | ICD-10-CM | POA: Diagnosis not present

## 2017-08-29 DIAGNOSIS — K219 Gastro-esophageal reflux disease without esophagitis: Secondary | ICD-10-CM | POA: Diagnosis not present

## 2017-08-29 DIAGNOSIS — I10 Essential (primary) hypertension: Secondary | ICD-10-CM | POA: Insufficient documentation

## 2017-08-29 DIAGNOSIS — R1310 Dysphagia, unspecified: Secondary | ICD-10-CM | POA: Diagnosis not present

## 2017-08-29 DIAGNOSIS — K929 Disease of digestive system, unspecified: Secondary | ICD-10-CM

## 2017-08-29 DIAGNOSIS — K9189 Other postprocedural complications and disorders of digestive system: Secondary | ICD-10-CM

## 2017-08-29 HISTORY — PX: ESOPHAGOGASTRODUODENOSCOPY (EGD) WITH PROPOFOL: SHX5813

## 2017-08-29 HISTORY — PX: BALLOON DILATION: SHX5330

## 2017-08-29 SURGERY — ESOPHAGOGASTRODUODENOSCOPY (EGD) WITH PROPOFOL
Anesthesia: General | Wound class: Clean Contaminated

## 2017-08-29 MED ORDER — LIDOCAINE HCL (CARDIAC) 20 MG/ML IV SOLN
INTRAVENOUS | Status: DC | PRN
  Administered 2017-08-29: 25 mg via INTRAVENOUS

## 2017-08-29 MED ORDER — GLYCOPYRROLATE 0.2 MG/ML IJ SOLN
INTRAMUSCULAR | Status: DC | PRN
  Administered 2017-08-29: 0.1 mg via INTRAVENOUS

## 2017-08-29 MED ORDER — PROPOFOL 10 MG/ML IV BOLUS
INTRAVENOUS | Status: DC | PRN
  Administered 2017-08-29: 60 mg via INTRAVENOUS
  Administered 2017-08-29: 40 mg via INTRAVENOUS
  Administered 2017-08-29: 60 mg via INTRAVENOUS
  Administered 2017-08-29: 90 mg via INTRAVENOUS

## 2017-08-29 MED ORDER — SODIUM CHLORIDE 0.9 % IV SOLN: INTRAVENOUS | Status: DC

## 2017-08-29 MED ORDER — LACTATED RINGERS IV SOLN
1000.0000 mL | INTRAVENOUS | Status: DC
  Administered 2017-08-29: 1000 mL via INTRAVENOUS
  Administered 2017-08-29: 11:00:00 via INTRAVENOUS

## 2017-08-29 SURGICAL SUPPLY — 33 items
BALLN DILATOR 10-12 8 (BALLOONS)
BALLN DILATOR 12-15 8 (BALLOONS) ×3
BALLN DILATOR 15-18 8 (BALLOONS)
BALLN DILATOR CRE 0-12 8 (BALLOONS)
BALLN DILATOR ESOPH 8 10 CRE (MISCELLANEOUS) IMPLANT
BALLOON DILATOR 12-15 8 (BALLOONS) ×2 IMPLANT
BALLOON DILATOR 15-18 8 (BALLOONS) IMPLANT
BALLOON DILATOR CRE 0-12 8 (BALLOONS) IMPLANT
BLOCK BITE 60FR ADLT L/F GRN (MISCELLANEOUS) ×3 IMPLANT
CANISTER SUCT 1200ML W/VALVE (MISCELLANEOUS) ×3 IMPLANT
CLIP HMST 235XBRD CATH ROT (MISCELLANEOUS) IMPLANT
CLIP RESOLUTION 360 11X235 (MISCELLANEOUS)
ELECT REM PT RETURN 9FT ADLT (ELECTROSURGICAL)
ELECTRODE REM PT RTRN 9FT ADLT (ELECTROSURGICAL) IMPLANT
FCP ESCP3.2XJMB 240X2.8X (MISCELLANEOUS)
FORCEPS BIOP RAD 4 LRG CAP 4 (CUTTING FORCEPS) IMPLANT
FORCEPS BIOP RJ4 240 W/NDL (MISCELLANEOUS)
FORCEPS ESCP3.2XJMB 240X2.8X (MISCELLANEOUS) IMPLANT
GOWN CVR UNV OPN BCK APRN NK (MISCELLANEOUS) ×4 IMPLANT
GOWN ISOL THUMB LOOP REG UNIV (MISCELLANEOUS) ×2
INJECTOR VARIJECT VIN23 (MISCELLANEOUS) IMPLANT
KIT DEFENDO VALVE AND CONN (KITS) IMPLANT
KIT ENDO PROCEDURE OLY (KITS) ×3 IMPLANT
MARKER SPOT ENDO TATTOO 5ML (MISCELLANEOUS) IMPLANT
RETRIEVER NET PLAT FOOD (MISCELLANEOUS) IMPLANT
SNARE SHORT THROW 13M SML OVAL (MISCELLANEOUS) IMPLANT
SNARE SHORT THROW 30M LRG OVAL (MISCELLANEOUS) IMPLANT
SPOT EX ENDOSCOPIC TATTOO (MISCELLANEOUS)
SYR INFLATION 60ML (SYRINGE) ×3 IMPLANT
TRAP ETRAP POLY (MISCELLANEOUS) IMPLANT
VARIJECT INJECTOR VIN23 (MISCELLANEOUS)
WATER STERILE IRR 250ML POUR (IV SOLUTION) ×3 IMPLANT
WIRE CRE 18-20MM 8CM F G (MISCELLANEOUS) IMPLANT

## 2017-08-29 NOTE — Anesthesia Postprocedure Evaluation (Signed)
Anesthesia Post Note  Patient: Misty Zamora  Procedure(s) Performed: ESOPHAGOGASTRODUODENOSCOPY (EGD) WITH PROPOFOL (N/A )  Patient location during evaluation: PACU Anesthesia Type: General Level of consciousness: awake Pain management: pain level controlled Vital Signs Assessment: post-procedure vital signs reviewed and stable Respiratory status: spontaneous breathing Cardiovascular status: blood pressure returned to baseline Postop Assessment: no headache Anesthetic complications: no    Lavonna Monarch

## 2017-08-29 NOTE — Transfer of Care (Signed)
Immediate Anesthesia Transfer of Care Note  Patient: Misty Zamora  Procedure(s) Performed: ESOPHAGOGASTRODUODENOSCOPY (EGD) WITH PROPOFOL (N/A )  Patient Location: PACU  Anesthesia Type: General  Level of Consciousness: awake, alert  and patient cooperative  Airway and Oxygen Therapy: Patient Spontanous Breathing and Patient connected to supplemental oxygen  Post-op Assessment: Post-op Vital signs reviewed, Patient's Cardiovascular Status Stable, Respiratory Function Stable, Patent Airway and No signs of Nausea or vomiting  Post-op Vital Signs: Reviewed and stable  Complications: No apparent anesthesia complications

## 2017-08-29 NOTE — Op Note (Signed)
[facility] Gastroenterology Patient Name: Misty Zamora Procedure Date: 08/29/2017 11:25 AM MRN: [id number] Account #: [id number] Date of Birth: 1975/06/15 Admit Type: Outpatient Age: 42 Room: [facility] OR ROOM 01 Gender: Female Note Status: Finalized Procedure:            Upper GI endoscopy Indications:          Dysphagia Providers:            [name] MD, MD Referring MD:         Venia Carbon. Tyner (Referring MD) Medicines:            Propofol per Anesthesia Complications:        No immediate complications. Procedure:            Pre-Anesthesia Assessment:                       - Prior to the procedure, a History and Physical was                        performed, and patient medications and allergies were                        reviewed. The patient's tolerance of previous                        anesthesia was also reviewed. The risks and benefits of                        the procedure and the sedation options and risks were                        discussed with the patient. All questions were                        answered, and informed consent was obtained. Prior                        Anticoagulants: The patient has taken no previous                        anticoagulant or antiplatelet agents. ASA Grade                        Assessment: II - A patient with mild systemic disease.                        After reviewing the risks and benefits, the patient was                        deemed in satisfactory condition to undergo the                        procedure.                       After obtaining informed consent, the endoscope was                        passed under direct vision. Throughout the procedure,  the patient's blood pressure, pulse, and oxygen                        saturations were monitored continuously. The Olympus                        GIF H180J Endoscope (S#: [id number]) was introduced                        through the  mouth, and advanced to the second part of                        duodenum. The upper GI endoscopy was accomplished                        without difficulty. The patient tolerated the procedure                        well. Findings:      The examined esophagus was normal.      Evidence of a gastric bypass was found. A gastric pouch with a small       size was found. The staple line appeared intact. 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 A TTS       dilator was passed through the scope. Dilation with a 12-13.5-15 mm       balloon dilator was performed to 15 mm. The dilation site was examined       following endoscope reinsertion and showed mild improvement in luminal       narrowing.      The examined jejunum was normal.      A benign-appearing, intrinsic severe stenosis was found at the       anastomosis. This was traversed. Impression:           - Normal esophagus.                       - Gastric bypass with a small-sized pouch and intact                        staple line. Gastrojejunal anastomosis characterized by                        ulceration and severe stenosis. Dilated.                       - Normal examined jejunum.                       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                       - No specimens collected. Recommendation:       - Discharge patient to home.                       - Resume previous diet.                       - Continue present medications. Procedure Code(s):    --- Professional ---  93267, Esophagogastroduodenoscopy, flexible, transoral;                        with dilation of gastric/duodenal stricture(s) (eg,                        balloon, bougie) Diagnosis Code(s):    --- Professional ---                       R13.10, Dysphagia, unspecified                       K31.89, Other diseases of stomach and duodenum CPT copyright 2016 American Medical Association.  All rights reserved. The codes documented in this report are preliminary and upon coder review may  be revised to meet current compliance requirements. [name] MD, MD 08/29/2017 11:37:00 AM This report has been signed electronically. Number of Addenda: 0 Note Initiated On: 08/29/2017 11:25 AM      [facility]

## 2017-08-29 NOTE — Anesthesia Preprocedure Evaluation (Addendum)
Anesthesia Evaluation  Patient identified by MRN, date of birth, ID band Patient awake    Reviewed: Allergy & Precautions, NPO status , Patient's Chart, lab work & pertinent test results  History of Anesthesia Complications (+) PONV  Airway Mallampati: II  TM Distance: >3 FB Neck ROM: Full    Dental no notable dental hx.    Pulmonary former smoker,    Pulmonary exam normal breath sounds clear to auscultation       Cardiovascular hypertension, Normal cardiovascular exam+ Valvular Problems/Murmurs  Rhythm:Regular Rate:Normal     Neuro/Psych PSYCHIATRIC DISORDERS Anxiety Depression  Neuromuscular disease    GI/Hepatic Neg liver ROS, PUD, GERD  ,S/p RnY Pancreatitis N/V   Endo/Other  negative endocrine ROS  Renal/GU negative Renal ROS     Musculoskeletal  (+) Arthritis ,   Abdominal Normal abdominal exam  (+)   Peds  Hematology  (+) anemia ,   Anesthesia Other Findings   Reproductive/Obstetrics                            Anesthesia Physical Anesthesia Plan  ASA: III  Anesthesia Plan: General   Post-op Pain Management:    Induction: Intravenous  PONV Risk Score and Plan: TIVA  Airway Management Planned: Natural Airway  Additional Equipment: None  Intra-op Plan:   Post-operative Plan:   Informed Consent: I have reviewed the patients History and Physical, chart, labs and discussed the procedure including the risks, benefits and alternatives for the proposed anesthesia with the patient or authorized representative who has indicated his/her understanding and acceptance.     Plan Discussed with: CRNA, Anesthesiologist and Surgeon  Anesthesia Plan Comments:         Anesthesia Quick Evaluation

## 2017-08-29 NOTE — H&P (Signed)
[name], MD [facility] [address] [place], [place] [address] Phone:[phone] Fax : [phone]  Primary Care Physician:  Carmon Ginsberg, [place] Primary Gastroenterologist:  Dr. Allen Norris  Pre-Procedure History & Physical: HPI:  Misty Zamora is a 42 y.o. female is here for an endoscopy.   Past Medical History:  Diagnosis Date  . Anemia    had iron infusions. last one in Sept. told levels are now normal  . Anxiety   . 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 Bulging lumbar disc    causes bilateral leg pain  . Depression   . Family history of adverse reaction to anesthesia    Father - PONV  . GERD (gastroesophageal reflux disease)    occasional  . Heart murmur 2005  . History of cardiovascular stress test 2005   showed MR and TR  . History of kidney stones   . Hypertension    no meds since gastric bypass  . IDA (iron deficiency anemia)   . PONV (postoperative nausea and vomiting)    hypotension with epidural  . Sleep apnea    was better after gastric bypass, hasd recently started snoring again.  . Wears contact lenses     Past Surgical History:  Procedure Laterality Date  . ABDOMINAL HYSTERECTOMY  12/2014   ovaries in situ  . BILATERAL SALPINGECTOMY Bilateral 12/27/2014   Procedure: BILATERAL SALPINGECTOMY;  Surgeon: Will Bonnet, MD;  Location: [facility] ORS;  Service: Gynecology;  Laterality: Bilateral;  . COLON SURGERY  08/01/2017   Resection and revision of Bypass - [facility], Dr Duke Salvia  . COLONOSCOPY  2010  . CYSTOSCOPY N/A 12/27/2014   Procedure: CYSTOSCOPY;  Surgeon: Will Bonnet, MD;  Location: [facility] ORS;  Service: Gynecology;  Laterality: N/A;  . DILATION AND CURETTAGE OF UTERUS    . ELBOW ARTHROSCOPY Left 03/30/2015   Procedure: ARTHROSCOPY ELBOW WITH DEBRIDEMENT OF THE SYMPTOMATIC PLICA;  Surgeon: Corky Mull, MD;  Location: [place];  Service: Orthopedics;  Laterality: Left;  . ELBOW SURGERY Left   . ERCP    .  ESOPHAGOGASTRODUODENOSCOPY (EGD) WITH PROPOFOL N/A 06/06/2016   Procedure: ESOPHAGOGASTRODUODENOSCOPY (EGD) WITH PROPOFOL;  Surgeon: [name], MD;  Location: [place];  Service: Endoscopy;  Laterality: N/A;  . gallbladder removed    . GASTRIC BYPASS  2012  . LAPAROSCOPIC HYSTERECTOMY N/A 12/27/2014   Procedure: HYSTERECTOMY TOTAL LAPAROSCOPIC;  Surgeon: Will Bonnet, MD;  Location: [facility] ORS;  Service: Gynecology;  Laterality: N/A;  . laproscopy    . RENAL ENDOSCOPY VIA NEPHROSTOMY / PYELOSTOMY  2010  . TUBAL LIGATION      Prior to Admission medications   Medication Sig Start Date End Date Taking? Authorizing Provider  clonazePAM (KLONOPIN) 0.5 MG tablet TAKE 1 TABLET BY MOUTH TWICE A DAY AS NEEDED FOR ANXIETY 07/17/17  Yes Chauvin, Herbie [place], PA  Cyanocobalamin (VITAMIN B-12 PO) Take by mouth daily.   Yes [provider]  Multiple Vitamins-Minerals (BARIATRIC MULTIVITAMINS/IRON PO) Take by mouth daily.   Yes [provider]  pantoprazole (PROTONIX) 40 MG tablet Take 40 mg by mouth daily.   Yes [provider]  PARoxetine (PAXIL) 40 MG tablet Take 1 tablet (40 mg total) by mouth daily. 07/10/17  Yes Carmon Ginsberg, PA  albuterol (PROVENTIL HFA;VENTOLIN HFA) 108 (90 Base) MCG/ACT inhaler Inhale 2 puffs into the lungs every 6 (six) hours as needed for wheezing or shortness of breath. Patient not taking: Reported on 07/10/2017 04/07/16  Nance Pear, MD  etodolac (LODINE) 200 MG capsule Take 1 capsule (200 mg total) by mouth every 8 (eight) hours. Patient not taking: Reported on 07/10/2017 01/19/17   Loney Hering, MD  meloxicam (MOBIC) 15 MG tablet TAKE 1 TABLET (15 MG TOTAL) BY MOUTH DAILY. Patient not taking: Reported on 07/10/2017 03/19/17   Birdie Sons, MD  omeprazole (PRILOSEC) 40 MG capsule omeprazole 40 mg capsule,delayed release    [provider]  ondansetron (ZOFRAN ODT) 4 MG disintegrating tablet Take 1 tablet (4 mg total) by  mouth every 8 (eight) hours as needed for nausea or vomiting. Patient not taking: Reported on 08/26/2017 01/19/17   Loney Hering, MD  Oxycodone HCl 10 MG TABS One pill every 4 hours as needed for pain. Not to exceed 6 pills in a 24 hour period. Patient not taking: Reported on 08/26/2017 07/29/17   Carmon Ginsberg, PA    Allergies as of 08/26/2017 - Review Complete 08/26/2017  Allergen Reaction Noted  . Compazine [prochlorperazine edisylate] Other (See Comments) 01/02/2016  . Ketorolac Palpitations 12/21/2015    Family History  Problem Relation Age of Onset  . Hypertension Mother   . Hyperlipidemia Mother   . Diabetes Mother   . Cancer Mother        breast cancer; 04/2011  . Colon polyps Mother   . Breast cancer Mother 54  . Hyperlipidemia Father   . Hypertension Father   . Colon polyps Father   . Hypertension Sister   . Hypertension Brother   . Cancer Maternal Uncle        terminal lung cancer  . Breast cancer Paternal Aunt        1's  . Breast cancer Paternal Grandmother   . Bladder Cancer Neg Hx   . Kidney cancer Neg Hx     Social History   Socioeconomic History  . Marital status: Single    Spouse name: Not on file  . Number of children: Not on file  . Years of education: Not on file  . Highest education level: Not on file  Occupational History    Comment: Full Time  Social Needs  . Financial resource strain: Not on file  . Food insecurity:    Worry: Not on file    Inability: Not on file  . Transportation needs:    Medical: Not on file    Non-medical: Not on file  Tobacco Use  . Smoking status: Former Smoker    Packs/day: 0.00    Years: 8.00    Pack years: 0.00    Types: Cigarettes    Last attempt to quit: 06/04/1999    Years since quitting: 18.2  . Smokeless tobacco: Never Used  Substance and Sexual Activity  . Alcohol use: No    Comment: 1 drink every few months  . Drug use: No  . Sexual activity: Not on file  Lifestyle  . Physical activity:     Days per week: Not on file    Minutes per session: Not on file  . Stress: Not on file  Relationships  . Social connections:    Talks on phone: Not on file    Gets together: Not on file    Attends religious service: Not on file    Active member of club or organization: Not on file    Attends meetings of clubs or organizations: Not on file    Relationship status: Not on file  . Intimate partner violence:  Fear of current or ex partner: Not on file    Emotionally abused: Not on file    Physically abused: Not on file    Forced sexual activity: Not on file  Other Topics Concern  . Not on file  Social History Narrative   No regular exercise          Review of Systems: See HPI, otherwise negative ROS  Physical Exam: Ht 5\' 9"  (1.753 m)   Wt 192 lb (87.1 kg)   LMP 12/14/2014 (Exact Date)   BMI 28.35 kg/m  General:   Alert,  pleasant and cooperative in NAD Head:  Normocephalic and atraumatic. Neck:  Supple; no masses or thyromegaly. Lungs:  Clear throughout to auscultation.    Heart:  Regular rate and rhythm. Abdomen:  Soft, nontender and nondistended. Normal bowel sounds, without guarding, and without rebound.   Neurologic:  Alert and  oriented x4;  grossly normal neurologically.  Impression/Plan: JHOSELIN CRUME is here for an endoscopy to be performed for dysphagia  Risks, benefits, limitations, and alternatives regarding  endoscopy have been reviewed with the patient.  Questions have been answered.  All parties agreeable.   [name], MD  08/29/2017, 10:21 AM

## 2017-08-29 NOTE — Anesthesia Procedure Notes (Signed)
Procedure Name: MAC Performed by: Lind Guest, CRNA Pre-anesthesia Checklist: Patient identified, Emergency Drugs available, Suction available, Patient being monitored and Timeout performed Patient Re-evaluated:Patient Re-evaluated prior to induction Oxygen Delivery Method: Nasal cannula

## 2017-09-08 ENCOUNTER — Other Ambulatory Visit: Payer: Self-pay | Admitting: Family Medicine

## 2017-09-08 DIAGNOSIS — M5136 Other intervertebral disc degeneration, lumbar region: Secondary | ICD-10-CM

## 2017-09-08 DIAGNOSIS — M5116 Intervertebral disc disorders with radiculopathy, lumbar region: Secondary | ICD-10-CM

## 2017-09-08 MED ORDER — OXYCODONE HCL 10 MG PO TABS: ORAL_TABLET | ORAL | 0 refills | Status: DC

## 2017-09-08 NOTE — Telephone Encounter (Signed)
Please review. KW 

## 2017-09-15 ENCOUNTER — Encounter: Payer: Self-pay | Admitting: Family Medicine

## 2017-09-15 ENCOUNTER — Other Ambulatory Visit: Payer: Self-pay | Admitting: Family Medicine

## 2017-09-15 DIAGNOSIS — F410 Panic disorder [episodic paroxysmal anxiety] without agoraphobia: Secondary | ICD-10-CM

## 2017-09-15 DIAGNOSIS — F419 Anxiety disorder, unspecified: Secondary | ICD-10-CM

## 2017-09-15 MED ORDER — PAROXETINE HCL 40 MG PO TABS: 40.0000 mg | ORAL_TABLET | Freq: Every day | ORAL | 1 refills | Status: DC

## 2017-09-15 MED ORDER — CLONAZEPAM 0.5 MG PO TABS
ORAL_TABLET | ORAL | 5 refills | Status: DC
Start: 2017-09-15 — End: 2019-10-14

## 2017-10-06 ENCOUNTER — Other Ambulatory Visit: Payer: Self-pay | Admitting: Family Medicine

## 2017-10-06 DIAGNOSIS — M5136 Other intervertebral disc degeneration, lumbar region: Secondary | ICD-10-CM

## 2017-10-06 DIAGNOSIS — M5116 Intervertebral disc disorders with radiculopathy, lumbar region: Secondary | ICD-10-CM

## 2017-10-06 MED ORDER — OXYCODONE HCL 10 MG PO TABS: ORAL_TABLET | ORAL | 0 refills | Status: DC

## 2017-10-13 ENCOUNTER — Other Ambulatory Visit: Payer: Self-pay | Admitting: Family Medicine

## 2017-10-13 ENCOUNTER — Encounter: Payer: Self-pay | Admitting: Family Medicine

## 2017-10-13 MED ORDER — DOXEPIN HCL 6 MG PO TABS: ORAL_TABLET | ORAL | 0 refills | Status: DC

## 2017-10-14 ENCOUNTER — Encounter: Payer: Self-pay | Admitting: Family Medicine

## 2017-10-15 ENCOUNTER — Telehealth: Payer: Self-pay | Admitting: Family Medicine

## 2017-10-15 NOTE — Telephone Encounter (Signed)
Pt called back saying she was talking back and forth with Mikki Santee on My Chart yesterday about the new medication (does not know the name) for sleep that he had prescribed.  [facility] said [facility] was [facility] and she or they have not heard anything .  Pt's call back is [phone]  Thanks Con Memos

## 2017-10-15 NOTE — Telephone Encounter (Signed)
Left message for patient letting her know that pre-authorization has been sent and is pending. KW

## 2017-10-24 ENCOUNTER — Telehealth: Payer: Self-pay | Admitting: Family Medicine

## 2017-10-24 NOTE — Telephone Encounter (Signed)
Pt called saying a PA has been done on the medication for her sleep  She talked to some one last week but has not heard anything   Call back [phone]  Thanks steri

## 2017-10-24 NOTE — Telephone Encounter (Signed)
She said the name of the medication is Doxepin HCI 6 mg  She was told that the PA was pending.Marland Kitchen  teri

## 2017-10-29 NOTE — Telephone Encounter (Signed)
Pre-authorization was submitted successfully on 10/23/17, awaiting response from insurance company. KW

## 2017-11-04 ENCOUNTER — Other Ambulatory Visit: Payer: Self-pay | Admitting: Family Medicine

## 2017-11-04 ENCOUNTER — Encounter: Payer: Self-pay | Admitting: Family Medicine

## 2017-11-04 DIAGNOSIS — M5136 Other intervertebral disc degeneration, lumbar region: Secondary | ICD-10-CM

## 2017-11-04 DIAGNOSIS — M5116 Intervertebral disc disorders with radiculopathy, lumbar region: Secondary | ICD-10-CM

## 2017-11-05 ENCOUNTER — Other Ambulatory Visit: Payer: Self-pay | Admitting: Family Medicine

## 2017-11-05 ENCOUNTER — Telehealth: Payer: Self-pay

## 2017-11-05 MED ORDER — HYDROXYZINE HCL 25 MG PO TABS: ORAL_TABLET | ORAL | 1 refills | Status: DC

## 2017-11-05 MED ORDER — OXYCODONE HCL 10 MG PO TABS: ORAL_TABLET | ORAL | 0 refills | Status: DC

## 2017-11-05 NOTE — Telephone Encounter (Signed)
Patient called the office stating that she is very anxious and states that she feels that she is going to have a panic attack today at work. Patient states that they are short staffed today at work so she is filling in for another coworker. Patient states that she received your message and feels that therapy will not help with her issues, she states that she has a good support system at home and therapy wouldn't be needed. Patient states that she is needing [place] to authorize to have her Klonopin filled early, she states that she has been taking 1mg  and pharmacy wont let her fill medication till the 14th . [facility] [address],please review and advise. KW

## 2017-11-05 NOTE — Telephone Encounter (Signed)
I will send in a few hydroxyzine that she may use for anxiety or to help her sleep for now. If she would like to come in and discuss tapering down to a lower dose of oxycodone over the next few weeks it would be safer then to increase her clonazepam in the future.

## 2017-11-05 NOTE — Telephone Encounter (Signed)
Patient advised and verbalized understanding. KW 

## 2017-11-05 NOTE — Telephone Encounter (Signed)
Pt called saying she need a PA for Oxycodone and the pharmacy will be faxing the info.  Thanks,   -Mickel Baas

## 2017-11-06 NOTE — Telephone Encounter (Signed)
P.A pending. KW

## 2017-11-07 ENCOUNTER — Encounter: Payer: Self-pay | Admitting: Family Medicine

## 2017-11-07 ENCOUNTER — Other Ambulatory Visit: Payer: Self-pay | Admitting: Family Medicine

## 2017-11-07 DIAGNOSIS — M5136 Other intervertebral disc degeneration, lumbar region: Secondary | ICD-10-CM

## 2017-11-07 DIAGNOSIS — M5116 Intervertebral disc disorders with radiculopathy, lumbar region: Secondary | ICD-10-CM

## 2017-11-11 ENCOUNTER — Encounter: Payer: Self-pay | Admitting: Family Medicine

## 2017-11-11 ENCOUNTER — Telehealth: Payer: Self-pay | Admitting: Family Medicine

## 2017-11-11 NOTE — Telephone Encounter (Signed)
Patient advised thru my chart. KW

## 2017-11-11 NOTE — Telephone Encounter (Signed)
Pt is requesting update on prior authorization for Oxycodone HCl 10 MG TABS. Please advise. Thanks TNP

## 2017-11-13 ENCOUNTER — Telehealth: Payer: Self-pay

## 2017-11-13 ENCOUNTER — Encounter: Payer: Self-pay | Admitting: Family Medicine

## 2017-11-13 ENCOUNTER — Other Ambulatory Visit: Payer: Self-pay | Admitting: Family Medicine

## 2017-11-13 DIAGNOSIS — M5136 Other intervertebral disc degeneration, lumbar region: Secondary | ICD-10-CM

## 2017-11-13 DIAGNOSIS — M5116 Intervertebral disc disorders with radiculopathy, lumbar region: Secondary | ICD-10-CM

## 2017-11-13 MED ORDER — OXYCODONE HCL 10 MG PO TABS: ORAL_TABLET | ORAL | 0 refills | Status: DC

## 2017-11-13 NOTE — Telephone Encounter (Signed)
  Patient called stating she is going out of town tomorrow and needs to get her pain medicine.  She asked if there is any way you can call to see if it has been approved. Her call back # is  [phone] Thanks

## 2017-11-13 NOTE — Telephone Encounter (Signed)
According to [place] medication has been denied for the reason that patient needs a updated plan of care since office visit addressing matter was 101months ago. Patient advised. KW

## 2017-11-14 ENCOUNTER — Encounter: Payer: Medicaid Other | Admitting: Family Medicine

## 2017-11-14 ENCOUNTER — Encounter: Payer: Self-pay | Admitting: Family Medicine

## 2017-11-25 ENCOUNTER — Encounter: Payer: Self-pay | Admitting: Family Medicine

## 2017-11-25 ENCOUNTER — Other Ambulatory Visit: Payer: Self-pay | Admitting: Family Medicine

## 2017-11-27 ENCOUNTER — Encounter: Payer: Self-pay | Admitting: *Deleted

## 2017-11-27 NOTE — Progress Notes (Signed)
Patient has not returned my call or rescheduled her surgical consult.  She has Medicaid at this time.  Since mammogram was normal and nipple discharge was on expression only will close case as refusal to follow-up.  HSIS to [place].

## 2017-12-01 ENCOUNTER — Encounter: Payer: Self-pay | Admitting: Family Medicine

## 2017-12-01 ENCOUNTER — Ambulatory Visit: Payer: 59 | Admitting: Family Medicine

## 2017-12-01 VITALS — BP 122/68 | HR 72 | Temp 99.1°F | Resp 16 | Wt 189.0 lb

## 2017-12-01 DIAGNOSIS — M5116 Intervertebral disc disorders with radiculopathy, lumbar region: Secondary | ICD-10-CM | POA: Diagnosis not present

## 2017-12-01 DIAGNOSIS — F419 Anxiety disorder, unspecified: Secondary | ICD-10-CM | POA: Diagnosis not present

## 2017-12-01 MED ORDER — HYDROXYZINE HCL 25 MG PO TABS: ORAL_TABLET | ORAL | 2 refills | Status: DC

## 2017-12-01 MED ORDER — BUPROPION HCL ER (SR) 150 MG PO TB12: 150.0000 mg | ORAL_TABLET | Freq: Two times a day (BID) | ORAL | 0 refills | Status: DC

## 2017-12-01 NOTE — Patient Instructions (Signed)
Decreased paroxetine to 1/2 pill for a week then every other day for a week then discontinue. Phone follow up in 3-4 weeks. Do check with the [facility], Dr.Lateef, and see if they can see you.

## 2017-12-01 NOTE — Progress Notes (Signed)
  Subjective:     Patient ID: Misty Zamora, female   DOB: [birth year], 42 y.o.   MRN: [id number] Chief Complaint  Patient presents with  . Anxiety    Patient returns to office today for follow up visit from 07/10/17, patient states that she had had good compliance with Paxil but poor symptom control as of recent. Patient would like to discuss increasing medication or changing it to help with symptom control, patient states that her sex drive is decreassed as well and believes its the medication.   . Back Pain    Follow up from 07/10/17, patient reports good compliance with Oxycodone.    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 States she continues to have back pain on the left side which will radiate to her lower leg. No surgical lesion noted on last MRI in 2018. Reports that oxycodone takes away "100%" of the pain and allows her to work. States she also wishes to try Wellbutrin for her anxiety and feeling down as the end of the month approaches. LIves with her husband and 3 teenage children (16-17-19).  Review of Systems     Objective:   Physical Exam  Constitutional: She appears well-developed and well-nourished. No distress.  Musculoskeletal:  Muscle strength in lower extremities 5/5. SLR's to 90 degrees without radiation of back pain. Localizes to her left lower lumbar area and buttock area. No trochanteric tenderness appreciated.       Assessment:    1. Neuritis or radiculitis due to rupture of lumbar intervertebral disc: chronic pain management with oxycodone. Suggested she try [facility] again.   2. Anxiety: Will transition her to bupropion SR and taper off paroxetine.  Continue clonazepam low dose. - hydrOXYzine (ATARAX/VISTARIL) 25 MG tablet; One or two every 6 hours as needed for anxiety or sleep  Dispense: 60 tablet; Refill: 2    Plan:    Discussed paroxetine taper and phone f/u in 3-4 weeks. Discussed danger of  unintentional overdose by increasing clonazepam and oxycodone use beyond what is prescribed.

## 2017-12-02 ENCOUNTER — Telehealth: Payer: Self-pay

## 2017-12-02 NOTE — Telephone Encounter (Signed)
Patient requesting refill on Oxycodone HCI 10 mg, pt reports it is due for refill on 12/06/17. Patient reports she has changed insurance carrier and may need a PA with new insurance.

## 2017-12-02 NOTE — Telephone Encounter (Signed)
Since patient has new insurance I would need you to send in new request for refill to see if it kicks back needing a pre-authorization. Patient states that she has [employer] and no longer has medicaid. KW

## 2017-12-03 ENCOUNTER — Encounter: Payer: Self-pay | Admitting: Family Medicine

## 2017-12-03 ENCOUNTER — Other Ambulatory Visit: Payer: Self-pay | Admitting: Family Medicine

## 2017-12-03 DIAGNOSIS — M51369 Other intervertebral disc degeneration, lumbar region without mention of lumbar back pain or lower extremity pain: Secondary | ICD-10-CM

## 2017-12-03 DIAGNOSIS — M5136 Other intervertebral disc degeneration, lumbar region: Secondary | ICD-10-CM

## 2017-12-03 DIAGNOSIS — M5116 Intervertebral disc disorders with radiculopathy, lumbar region: Secondary | ICD-10-CM

## 2017-12-03 MED ORDER — OXYCODONE HCL 10 MG PO TABS: ORAL_TABLET | ORAL | 0 refills | Status: DC

## 2017-12-03 NOTE — Telephone Encounter (Signed)
done

## 2017-12-11 ENCOUNTER — Telehealth: Payer: Self-pay | Admitting: Family Medicine

## 2017-12-11 NOTE — Telephone Encounter (Signed)
She has  Folder on her desk for prior authorizations. I know she was working on it and should have submitted it.

## 2017-12-11 NOTE — Telephone Encounter (Signed)
Pt is requesting call back for a status update of her PA for Oxycodone HCl 10 MG TABS. Pt was advised that Nunzio Cory is out of the office today. Please advise. Thanks TNP

## 2017-12-12 ENCOUNTER — Other Ambulatory Visit: Payer: Self-pay | Admitting: Family Medicine

## 2017-12-12 DIAGNOSIS — M5136 Other intervertebral disc degeneration, lumbar region: Secondary | ICD-10-CM

## 2017-12-12 DIAGNOSIS — M5116 Intervertebral disc disorders with radiculopathy, lumbar region: Secondary | ICD-10-CM

## 2017-12-12 MED ORDER — OXYCODONE HCL 10 MG PO TABS: ORAL_TABLET | ORAL | 0 refills | Status: DC

## 2017-12-12 NOTE — Telephone Encounter (Signed)
Please update Gerry.

## 2017-12-12 NOTE — Telephone Encounter (Signed)
I am resubmitting pre-authorization again today, when initially sent patient still had [employer] and pre-authorization form cent from [facility] said that [place] rejected and medicaid was pending so I resent it through [place]. Medicaid is now saying she no linger has coverage and therefore oxycodone is denied. Im sending in a pre-authorization through [facility] to submit to [facility]. KW

## 2017-12-12 NOTE — Telephone Encounter (Signed)
Per pharmacy old prescription is void since patient didn't fill it, Mikki Santee approved for new script for 7 days and patient was advised. KW

## 2017-12-12 NOTE — Telephone Encounter (Signed)
Pt stated that she was returning Kat's call and that she only has 2 tablets of Oxycodone HCl 10 MG TABS remaining. Pt is requesting another 7 day supply be sent to [facility] since the PA hasn't been completed. Please advise. Thanks TNP

## 2017-12-12 NOTE — Telephone Encounter (Signed)
I sent response email to her on mychart. KW

## 2017-12-17 ENCOUNTER — Telehealth: Payer: Self-pay | Admitting: Family Medicine

## 2017-12-17 NOTE — Telephone Encounter (Signed)
Pt is requesting status update for PA for Oxycodone HCl 10 MG TABS. Please advise. Thanks TNP

## 2017-12-17 NOTE — Telephone Encounter (Signed)
Pre-authorization has been denied, please advise if you would like patient to come back in office to discuss alternative. KW

## 2017-12-18 ENCOUNTER — Other Ambulatory Visit: Payer: Self-pay | Admitting: Family Medicine

## 2017-12-18 DIAGNOSIS — M5116 Intervertebral disc disorders with radiculopathy, lumbar region: Secondary | ICD-10-CM

## 2017-12-18 DIAGNOSIS — M545 Low back pain, unspecified: Secondary | ICD-10-CM

## 2017-12-18 DIAGNOSIS — G8929 Other chronic pain: Secondary | ICD-10-CM

## 2017-12-18 DIAGNOSIS — M5136 Other intervertebral disc degeneration, lumbar region: Secondary | ICD-10-CM

## 2017-12-18 MED ORDER — OXYCODONE HCL 10 MG PO TABS: ORAL_TABLET | ORAL | 0 refills | Status: DC

## 2017-12-18 NOTE — Telephone Encounter (Signed)
Discussed referral to pain clinic and tapering oxycodone down to 5 pills daily. Insurance will not cover the amounts she is using.

## 2017-12-22 IMAGING — MG MM SCREENING BREAST TOMO BILATERAL
8 of 12 series · 8 of 28 positions shown · non-contrast
Comparison: Previous exam(s).

CLINICAL DATA: Screening.

EXAM:
DIGITAL SCREENING BILATERAL MAMMOGRAM WITH 3D TOMO WITH CAD

[R MLO]
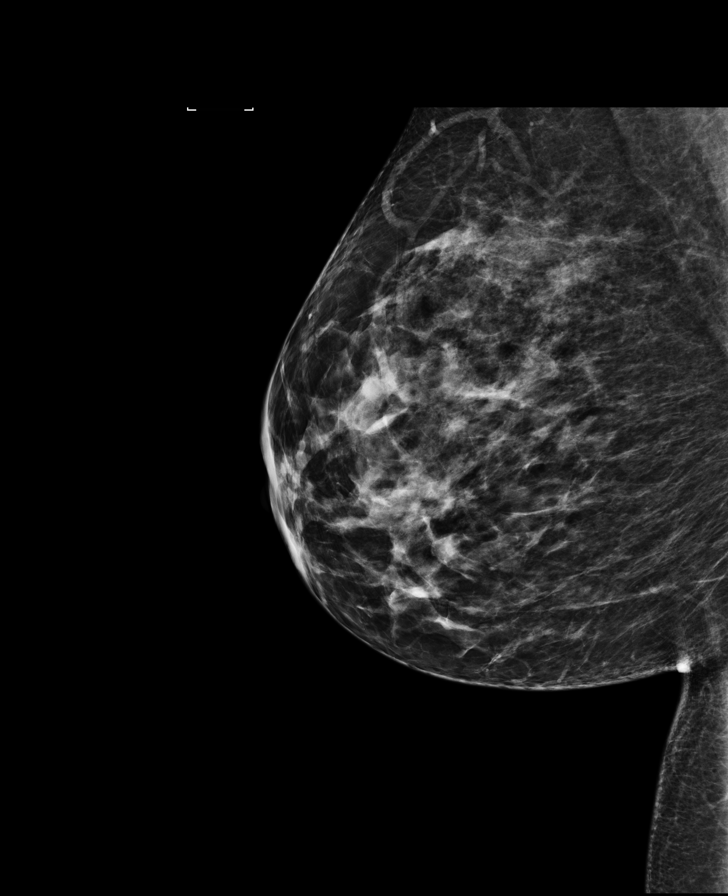

[R CC synth-2D]
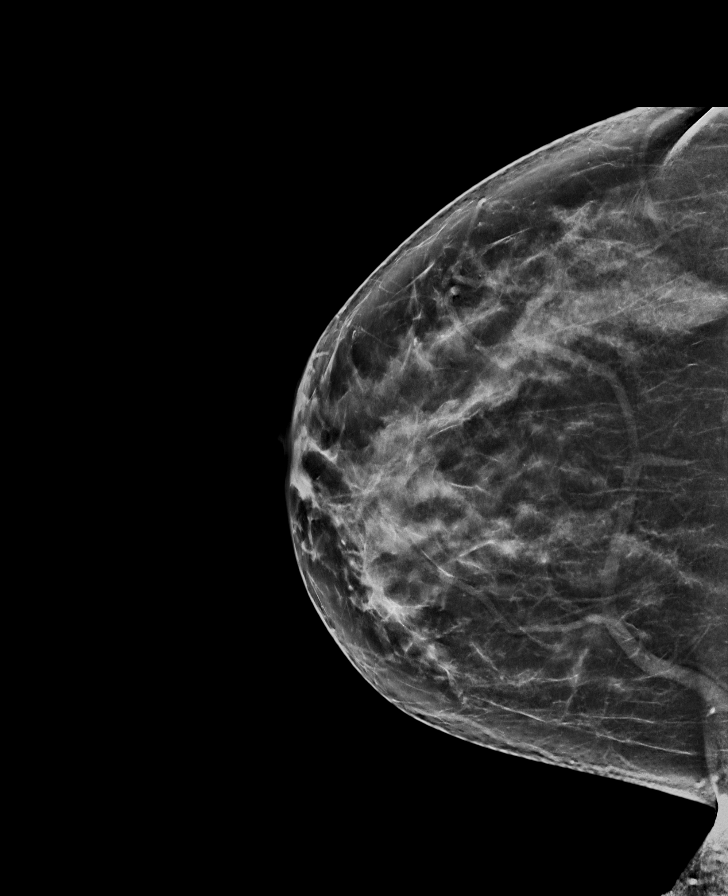

[L CC synth-2D]
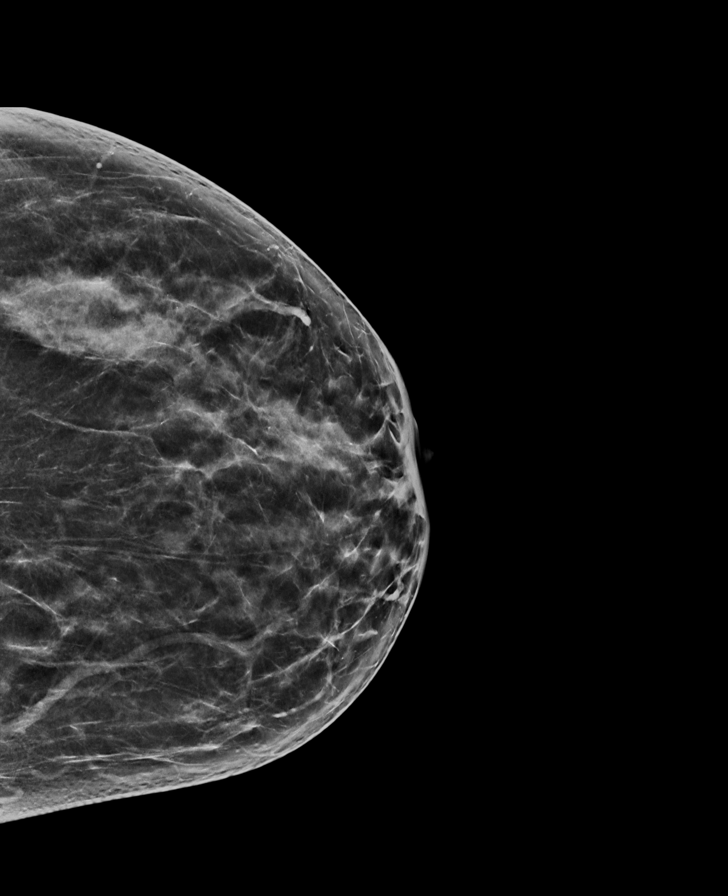

[L MLO synth-2D]
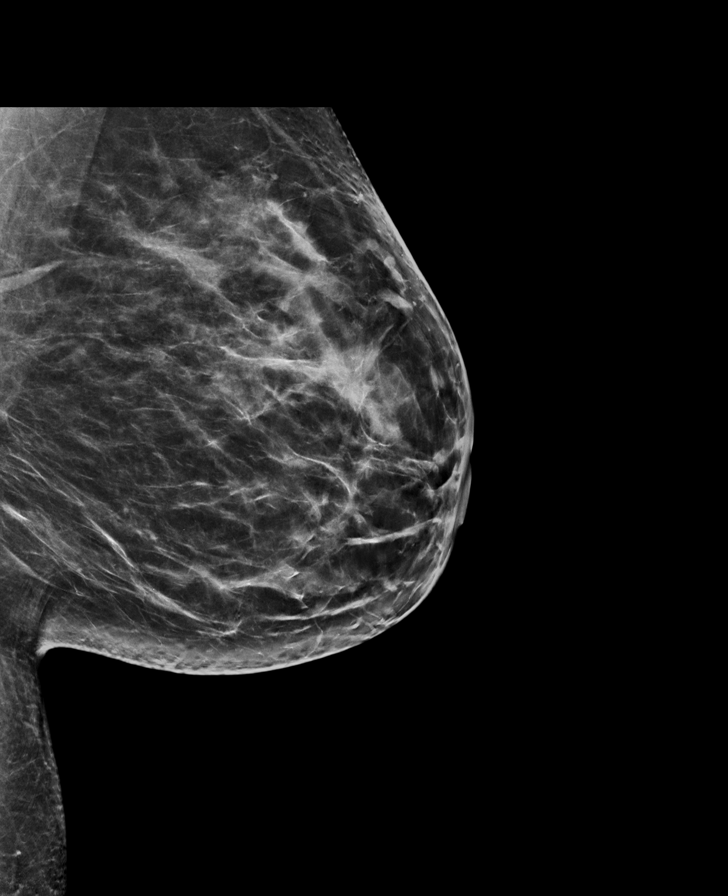

[L CC]
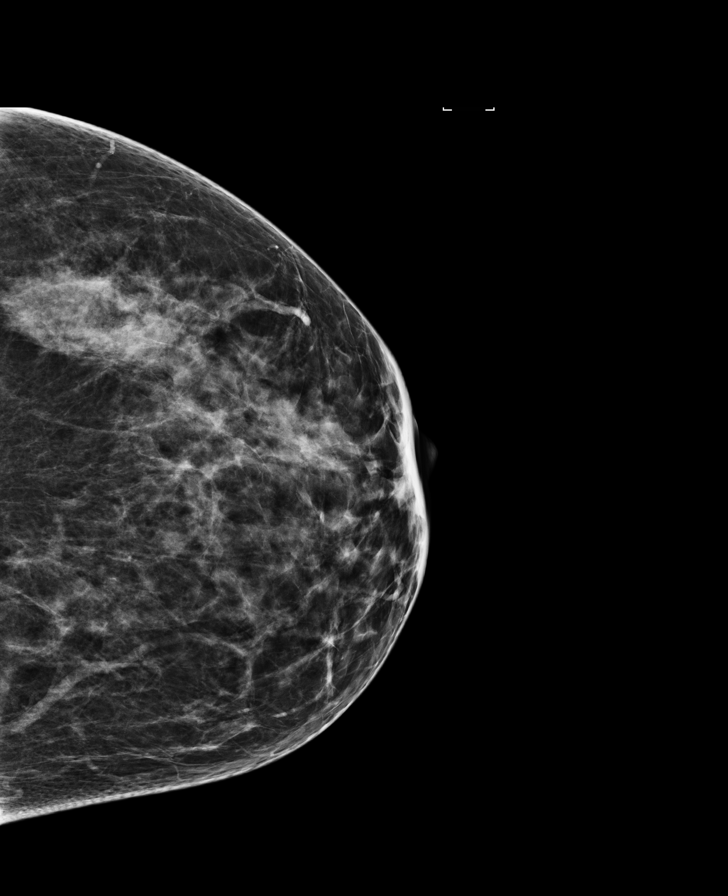

[L MLO]
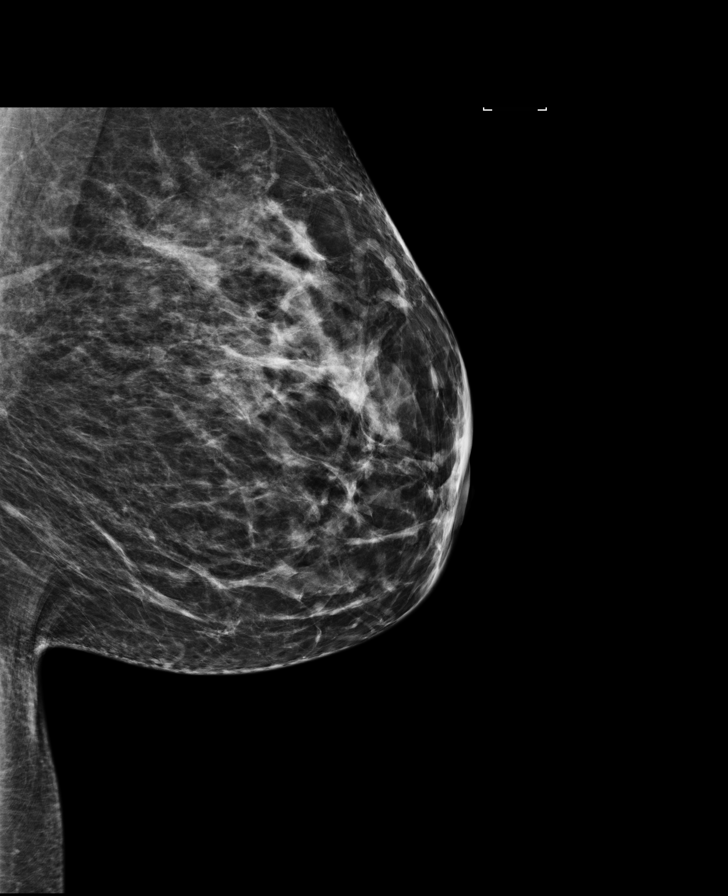

[R MLO synth-2D]
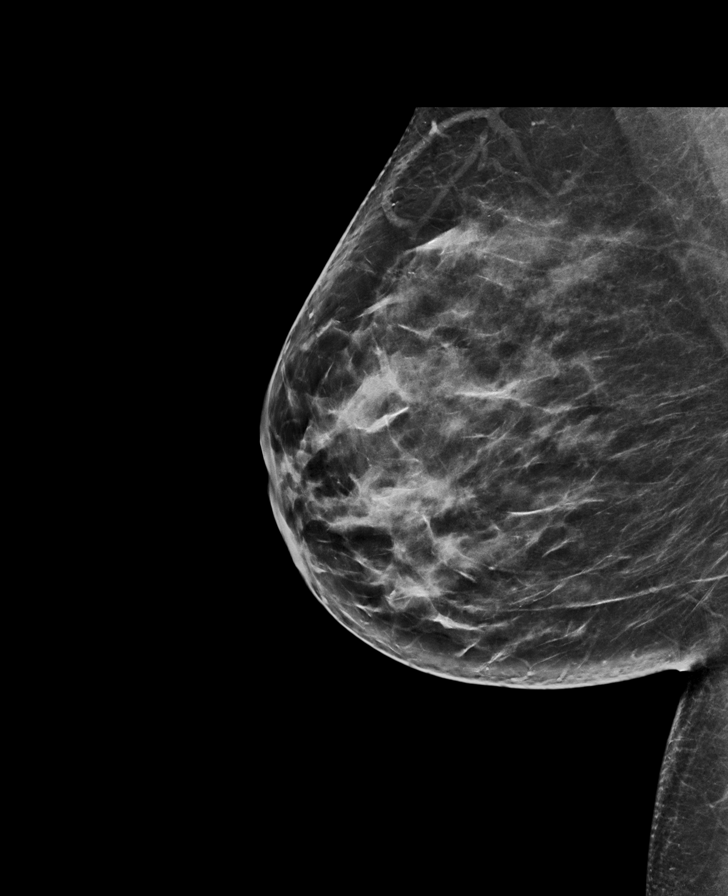

[R CC]
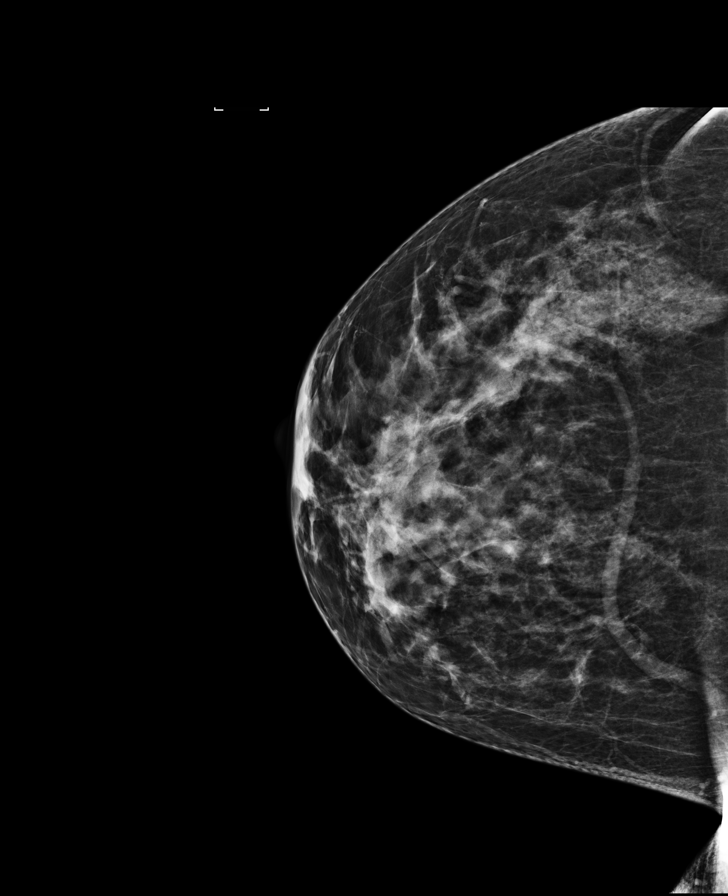

[8 of 28 positions shown; findings below may reference images not displayed]

ACR Breast Density Category c: The breast tissue is heterogeneously
dense, which may obscure small masses.
FINDINGS: There are no findings suspicious for malignancy. Images were
processed with CAD.
IMPRESSION: No mammographic evidence of malignancy. A result letter of this
screening mammogram will be mailed directly to the patient.

RECOMMENDATION:
Screening mammogram at age 42. (Code:A3-O-8HC)

BI-RADS CATEGORY  1: Negative.

## 2017-12-23 ENCOUNTER — Other Ambulatory Visit: Payer: Self-pay | Admitting: Family Medicine

## 2017-12-29 ENCOUNTER — Telehealth: Payer: Self-pay | Admitting: Family Medicine

## 2017-12-29 NOTE — Telephone Encounter (Signed)
Reports increased anxiety and feeling "scared". States she was in the paper recently over a Electrical engineer with [facility]. Suggested adding hydroxyzine as needed and continue with clonazepam and bupropion SR at present doses. Also wishes [place] to check on [facility] referral.

## 2017-12-29 NOTE — Telephone Encounter (Signed)
Please review. KW 

## 2017-12-29 NOTE — Telephone Encounter (Signed)
Pt request Misty Zamora return her call. 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 Please advise. Thanks TNP

## 2017-12-29 NOTE — Telephone Encounter (Signed)
Pt called back requesting to speak with Misty Zamora or Misty Zamora. Pt stated that she has been in a lot of pain over the weekend with a lot of nerve pain. Pt also stated that she that she is upset emotionally because was in the news paper unexpectedly and that is adding to her stress and anxiety. Pt request to speak with Misty Zamora or Misty Zamora as soon as possible. Please advise. Thanks TNP

## 2018-01-05 ENCOUNTER — Other Ambulatory Visit: Payer: Self-pay | Admitting: Student

## 2018-01-05 DIAGNOSIS — M5442 Lumbago with sciatica, left side: Secondary | ICD-10-CM

## 2018-01-06 ENCOUNTER — Other Ambulatory Visit: Payer: Self-pay | Admitting: Student

## 2018-01-06 DIAGNOSIS — G8929 Other chronic pain: Secondary | ICD-10-CM

## 2018-01-06 DIAGNOSIS — M5442 Lumbago with sciatica, left side: Principal | ICD-10-CM

## 2018-01-07 ENCOUNTER — Other Ambulatory Visit: Payer: Self-pay

## 2018-01-07 ENCOUNTER — Ambulatory Visit: Payer: 59 | Admitting: Gastroenterology

## 2018-01-07 ENCOUNTER — Encounter: Payer: Self-pay | Admitting: Emergency Medicine

## 2018-01-07 ENCOUNTER — Encounter: Payer: Self-pay | Admitting: Gastroenterology

## 2018-01-07 VITALS — BP 97/66 | HR 74 | Resp 17 | Wt 196.0 lb

## 2018-01-07 DIAGNOSIS — K9189 Other postprocedural complications and disorders of digestive system: Secondary | ICD-10-CM | POA: Diagnosis not present

## 2018-01-07 DIAGNOSIS — R1310 Dysphagia, unspecified: Secondary | ICD-10-CM

## 2018-01-07 DIAGNOSIS — D508 Other iron deficiency anemias: Secondary | ICD-10-CM

## 2018-01-07 DIAGNOSIS — Z9884 Bariatric surgery status: Secondary | ICD-10-CM

## 2018-01-07 DIAGNOSIS — R1319 Other dysphagia: Secondary | ICD-10-CM

## 2018-01-07 NOTE — Progress Notes (Signed)
Cephas Darby, MD [address]  [place]  [place], [place] [address]  Main: [phone]  Fax: [phone] Pager: [phone]   Primary Care Physician: Carmon Ginsberg, [place]  Primary Gastroenterologist:  Dr. [name]  42 year old  female with history of Roux-en-Y gastric bypass, underwent revision in 08/2017. The patient subsequently developed poor by mouth intake, nausea and vomiting. EGD revealed small gastric pouch with stenosis of the gastrojejunal anastomosis and underwent dilation by Dr. Allen Norris to 46mm. She was feeling significantly better and was able to tolerate solid food until 2 weeks ago. She is not able to tolerate any solid food now. She is here to discuss about repeat EGD as Dr. Allen Norris is on vacation   She has history of severe iron deficiency    Chief Complaint  Patient presents with  . Dysphagia  . Abdominal Pain    trouble gettign food down, hx of gastric bypass    HPI: Misty Zamora is a 42 y.o. female  Current Outpatient Medications  Medication Sig Dispense Refill  . albuterol (PROVENTIL HFA;VENTOLIN HFA) 108 (90 Base) MCG/ACT inhaler Inhale 2 puffs into the lungs every 6 (six) hours as needed for wheezing or shortness of breath. 1 Inhaler 0  . buPROPion (WELLBUTRIN SR) 150 MG 12 hr tablet TAKE 1 TABLET (150 MG TOTAL) BY MOUTH 2 (TWO) TIMES DAILY. START AT 1 PILL DAILY FOR THE FIRST WEEK. 180 tablet 0  . cefdinir (OMNICEF) 300 MG capsule TAKE 1 CAPSULE BY MOUTH TWICE A DAY FOR 2 WEEKS  0  . clonazePAM (KLONOPIN) 0.5 MG tablet TAKE 1 TABLET BY MOUTH TWICE A DAY AS NEEDED FOR ANXIETY 60 tablet 5  . hydrOXYzine (ATARAX/VISTARIL) 25 MG tablet One or two every 6 hours as needed for anxiety or sleep 60 tablet 2  . Multiple Vitamins-Minerals (BARIATRIC MULTIVITAMINS/IRON PO) Take by mouth daily.    . Oxycodone HCl 10 MG TABS One pill every 4 hours as needed for pain. Not to exceed 5 pills in a 24 hour period. 150 tablet 0  . Vitamin D, Ergocalciferol,  (DRISDOL) 50000 units CAPS capsule TAKE ONE CAPSULE BY MOUTH ONE TIME PER WEEK  4   No current facility-administered medications for this visit.     Allergies as of 01/07/2018 - Review Complete 01/07/2018  Allergen Reaction Noted  . Compazine [prochlorperazine edisylate] Other (See Comments) 01/02/2016  . Ketorolac Palpitations 12/21/2015    NSAIDs: None  Antiplts/Anticoagulants/Anti thrombotics: None  GI procedures: EGD 08/2017 - Normal esophagus. - Gastric bypass with a small-sized pouch and intact staple line. Gastrojejunal anastomosis characterized by ulceration and severe stenosis. Dilated. - Normal examined jejunum. 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 - No specimens collected.   ROS:  General: Negative for anorexia, weight loss, fever, chills, fatigue, weakness. ENT: Negative for hoarseness, difficulty swallowing , nasal congestion. CV: Negative for chest pain, angina, palpitations, dyspnea on exertion, peripheral edema.  Respiratory: Negative for dyspnea at rest, dyspnea on exertion, cough, sputum, wheezing.  GI: See history of present illness. GU:  Negative for dysuria, hematuria, urinary incontinence, urinary frequency, nocturnal urination.  Endo: Negative for unusual weight change.    Physical Examination:   BP 97/66 (BP Location: Left Arm, Patient Position: Sitting, Cuff Size: Large)   Pulse 74   Resp 17   Wt 196 lb (88.9 kg)   LMP 12/14/2014 (Exact Date)   BMI 28.94 kg/m   General: Well-nourished, well-developed in no acute distress.  Eyes: No icterus. Conjunctivae pink.  Mouth: Oropharyngeal mucosa moist and pink , no lesions erythema or exudate. Lungs: Clear to auscultation bilaterally. Non-labored. Heart: Regular rate and rhythm, no murmurs rubs or gallops.  Abdomen: Bowel sounds are normal, nontender, nondistended, no hepatosplenomegaly or masses, no hernia , no rebound or guarding.   Extremities: No lower extremity edema. No clubbing or  deformities. Neuro: Alert and oriented x 3.  Grossly intact. Skin: Warm and dry, no jaundice.   Psych: Alert and cooperative, normal mood and affect.   Imaging Studies: No results found.  Assessment and Plan:   Misty Zamora is a 42 y.o. female history of gastric bypass in 2012, status post revision in 08/2017 at [facility], status post EGD with dilation of the gastrojejunal anastomotic stenosis is seen for follow-up of recurrence of nausea and vomiting and unable to tolerate intake of solid food She probably may have developed recurrence of the stricture  -Schedule EGD with dilation and she may need serial dilations at closer intervals  Severe iron deficiency secondary to gastric bypass There is no evidence of anemia Recommend hematology referral for parenteral iron therapy   Follow up in 4 weeks with Dr. Allen Norris    Dr Sherri Sear, MD

## 2018-01-08 ENCOUNTER — Encounter: Payer: Self-pay | Admitting: Anesthesiology

## 2018-01-08 ENCOUNTER — Ambulatory Visit: Payer: 59 | Admitting: Anesthesiology

## 2018-01-08 ENCOUNTER — Encounter
Admission: RE | Disposition: A | Discharge status: Home / Self Care | Payer: Self-pay | Source: Ambulatory Visit | Attending: Gastroenterology

## 2018-01-08 ENCOUNTER — Other Ambulatory Visit: Payer: Self-pay

## 2018-01-08 ENCOUNTER — Ambulatory Visit
Admission: RE | Admit: 2018-01-08 | Discharge: 2018-01-08 | Disposition: A | Discharge status: Home / Self Care | Payer: 59 | Source: Ambulatory Visit | Attending: Gastroenterology | Admitting: Gastroenterology

## 2018-01-08 DIAGNOSIS — R1110 Vomiting, unspecified: Secondary | ICD-10-CM | POA: Insufficient documentation

## 2018-01-08 DIAGNOSIS — K449 Diaphragmatic hernia without obstruction or gangrene: Secondary | ICD-10-CM | POA: Diagnosis not present

## 2018-01-08 DIAGNOSIS — Z79899 Other long term (current) drug therapy: Secondary | ICD-10-CM | POA: Insufficient documentation

## 2018-01-08 DIAGNOSIS — F419 Anxiety disorder, unspecified: Secondary | ICD-10-CM | POA: Diagnosis not present

## 2018-01-08 DIAGNOSIS — I10 Essential (primary) hypertension: Secondary | ICD-10-CM | POA: Diagnosis not present

## 2018-01-08 DIAGNOSIS — Z87891 Personal history of nicotine dependence: Secondary | ICD-10-CM | POA: Insufficient documentation

## 2018-01-08 DIAGNOSIS — Z9884 Bariatric surgery status: Secondary | ICD-10-CM | POA: Insufficient documentation

## 2018-01-08 DIAGNOSIS — K9189 Other postprocedural complications and disorders of digestive system: Secondary | ICD-10-CM | POA: Diagnosis not present

## 2018-01-08 DIAGNOSIS — R1310 Dysphagia, unspecified: Secondary | ICD-10-CM

## 2018-01-08 DIAGNOSIS — Y832 Surgical operation with anastomosis, bypass or graft as the cause of abnormal reaction of the patient, or of later complication, without mention of misadventure at the time of the procedure: Secondary | ICD-10-CM | POA: Diagnosis not present

## 2018-01-08 DIAGNOSIS — G4730 Sleep apnea, unspecified: Secondary | ICD-10-CM | POA: Insufficient documentation

## 2018-01-08 DIAGNOSIS — R1319 Other dysphagia: Secondary | ICD-10-CM

## 2018-01-08 DIAGNOSIS — F329 Major depressive disorder, single episode, unspecified: Secondary | ICD-10-CM | POA: Insufficient documentation

## 2018-01-08 DIAGNOSIS — J45909 Unspecified asthma, uncomplicated: Secondary | ICD-10-CM | POA: Insufficient documentation

## 2018-01-08 DIAGNOSIS — Z79891 Long term (current) use of opiate analgesic: Secondary | ICD-10-CM | POA: Diagnosis not present

## 2018-01-08 HISTORY — PX: ESOPHAGOGASTRODUODENOSCOPY (EGD) WITH PROPOFOL: SHX5813

## 2018-01-08 SURGERY — ESOPHAGOGASTRODUODENOSCOPY (EGD) WITH PROPOFOL
Anesthesia: General

## 2018-01-08 MED ORDER — SODIUM CHLORIDE 0.9 % IV SOLN
INTRAVENOUS | Status: DC
  Administered 2018-01-08: 13:00:00 via INTRAVENOUS

## 2018-01-08 MED ORDER — LIDOCAINE HCL (PF) 1 % IJ SOLN
INTRAMUSCULAR | Status: AC
  Filled 2018-01-08: qty 2

## 2018-01-08 MED ORDER — PROPOFOL 500 MG/50ML IV EMUL
INTRAVENOUS | Status: DC | PRN
  Administered 2018-01-08: 150 ug/kg/min via INTRAVENOUS

## 2018-01-08 MED ORDER — FENTANYL CITRATE (PF) 100 MCG/2ML IJ SOLN
INTRAMUSCULAR | Status: AC
  Filled 2018-01-08: qty 2

## 2018-01-08 MED ORDER — ONDANSETRON HCL 4 MG/2ML IJ SOLN
INTRAMUSCULAR | Status: AC
  Filled 2018-01-08: qty 2

## 2018-01-08 MED ORDER — LIDOCAINE HCL (PF) 2 % IJ SOLN
INTRAMUSCULAR | Status: AC
  Filled 2018-01-08: qty 10

## 2018-01-08 MED ORDER — PROPOFOL 500 MG/50ML IV EMUL
INTRAVENOUS | Status: AC
  Filled 2018-01-08: qty 50

## 2018-01-08 MED ORDER — PROPOFOL 10 MG/ML IV BOLUS
INTRAVENOUS | Status: DC | PRN
  Administered 2018-01-08: 50 mg via INTRAVENOUS
  Administered 2018-01-08: 60 mg via INTRAVENOUS
  Administered 2018-01-08: 80 mg via INTRAVENOUS
  Administered 2018-01-08: 60 mg via INTRAVENOUS

## 2018-01-08 MED ORDER — LIDOCAINE HCL (CARDIAC) PF 100 MG/5ML IV SOSY
PREFILLED_SYRINGE | INTRAVENOUS | Status: DC | PRN
  Administered 2018-01-08: 40 mg via INTRAVENOUS

## 2018-01-08 MED ORDER — MIDAZOLAM HCL 2 MG/2ML IJ SOLN
INTRAMUSCULAR | Status: AC
  Filled 2018-01-08: qty 2

## 2018-01-08 NOTE — Anesthesia Postprocedure Evaluation (Signed)
Anesthesia Post Note  Patient: Misty Zamora  Procedure(s) Performed: ESOPHAGOGASTRODUODENOSCOPY (EGD) WITH PROPOFOL (N/A )  Patient location during evaluation: Endoscopy Anesthesia Type: General Level of consciousness: awake and alert Pain management: pain level controlled Vital Signs Assessment: post-procedure vital signs reviewed and stable Respiratory status: spontaneous breathing, nonlabored ventilation, respiratory function stable and patient connected to nasal cannula oxygen Cardiovascular status: blood pressure returned to baseline and stable Postop Assessment: no apparent nausea or vomiting Anesthetic complications: no     Last Vitals:  Vitals:   01/08/18 1333 01/08/18 1343  BP: (!) 166/89 (!) 130/98  Pulse:  70  Resp:  16  Temp:    SpO2:  98%    Last Pain:  Vitals:   01/08/18 1343  TempSrc:   PainSc: 0-No pain                 Martha Clan

## 2018-01-08 NOTE — H&P (Signed)
Cephas Darby, MD [address]  [place]  [place], [place] [address]  Main: [phone]  Fax: [phone] Pager: [phone]  Primary Care Physician:  Carmon Ginsberg, [place] Primary Gastroenterologist:  Dr. Cephas Darby  Pre-Procedure History & Physical: HPI:  Misty Zamora is a 42 y.o. female is here for an endoscopy.   Past Medical History:  Diagnosis Date  . Anemia    had iron infusions. last one in Sept. told levels are now normal  . Anxiety   . 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 Bulging lumbar disc    causes bilateral leg pain  . Depression   . Family history of adverse reaction to anesthesia    Father - PONV  . GERD (gastroesophageal reflux disease)    occasional  . Heart murmur 2005  . History of cardiovascular stress test 2005   showed MR and TR  . History of kidney stones   . Hypertension    no meds since gastric bypass  . IDA (iron deficiency anemia)   . PONV (postoperative nausea and vomiting)    hypotension with epidural  . Sleep apnea    was better after gastric bypass, hasd recently started snoring again.  . Wears contact lenses     Past Surgical History:  Procedure Laterality Date  . ABDOMINAL HYSTERECTOMY  12/2014   ovaries in situ  . BALLOON DILATION  08/29/2017   Procedure: BALLOON DILATION;  Surgeon: [name], MD;  Location: [facility];  Service: Endoscopy;;  . BILATERAL SALPINGECTOMY Bilateral 12/27/2014   Procedure: BILATERAL SALPINGECTOMY;  Surgeon: Will Bonnet, MD;  Location: [facility];  Service: Gynecology;  Laterality: Bilateral;  . COLON SURGERY  08/01/2017   Resection and revision of Bypass - [facility], Dr Duke Salvia  . COLONOSCOPY  2010  . CYSTOSCOPY N/A 12/27/2014   Procedure: CYSTOSCOPY;  Surgeon: Will Bonnet, MD;  Location: [facility];  Service: Gynecology;  Laterality: N/A;  . DILATION AND CURETTAGE OF UTERUS    . ELBOW ARTHROSCOPY Left 03/30/2015   Procedure: ARTHROSCOPY ELBOW WITH DEBRIDEMENT  OF THE SYMPTOMATIC PLICA;  Surgeon: Corky Mull, MD;  Location: [facility];  Service: Orthopedics;  Laterality: Left;  . ELBOW SURGERY Left   . ERCP    . ESOPHAGOGASTRODUODENOSCOPY (EGD) WITH PROPOFOL N/A 06/06/2016   Procedure: ESOPHAGOGASTRODUODENOSCOPY (EGD) WITH PROPOFOL;  Surgeon: [name], MD;  Location: [place] [facility];  Service: Endoscopy;  Laterality: N/A;  . ESOPHAGOGASTRODUODENOSCOPY (EGD) WITH PROPOFOL N/A 08/29/2017   Procedure: ESOPHAGOGASTRODUODENOSCOPY (EGD) WITH PROPOFOL;  Surgeon: [name], MD;  Location: [facility];  Service: Endoscopy;  Laterality: N/A;  . gallbladder removed    . GASTRIC BYPASS  2012  . LAPAROSCOPIC HYSTERECTOMY N/A 12/27/2014   Procedure: HYSTERECTOMY TOTAL LAPAROSCOPIC;  Surgeon: Will Bonnet, MD;  Location: [facility];  Service: Gynecology;  Laterality: N/A;  . laproscopy    . RENAL ENDOSCOPY VIA NEPHROSTOMY / PYELOSTOMY  2010  . TUBAL LIGATION      Prior to Admission medications   Medication Sig Start Date End Date Taking? Authorizing Provider  albuterol (PROVENTIL HFA;VENTOLIN HFA) 108 (90 Base) MCG/ACT inhaler Inhale 2 puffs into the lungs every 6 (six) hours as needed for wheezing or shortness of breath. 04/07/16   Nance Pear, MD  buPROPion (WELLBUTRIN SR) 150 MG 12 hr tablet TAKE 1 TABLET (150 MG TOTAL) BY MOUTH 2 (TWO) TIMES DAILY. START AT 1 PILL DAILY FOR THE FIRST WEEK. 12/23/17  Carmon Ginsberg, PA  cefdinir (OMNICEF) 300 MG capsule TAKE 1 CAPSULE BY MOUTH TWICE A DAY FOR 2 WEEKS 12/29/17   [provider]  clonazePAM (KLONOPIN) 0.5 MG tablet TAKE 1 TABLET BY MOUTH TWICE A DAY AS NEEDED FOR ANXIETY 09/15/17   Carmon Ginsberg, PA  hydrOXYzine (ATARAX/VISTARIL) 25 MG tablet One or two every 6 hours as needed for anxiety or sleep 12/01/17   Carmon Ginsberg, PA  Multiple Vitamins-Minerals (BARIATRIC MULTIVITAMINS/IRON PO) Take by mouth daily.    [provider]  Oxycodone HCl 10 MG TABS One pill  every 4 hours as needed for pain. Not to exceed 5 pills in a 24 hour period. 12/18/17   Carmon Ginsberg, PA  Vitamin D, Ergocalciferol, (DRISDOL) 50000 units CAPS capsule TAKE ONE CAPSULE BY MOUTH ONE TIME PER WEEK 12/10/17   [provider]    Allergies as of 01/07/2018 - Review Complete 01/07/2018  Allergen Reaction Noted  . Compazine [prochlorperazine edisylate] Other (See Comments) 01/02/2016  . Ketorolac Palpitations 12/21/2015    Family History  Problem Relation Age of Onset  . Hypertension Mother   . Hyperlipidemia Mother   . Diabetes Mother   . Cancer Mother        breast cancer; 04/2011  . Colon polyps Mother   . Breast cancer Mother 83  . Hyperlipidemia Father   . Hypertension Father   . Colon polyps Father   . Hypertension Sister   . Hypertension Brother   . Cancer Maternal Uncle        terminal lung cancer  . Breast cancer Paternal Aunt        80's  . Breast cancer Paternal Grandmother   . Bladder Cancer Neg Hx   . Kidney cancer Neg Hx     Social History   Socioeconomic History  . Marital status: Single    Spouse name: Not on file  . Number of children: Not on file  . Years of education: Not on file  . Highest education level: Not on file  Occupational History    Comment: Full Time  Social Needs  . Financial resource strain: Not on file  . Food insecurity:    Worry: Not on file    Inability: Not on file  . Transportation needs:    Medical: Not on file    Non-medical: Not on file  Tobacco Use  . Smoking status: Former Smoker    Packs/day: 0.00    Years: 8.00    Pack years: 0.00    Types: Cigarettes    Last attempt to quit: 06/04/1999    Years since quitting: 18.6  . Smokeless tobacco: Never Used  Substance and Sexual Activity  . Alcohol use: No    Comment: 1 drink every few months  . Drug use: No  . Sexual activity: Not on file  Lifestyle  . Physical activity:    Days per week: Not on file    Minutes per session: Not on file  .  Stress: Not on file  Relationships  . Social connections:    Talks on phone: Not on file    Gets together: Not on file    Attends religious service: Not on file    Active member of club or organization: Not on file    Attends meetings of clubs or organizations: Not on file    Relationship status: Not on file  . Intimate partner violence:    Fear of current or ex partner: Not on file  Emotionally abused: Not on file    Physically abused: Not on file    Forced sexual activity: Not on file  Other Topics Concern  . Not on file  Social History Narrative   No regular exercise          Review of Systems: See HPI, otherwise negative ROS  Physical Exam: BP (!) 137/96   Pulse (!) 58   Temp (!) 96.7 F (35.9 C) (Tympanic)   Resp 16   Ht 5\' 9"  (1.753 m)   Wt 88 kg   LMP 12/14/2014 (Exact Date)   SpO2 100%   BMI 28.65 kg/m  General:   Alert,  pleasant and cooperative in NAD Head:  Normocephalic and atraumatic. Neck:  Supple; no masses or thyromegaly. Lungs:  Clear throughout to auscultation.    Heart:  Regular rate and rhythm. Abdomen:  Soft, nontender and nondistended. Normal bowel sounds, without guarding, and without rebound.   Neurologic:  Alert and  oriented x4;  grossly normal neurologically.  Impression/Plan: TRUDEE CHIRINO is here for an endoscopy to be performed for vomiting, h/o GJ stricture  Risks, benefits, limitations, and alternatives regarding  endoscopy have been reviewed with the patient.  Questions have been answered.  All parties agreeable.   Sherri Sear, MD  01/08/2018, 1:25 PM

## 2018-01-08 NOTE — Transfer of Care (Signed)
Immediate Anesthesia Transfer of Care Note  Patient: Misty Zamora  Procedure(s) Performed: ESOPHAGOGASTRODUODENOSCOPY (EGD) WITH PROPOFOL (N/A )  Patient Location: Endoscopy Unit  Anesthesia Type:General  Level of Consciousness: awake and alert   Airway & Oxygen Therapy: Patient Spontanous Breathing and Patient connected to nasal cannula oxygen  Post-op Assessment: Report given to RN and Post -op Vital signs reviewed and stable  Post vital signs: Reviewed and stable  Last Vitals:  Vitals Value Taken Time  BP 115/72 01/08/2018  1:24 PM  Temp    Pulse 68 01/08/2018  1:24 PM  Resp 13 01/08/2018  1:24 PM  SpO2 100 % 01/08/2018  1:24 PM  Vitals shown include unvalidated device data.  Last Pain:  Vitals:   01/08/18 1209  TempSrc: Tympanic  PainSc: 0-No pain         Complications: No apparent anesthesia complications

## 2018-01-08 NOTE — Op Note (Signed)
[facility]astro[facility] Patient Name: Misty Zamora Procedure Date: 01/08/2018 12:20 PM MRN: [id number] Account #: [id number] Date of Birth: 18-Apr-1976 Admit Type: Outpatient Age: 42 Room: [facility] ENDO ROOM 4 Gender: Female Note Status: Finalized Procedure:            Upper GI endoscopy Indications:          Vomiting, , h/o Roux-en-Y bypass, GJ stricture,                        previous dilation Providers:            Lin Landsman MD, MD Referring MD:         Rose Fillers. Jaynie Crumble, MD (Referring MD) Medicines:            Monitored Anesthesia Care Complications:        No immediate complications. Estimated blood loss:                        Minimal. Procedure:            Pre-Anesthesia Assessment:                       - Prior to the procedure, a History and Physical was                        performed, and patient medications and allergies were                        reviewed. The patient is competent. The risks and                        benefits of the procedure and the sedation options and                        risks were discussed with the patient. All questions                        were answered and informed consent was obtained.                        Patient identification and proposed procedure were                        verified by the physician, the nurse, the                        anesthesiologist, the anesthetist and the technician in                        the pre-procedure area in the procedure room in the                        endoscopy suite. Mental Status Examination: alert and                        oriented. Airway Examination: normal oropharyngeal                        airway and neck mobility. Respiratory Examination:  clear to auscultation. CV Examination: normal.                        Prophylactic Antibiotics: The patient does not require                        prophylactic antibiotics. Prior Anticoagulants:  The                        patient has taken no previous anticoagulant or                        antiplatelet agents. ASA Grade Assessment: II - A                        patient with mild systemic disease. After reviewing the                        risks and benefits, the patient was deemed in                        satisfactory condition to undergo the procedure. The                        anesthesia plan was to use monitored anesthesia care                        (MAC). Immediately prior to administration of                        medications, the patient was re-assessed for adequacy                        to receive sedatives. The heart rate, respiratory rate,                        oxygen saturations, blood pressure, adequacy of                        pulmonary ventilation, and response to care were                        monitored throughout the procedure. The physical status                        of the patient was re-assessed after the procedure.                       After obtaining informed consent, the endoscope was                        passed under direct vision. Throughout the procedure,                        the patient's blood pressure, pulse, and oxygen                        saturations were monitored continuously. The Endoscope  was introduced through the mouth, and advanced to the                        jejunum. The upper GI endoscopy was performed with                        moderate difficulty due to the patient's agitation.                        Successful completion of the procedure was aided by                        increasing the dose of sedation medication. The patient                        tolerated the procedure well. Findings:      Evidence of a Roux-en-Y gastrojejunostomy was found. The gastrojejunal       anastomosis was characterized by mild stenosis. This was traversed. The       pouch-to-jejunum limb was characterized by healthy  appearing mucosa but       small gastric pouch. The duodenum-to-jejunum limb was not examined as it       could not be reached. A TTS dilator was passed through the scope.       Dilation with an 18-19-20 mm anastomotic balloon dilator was performed       to 48m. The dilation site was examined following endoscope reinsertion       and showed moderate improvement in luminal narrowing. Estimated blood       loss was minimal.      A small hiatal hernia was present.      The gastroesophageal junction and examined esophagus were normal.      The examined jejunum was normal. Biopsies for histology were taken with       a cold forceps for evaluation of celiac disease. Impression:           - Roux-en-Y gastrojejunostomy with gastrojejunal                        anastomosis characterized by mild stenosis. Dilated.                       - Small hiatal hernia.                       - Normal gastroesophageal junction and esophagus.                       - No specimens collected. Recommendation:       - Discharge patient to home (with escort).                       - Mechanical soft diet.                       - Continue present medications.                       - Repeat upper endoscopy in 4 weeks for retreatment.                       - Await pathology results. Procedure  Code(s):    --- Professional ---                       [phone], Esophagogastroduodenoscopy, flexible, transoral;                        with dilation of gastric/duodenal stricture(s) (eg,                        balloon, bougie)                       43239, Esophagogastroduodenoscopy, flexible, transoral;                        with biopsy, single or multiple Diagnosis Code(s):    --- Professional ---                       Z98.0, Intestinal bypass and anastomosis status                       R11.10, Vomiting, unspecified                       K44.9, Diaphragmatic hernia without obstruction or                        gangrene CPT  copyright 2017 American Medical Association. All rights reserved. The codes documented in this report are preliminary and upon coder review may  be revised to meet current compliance requirements. Dr. Ulyess Mort Lin Landsman MD, MD 01/08/2018 1:22:03 PM This report has been signed electronically. Number of Addenda: 0 Note Initiated On: 01/08/2018 12:20 PM      [facility]

## 2018-01-08 NOTE — Anesthesia Post-op Follow-up Note (Signed)
Anesthesia QCDR form completed.        

## 2018-01-08 NOTE — Anesthesia Preprocedure Evaluation (Signed)
Anesthesia Evaluation    History of Anesthesia Complications (+) PONV and history of anesthetic complications  Airway Mallampati: I       Dental  (+) Teeth Intact, Missing, Dental Advidsory Given   Pulmonary neg shortness of breath, asthma , sleep apnea , neg COPD, neg recent URI, former smoker,           Cardiovascular hypertension (none since gastric bypass), (-) angina(-) CAD, (-) Past MI, (-) Cardiac Stents and (-) CABG (-) dysrhythmias + Valvular Problems/Murmurs      Neuro/Psych PSYCHIATRIC DISORDERS Anxiety Depression negative neurological ROS     GI/Hepatic Neg liver ROS, GERD  ,  Endo/Other  negative endocrine ROS  Renal/GU negative Renal ROS     Musculoskeletal   Abdominal   Peds  Hematology  (+) anemia ,   Anesthesia Other Findings Past Medical History: No date: Anemia     Comment:  had iron infusions. last one in Sept. told levels are               now normal No date: Anxiety No date: Bulge of cervical disc without myelopathy No date: Bulging lumbar disc     Comment:  causes bilateral leg pain No date: Depression No date: Family history of adverse reaction to anesthesia     Comment:  Father - PONV No date: GERD (gastroesophageal reflux disease)     Comment:  occasional 2005: Heart murmur 2005: History of cardiovascular stress test     Comment:  showed MR and TR No date: History of kidney stones No date: Hypertension     Comment:  no meds since gastric bypass No date: IDA (iron deficiency anemia) No date: PONV (postoperative nausea and vomiting)     Comment:  hypotension with epidural No date: Sleep apnea     Comment:  was better after gastric bypass, hasd recently started               snoring again. No date: Wears contact lenses   Reproductive/Obstetrics                             Anesthesia Physical  Anesthesia Plan  ASA: III  Anesthesia Plan: General    Post-op Pain Management:    Induction: Intravenous  PONV Risk Score and Plan: 4 or greater and Propofol infusion and TIVA  Airway Management Planned: Nasal Cannula  Additional Equipment:   Intra-op Plan:   Post-operative Plan:   Informed Consent: I have reviewed the patients History and Physical, chart, labs and discussed the procedure including the risks, benefits and alternatives for the proposed anesthesia with the patient or authorized representative who has indicated his/her understanding and acceptance.     Plan Discussed with:   Anesthesia Plan Comments:         Anesthesia Quick Evaluation

## 2018-01-09 ENCOUNTER — Other Ambulatory Visit: Payer: Self-pay | Admitting: Family Medicine

## 2018-01-09 DIAGNOSIS — M5136 Other intervertebral disc degeneration, lumbar region: Secondary | ICD-10-CM

## 2018-01-09 DIAGNOSIS — M5116 Intervertebral disc disorders with radiculopathy, lumbar region: Secondary | ICD-10-CM

## 2018-01-10 LAB — SURGICAL PATHOLOGY

## 2018-01-15 ENCOUNTER — Ambulatory Visit: Payer: 59

## 2018-01-15 MED ORDER — ENALAPRILAT 1.25 MG/ML IV INJ
1.25 | INJECTION | INTRAVENOUS | Status: DC
Start: ? — End: 2018-01-15

## 2018-01-15 MED ORDER — ACETAMINOPHEN 325 MG PO TABS
650.00 | ORAL_TABLET | ORAL | Status: DC
Start: ? — End: 2018-01-15

## 2018-01-15 MED ORDER — CLONAZEPAM 0.5 MG PO TABS
.50 | ORAL_TABLET | ORAL | Status: DC
Start: ? — End: 2018-01-15

## 2018-01-15 MED ORDER — HYDRALAZINE HCL 20 MG/ML IJ SOLN
5.00 | INTRAMUSCULAR | Status: DC
Start: ? — End: 2018-01-15

## 2018-01-15 MED ORDER — MORPHINE SULFATE 2 MG/ML IJ SOLN
2.00 | INTRAMUSCULAR | Status: DC
Start: ? — End: 2018-01-15

## 2018-01-15 MED ORDER — DIPHENHYDRAMINE HCL 50 MG/ML IJ SOLN
12.50 | INTRAMUSCULAR | Status: DC
Start: ? — End: 2018-01-15

## 2018-01-15 MED ORDER — OXYCODONE HCL 5 MG/5ML PO SOLN
10.00 | ORAL | Status: DC
Start: ? — End: 2018-01-15

## 2018-01-15 MED ORDER — HYDROXYZINE HCL 25 MG PO TABS
25.00 | ORAL_TABLET | ORAL | Status: DC
Start: ? — End: 2018-01-15

## 2018-01-15 MED ORDER — ONDANSETRON HCL 4 MG/2ML IJ SOLN
4.00 | INTRAMUSCULAR | Status: DC
Start: ? — End: 2018-01-15

## 2018-01-15 MED ORDER — SODIUM CHLORIDE 0.9 % IV SOLN
125.00 | INTRAVENOUS | Status: DC
Start: ? — End: 2018-01-15

## 2018-01-15 MED ORDER — PAROXETINE HCL 20 MG PO TABS
40.00 | ORAL_TABLET | ORAL | Status: DC
Start: 2018-01-17 — End: 2018-01-15

## 2018-01-15 MED ORDER — GENERIC EXTERNAL MEDICATION
40.00 | Status: DC
Start: ? — End: 2018-01-15

## 2018-01-15 MED ORDER — GENERIC EXTERNAL MEDICATION
12.50 | Status: DC
Start: ? — End: 2018-01-15

## 2018-01-15 MED ORDER — SCOPOLAMINE 1 MG/3DAYS TD PT72
1.00 | MEDICATED_PATCH | TRANSDERMAL | Status: DC
Start: 2018-01-18 — End: 2018-01-15

## 2018-01-16 ENCOUNTER — Ambulatory Visit: Admission: RE | Admit: 2018-01-16 | Payer: 59 | Source: Ambulatory Visit

## 2018-01-16 MED ORDER — MORPHINE SULFATE 2 MG/ML IJ SOLN
2.00 | INTRAMUSCULAR | Status: DC
Start: ? — End: 2018-01-16

## 2018-01-16 MED ORDER — BISACODYL 10 MG RE SUPP
10.00 | RECTAL | Status: DC
Start: 2018-01-16 — End: 2018-01-16

## 2018-01-16 MED ORDER — OXYCODONE HCL 5 MG/5ML PO SOLN
5.00 | ORAL | Status: DC
Start: ? — End: 2018-01-16

## 2018-01-17 ENCOUNTER — Ambulatory Visit: Payer: 59

## 2018-01-18 ENCOUNTER — Other Ambulatory Visit: Payer: Self-pay | Admitting: Family Medicine

## 2018-01-18 DIAGNOSIS — M5116 Intervertebral disc disorders with radiculopathy, lumbar region: Secondary | ICD-10-CM

## 2018-01-18 DIAGNOSIS — M5136 Other intervertebral disc degeneration, lumbar region: Secondary | ICD-10-CM

## 2018-01-19 ENCOUNTER — Telehealth: Payer: Self-pay

## 2018-01-19 ENCOUNTER — Other Ambulatory Visit: Payer: Self-pay | Admitting: Family Medicine

## 2018-01-19 ENCOUNTER — Telehealth: Payer: Self-pay | Admitting: Family Medicine

## 2018-01-19 DIAGNOSIS — M5136 Other intervertebral disc degeneration, lumbar region: Secondary | ICD-10-CM

## 2018-01-19 DIAGNOSIS — M51369 Other intervertebral disc degeneration, lumbar region without mention of lumbar back pain or lower extremity pain: Secondary | ICD-10-CM

## 2018-01-19 DIAGNOSIS — M5116 Intervertebral disc disorders with radiculopathy, lumbar region: Secondary | ICD-10-CM

## 2018-01-19 MED ORDER — OXYCODONE HCL 10 MG PO TABS: ORAL_TABLET | ORAL | 0 refills | Status: DC

## 2018-01-19 NOTE — Telephone Encounter (Signed)
Patient has had a recent admission at [facility] for evaluation of abdominal pain. No significant findings. Increase in back pains and abdominal pains coincided with a law suit over her former job with [employer]. She requested her last rx for oxycodone 10 days early after I had tapered her to 5 pills daily. States she is no longer on bupropion. I have told her that I was concerned she was addicted to this pain medication and lot of her pain may be augmented by stress/anxiety. She does not feel that she has significant anxiety but is agreeable to tapering to 40 mg of oxycodone daily. She will be seeing pain management, Dr. Holley Raring, later this week.

## 2018-01-19 NOTE — Telephone Encounter (Signed)
[facility] called needing a PA for pt's oxycodone 10 mg  Their call back is [phone]  Thanks teri

## 2018-01-19 NOTE — Telephone Encounter (Signed)
Pre-Authorization sent electronically through [employer] ( Key# [id number]) status-pending. KW

## 2018-01-20 ENCOUNTER — Ambulatory Visit: Payer: 59

## 2018-01-20 ENCOUNTER — Ambulatory Visit
Admission: RE | Admit: 2018-01-20 | Discharge: 2018-01-20 | Disposition: A | Discharge status: Home / Self Care | Payer: 59 | Source: Ambulatory Visit | Attending: Student | Admitting: Student

## 2018-01-20 DIAGNOSIS — M48061 Spinal stenosis, lumbar region without neurogenic claudication: Secondary | ICD-10-CM | POA: Insufficient documentation

## 2018-01-20 DIAGNOSIS — M47816 Spondylosis without myelopathy or radiculopathy, lumbar region: Secondary | ICD-10-CM | POA: Diagnosis not present

## 2018-01-20 DIAGNOSIS — G8929 Other chronic pain: Secondary | ICD-10-CM | POA: Insufficient documentation

## 2018-01-20 DIAGNOSIS — M5442 Lumbago with sciatica, left side: Secondary | ICD-10-CM | POA: Insufficient documentation

## 2018-01-21 ENCOUNTER — Encounter: Payer: Self-pay | Admitting: Student in an Organized Health Care Education/Training Program

## 2018-01-21 ENCOUNTER — Ambulatory Visit
Payer: 59 | Attending: Student in an Organized Health Care Education/Training Program | Admitting: Student in an Organized Health Care Education/Training Program

## 2018-01-21 VITALS — BP 122/59 | HR 86 | Temp 98.0°F | Resp 16 | Ht 69.0 in | Wt 196.0 lb

## 2018-01-21 DIAGNOSIS — R102 Pelvic and perineal pain: Secondary | ICD-10-CM | POA: Diagnosis not present

## 2018-01-21 DIAGNOSIS — M5136 Other intervertebral disc degeneration, lumbar region: Secondary | ICD-10-CM

## 2018-01-21 DIAGNOSIS — M5116 Intervertebral disc disorders with radiculopathy, lumbar region: Secondary | ICD-10-CM

## 2018-01-21 DIAGNOSIS — E042 Nontoxic multinodular goiter: Secondary | ICD-10-CM | POA: Diagnosis not present

## 2018-01-21 DIAGNOSIS — R1013 Epigastric pain: Secondary | ICD-10-CM | POA: Insufficient documentation

## 2018-01-21 DIAGNOSIS — M5080 Other cervical disc disorders, unspecified cervical region: Secondary | ICD-10-CM | POA: Diagnosis present

## 2018-01-21 DIAGNOSIS — F419 Anxiety disorder, unspecified: Secondary | ICD-10-CM | POA: Diagnosis not present

## 2018-01-21 DIAGNOSIS — D509 Iron deficiency anemia, unspecified: Secondary | ICD-10-CM | POA: Insufficient documentation

## 2018-01-21 DIAGNOSIS — M5126 Other intervertebral disc displacement, lumbar region: Secondary | ICD-10-CM | POA: Diagnosis not present

## 2018-01-21 DIAGNOSIS — M25541 Pain in joints of right hand: Secondary | ICD-10-CM | POA: Diagnosis not present

## 2018-01-21 DIAGNOSIS — K219 Gastro-esophageal reflux disease without esophagitis: Secondary | ICD-10-CM | POA: Diagnosis not present

## 2018-01-21 DIAGNOSIS — M542 Cervicalgia: Secondary | ICD-10-CM | POA: Insufficient documentation

## 2018-01-21 DIAGNOSIS — Z833 Family history of diabetes mellitus: Secondary | ICD-10-CM | POA: Diagnosis not present

## 2018-01-21 DIAGNOSIS — Z9884 Bariatric surgery status: Secondary | ICD-10-CM | POA: Diagnosis not present

## 2018-01-21 DIAGNOSIS — M5416 Radiculopathy, lumbar region: Secondary | ICD-10-CM | POA: Diagnosis not present

## 2018-01-21 DIAGNOSIS — Z79899 Other long term (current) drug therapy: Secondary | ICD-10-CM | POA: Insufficient documentation

## 2018-01-21 DIAGNOSIS — M25542 Pain in joints of left hand: Secondary | ICD-10-CM | POA: Diagnosis not present

## 2018-01-21 DIAGNOSIS — M545 Low back pain: Secondary | ICD-10-CM | POA: Diagnosis present

## 2018-01-21 NOTE — Telephone Encounter (Signed)
Pre-authorization was approved. KW

## 2018-01-21 NOTE — Progress Notes (Signed)
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 Bulging lumbar disc   .  Depression   . Family history of adverse reaction to anesthesia   . GERD (gastroesophageal reflux disease)   . Heart murmur 2005  . History of cardiovascular stress test 2005  . History of kidney stones   . Hypertension   . PONV (postoperative nausea and vomiting)   . Sleep apnea   . Wears contact lenses    Constitutional Exam  General appearance: Well nourished, well developed, and well hydrated. In no apparent acute distress Vitals:   01/21/18 1330  BP: (!) 122/59  Pulse: 86  Resp: 16  Temp: 98 F (36.7 C)  SpO2: 100%  Weight: 196 lb (88.9 kg)  Height: 5' 9" (1.753 m)   BMI Assessment: Estimated body mass index is 28.94 kg/m as calculated from the following:   Height as of this  encounter: 5' 9" (1.753 m).   Weight as of this encounter: 196 lb (88.9 kg).  BMI interpretation table: BMI level Category Range association with higher incidence of chronic pain  <18 kg/m2 Underweight   18.5-24.9 kg/m2 Ideal body weight   25-29.9 kg/m2 Overweight Increased incidence by 20%  30-34.9 kg/m2 Obese (Class I) Increased incidence by 68%  35-39.9 kg/m2 Severe obesity (Class II) Increased incidence by 136%  >40 kg/m2 Extreme obesity (Class III) Increased incidence by 254%   Patient's current BMI Ideal Body weight  Body mass index is 28.94 kg/m. Ideal body weight: 66.2 kg (145 lb 15.1 oz) Adjusted ideal body weight: 75.3 kg (165 lb 15.5 oz)   BMI Readings from Last 4 Encounters:  01/21/18 28.94 kg/m  01/08/18 28.65 kg/m  01/07/18 28.94 kg/m  12/01/17 27.91 kg/m   Wt Readings from Last 4 Encounters:  01/21/18 196 lb (88.9 kg)  01/08/18 194 lb 0.1 oz (88 kg)  01/07/18 196 lb (88.9 kg)  12/01/17 189 lb (85.7 kg)  Psych/Mental status: Alert, oriented x 3 (person, place, & time)       Eyes: PERLA Respiratory: No evidence of acute respiratory distress  Cervical Spine Area Exam  Skin & Axial Inspection: No masses, redness, edema, swelling, or associated skin lesions Alignment: Symmetrical Functional ROM: Unrestricted ROM      Stability: No instability detected Muscle Tone/Strength: Functionally intact. No obvious neuro-muscular anomalies detected. Sensory (Neurological): Unimpaired Palpation: No palpable anomalies              Upper Extremity (UE) Exam    Side: Right upper extremity  Side: Left upper extremity  Skin & Extremity Inspection: Skin color, temperature, and hair growth are WNL. No peripheral edema or cyanosis. No masses, redness, swelling, asymmetry, or associated skin lesions. No contractures.  Skin & Extremity Inspection: Skin color, temperature, and hair growth are WNL. No peripheral edema or cyanosis. No masses, redness, swelling, asymmetry, or  associated skin lesions. No contractures.  Functional ROM: Unrestricted ROM          Functional ROM: Unrestricted ROM          Muscle Tone/Strength: Functionally intact. No obvious neuro-muscular anomalies detected.  Muscle Tone/Strength: Functionally intact. No obvious neuro-muscular anomalies detected.  Sensory (Neurological): Unimpaired          Sensory (Neurological): Unimpaired          Palpation: No palpable anomalies              Palpation: No palpable anomalies              Provocative Test(s):  Phalen's test: deferred Tinel's test: deferred Apley's scratch test (touch opposite shoulder):  Action 1 (Across chest): deferred Action 2 (Overhead): deferred Action 3 (LB reach): deferred   Provocative Test(s):  Phalen's test: deferred Tinel's test: deferred Apley's scratch test (touch opposite shoulder):  Action 1 (Across chest): deferred Action 2 (Overhead): deferred Action 3 (LB reach): deferred    Thoracic Spine Area Exam  Skin & Axial Inspection: No masses, redness, or swelling Alignment: Symmetrical Functional ROM: Unrestricted ROM Stability: No instability detected Muscle Tone/Strength: Functionally intact. No obvious neuro-muscular anomalies detected. Sensory (Neurological): Unimpaired Muscle strength & Tone: No palpable anomalies  Lumbar Spine Area Exam  Skin & Axial Inspection: No masses, redness, or swelling Alignment: Symmetrical Functional ROM: Decreased ROM affecting both sides left greater than right Stability: No instability detected Muscle Tone/Strength: Functionally intact. No obvious neuro-muscular anomalies detected. Sensory (Neurological): Dermatomal pain pattern left L5-S1 distribution Palpation: No palpable anomalies       Provocative Tests: Hyperextension/rotation test: deferred today       Lumbar quadrant test (Kemp's test): (+) on the left for foraminal stenosis Lateral bending test: (+) ipsilateral radicular pain, on the left. Positive for  left-sided foraminal stenosis. Patrick's Maneuver: deferred today                   FABER test: deferred today                   S-I anterior distraction/compression test: deferred today         S-I lateral compression test: deferred today         S-I Thigh-thrust test: deferred today         S-I Gaenslen's test: deferred today          Gait & Posture Assessment  Ambulation: Unassisted Gait: Relatively normal for age and body habitus Posture: WNL   Lower Extremity Exam    Side: Right lower extremity  Side: Left lower extremity  Stability: No instability observed          Stability: No instability observed          Skin & Extremity Inspection: Skin color, temperature, and hair growth are WNL. No peripheral edema or cyanosis. No masses, redness, swelling, asymmetry, or associated skin lesions. No contractures.  Skin & Extremity Inspection: Skin color, temperature, and hair growth are WNL. No peripheral edema or cyanosis. No masses, redness, swelling, asymmetry, or associated skin lesions. No contractures.  Functional ROM: Unrestricted ROM                  Functional ROM: Unrestricted ROM                  Muscle Tone/Strength: Functionally intact. No obvious neuro-muscular anomalies detected.  Muscle Tone/Strength: Functionally intact. No obvious neuro-muscular anomalies detected.  Sensory (Neurological): Unimpaired  Sensory (Neurological): Unimpaired  Palpation: No palpable anomalies  Palpation: No palpable anomalies   Assessment  Primary Diagnosis & Pertinent Problem List: The primary encounter diagnosis was Lumbar radiculopathy (L>R) L5, S1. Diagnoses of Neuritis or radiculitis due to rupture of lumbar intervertebral disc, Lumbar herniated disc, History of Roux-en-Y gastric bypass, Bariatric surgery status, DDD (degenerative disc disease), lumbar, and Anxiety were also pertinent to this visit.  Visit Diagnosis (New problems to examiner): 1. Lumbar radiculopathy (L>R) L5, S1   2.  Neuritis or radiculitis due to rupture of lumbar intervertebral disc   3. Lumbar herniated disc   4. History of Roux-en-Y gastric bypass   5. Bariatric surgery status   6. DDD (degenerative disc disease), lumbar  7. Anxiety    42 year old female with a chief complaint of axial low back pain that radiates down her left leg in a dermatomal fashion following the L5 and S1 dermatome.  Patient is referred from Dr. Cari Caraway for consideration of spinal cord stimulator trial.  She has failed conservative management for the last 2 years including multiple epidural steroid injections (greater than 6 ), multiple medication trials with NSAIDs, gabapentin, Lyrica which have not been very effective for her pain.  NSAIDs are contraindicated given her history of gastric bypass.  Surgical intervention is not recommended per surgical evaluation.  Of note the majority of her pain is on her left leg down her left calf to her left foot.  She has an L5-S1 disc herniation.  Physical therapy was not very effective.  This is affecting her quality of life.  I had an extensive discussion with the patient about the potential risks and benefits of spinal cord stimulator trial. Patient's most recent MRI on 01/20/2018 shows moderate right L4-L5 neuroforaminal narrowing and mild left neuroforaminal narrowing.  Patient has more left-sided symptoms which is interesting.  Patient's symptoms sound radicular in nature affecting the L5-S1 dermatome.  I will send the patient for psych evaluation which is necessary prior to spinal cord stimulator trial.  Patient will return in approximately 3 to 4 weeks after psych eval to review any further questions or concerns that she may have about spinal cord stimulator trial.  At that point if the patient would like to proceed, we will get her scheduled for SCS trial.  Medication management to continue with PCP.  Ordered Lab-work, Procedure(s), Referral(s), & Consult(s): Orders Placed This Encounter   Procedures  . Ambulatory referral to Psychology    Interventional management options: Ms. Tunison was informed that there is no guarantee that she would be a candidate for interventional therapies. The decision will be based on the results of diagnostic studies, as well as Ms. Aldaz risk profile.  Procedure(s) under consideration:  SCS TRIAL   Provider-requested follow-up: Return in about 3 weeks (around 02/11/2018) for After Psychological evaluation.  Future Appointments  Date Time Provider [place]  02/06/2018  1:30 PM Sindy Guadeloupe, MD [facility] None    Primary Care Physician: Carmon Ginsberg, PA Location: [facility] Note by: Gillis Santa, M.D, Date: 01/21/2018; Time: 8:18 AM  There are no Patient Instructions on file for this visit.

## 2018-01-21 NOTE — Progress Notes (Signed)
Safety precautions to be maintained throughout the outpatient stay will include: orient to surroundings, keep bed in low position, maintain call bell within reach at all times, provide assistance with transfer out of bed and ambulation.  

## 2018-01-21 NOTE — Progress Notes (Deleted)
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 Department of public safety, offender search: Editor, commissioning Information) Non-contributory Risk Assessment Profile: Aberrant behavior: None observed or detected today Risk factors for fatal opioid overdose: None identified today PMP NARX Overdose Risk Score: *** Fatal overdose hazard ratio (HR): Calculation deferred Non-fatal overdose hazard ratio (HR): Calculation deferred Risk of opioid abuse or dependence: 0.7-3.0% with doses ? 36 MME/day and 6.1-26% with doses ? 120 MME/day. Substance use disorder (SUD) risk level: See below Personal History of Substance Abuse (SUD-Substance use disorder):  Alcohol: Negative  Illegal Drugs: Negative  Rx Drugs: Negative  ORT Risk Level calculation: Low Risk Opioid Risk Tool - 01/21/18 1348      Family History of Substance Abuse   Alcohol  Negative    Illegal Drugs  Negative    Rx Drugs  Negative      Personal History of Substance Abuse   Alcohol  Negative    Illegal Drugs  Negative    Rx Drugs  Negative  Psychological Disease   Psychological Disease  Positive    ADD  Negative    OCD  Negative    Bipolar  Negative    Schizophrenia  Negative    Depression  Positive   anxiety     Total Score   Opioid Risk Tool Scoring  3    Opioid Risk Interpretation  Low Risk      ORT Scoring interpretation table:  Score <3 = Low Risk for SUD  Score between 4-7 = Moderate Risk for SUD  Score >8 = High Risk for Opioid Abuse   PHQ-2 Depression Scale:  Total score: 0  PHQ-2 Scoring interpretation table: (Score and probability of major depressive disorder)  Score 0 = No depression  Score 1 = 15.4% Probability  Score 2 = 21.1% Probability  Score 3 = 38.4%  Probability  Score 4 = 45.5% Probability  Score 5 = 56.4% Probability  Score 6 = 78.6% Probability   PHQ-9 Depression Scale:  Total score: 0  PHQ-9 Scoring interpretation table:  Score 0-4 = No depression  Score 5-9 = Mild depression  Score 10-14 = Moderate depression  Score 15-19 = Moderately severe depression  Score 20-27 = Severe depression (2.4 times higher risk of SUD and 2.89 times higher risk of overuse)   Pharmacologic Plan: As per protocol, I have not taken over any controlled substance management, pending the results of ordered tests and/or consults.            Initial impression: Pending review of available data and ordered tests.  Meds   Current Outpatient Medications:  .  albuterol (PROVENTIL HFA;VENTOLIN HFA) 108 (90 Base) MCG/ACT inhaler, Inhale 2 puffs into the lungs every 6 (six) hours as needed for wheezing or shortness of breath., Disp: 1 Inhaler, Rfl: 0 .  clonazePAM (KLONOPIN) 0.5 MG tablet, TAKE 1 TABLET BY MOUTH TWICE A DAY AS NEEDED FOR ANXIETY, Disp: 60 tablet, Rfl: 5 .  hydrOXYzine (ATARAX/VISTARIL) 25 MG tablet, One or two every 6 hours as needed for anxiety or sleep, Disp: 60 tablet, Rfl: 2 .  Multiple Vitamins-Minerals (BARIATRIC MULTIVITAMINS/IRON PO), Take by mouth daily., Disp: , Rfl:  .  Oxycodone HCl 10 MG TABS, One pill every 4 hours as needed for pain. Not to exceed 4 pills in a 24 hour period., Disp: 120 tablet, Rfl: 0 .  Vitamin D, Ergocalciferol, (DRISDOL) 50000 units CAPS capsule, TAKE ONE CAPSULE BY MOUTH ONE TIME PER WEEK, Disp: , Rfl: 4 .  cefdinir (OMNICEF) 300 MG capsule, TAKE 1 CAPSULE BY MOUTH TWICE A DAY FOR 2 WEEKS, Disp: , Rfl: 0  Imaging Review  Cervical Imaging: Cervical MR wo contrast: No results found for this or any previous visit. Cervical MR wo contrast: No procedure found. Cervical MR w/wo contrast: No results found for this or any previous visit. Cervical MR w contrast: No results found for this or any previous  visit. Cervical CT wo contrast: No results found for this or any previous visit. Cervical CT w/wo contrast: No results found for this or any previous visit. Cervical CT w/wo contrast: No results found for this or any previous visit. Cervical CT w contrast: No results found for this or any previous visit. Cervical CT outside: No results found for this or any previous visit. Cervical DG 1 view: No results found for this or any previous visit. Cervical DG 2-3 views: No results found for this or any previous visit. Cervical DG F/E views: No results found for this or  any previous visit. Cervical DG 2-3 clearing views: No results found for this or any previous visit. Cervical DG Bending/F/E views: No results found for this or any previous visit. Cervical DG complete: No results found for this or any previous visit. Cervical DG Myelogram views: No results found for this or any previous visit. Cervical DG Myelogram views: No results found for this or any previous visit. Cervical Discogram views: No results found for this or any previous visit.  Shoulder Imaging: Shoulder-R MR w contrast: No results found for this or any previous visit. Shoulder-L MR w contrast: No results found for this or any previous visit. Shoulder-R MR w/wo contrast: No results found for this or any previous visit. Shoulder-L MR w/wo contrast: No results found for this or any previous visit. Shoulder-R MR wo contrast: No results found for this or any previous visit. Shoulder-L MR wo contrast: No results found for this or any previous visit. Shoulder-R CT w contrast: No results found for this or any previous visit. Shoulder-L CT w contrast: No results found for this or any previous visit. Shoulder-R CT w/wo contrast: No results found for this or any previous visit. Shoulder-L CT w/wo contrast: No results found for this or any previous visit. Shoulder-R CT wo contrast: No results found for this or any previous visit. Shoulder-L  CT wo contrast: No results found for this or any previous visit. Shoulder-R DG Arthrogram: No results found for this or any previous visit. Shoulder-L DG Arthrogram: No results found for this or any previous visit. Shoulder-R DG 1 view: No results found for this or any previous visit. Shoulder-L DG 1 view: No results found for this or any previous visit. Shoulder-R DG: No results found for this or any previous visit. Shoulder-L DG: No results found for this or any previous visit.  Thoracic Imaging: Thoracic MR wo contrast: No results found for this or any previous visit. Thoracic MR wo contrast: No procedure found. Thoracic MR w/wo contrast: No results found for this or any previous visit. Thoracic MR w contrast: No results found for this or any previous visit. Thoracic CT wo contrast: No results found for this or any previous visit. Thoracic CT w/wo contrast: No results found for this or any previous visit. Thoracic CT w/wo contrast: No results found for this or any previous visit. Thoracic CT w contrast: No results found for this or any previous visit. Thoracic DG 2-3 views: No results found for this or any previous visit. Thoracic DG 4 views: No results found for this or any previous visit. Thoracic DG: No results found for this or any previous visit. Thoracic DG w/swimmers view: No results found for this or any previous visit. Thoracic DG Myelogram views: No results found for this or any previous visit. Thoracic DG Myelogram views: No results found for this or any previous visit.  Lumbosacral Imaging: Lumbar MR wo contrast:  Results for orders placed during the hospital encounter of 01/20/18  MR LUMBAR SPINE WO CONTRAST   Narrative CLINICAL DATA:  Low back pain radiating to RIGHT buttock and LEFT hip for 1 month.  EXAM: MRI LUMBAR SPINE WITHOUT CONTRAST  TECHNIQUE: Multiplanar, multisequence MR imaging of the lumbar spine was performed. No intravenous contrast was  administered.  COMPARISON:  MRI lumbar spine July 03, 2016  FINDINGS: Moderately motion degraded examination.  SEGMENTATION: For the purposes of this report, the last well-formed intervertebral disc is reported as L5-S1.  ALIGNMENT: Maintained lumbar lordosis. No malalignment.  VERTEBRAE:Vertebral bodies are  intact. Mild L4-5, moderate L5-S1 disc height loss with mild disc desiccation is relatively unchanged. No acute or abnormal bone marrow signal.  CONUS MEDULLARIS AND CAUDA EQUINA: Conus medullaris terminates at L1 and demonstrates normal morphology and signal characteristics. Cauda equina is normal.  PARASPINAL AND OTHER SOFT TISSUES: Included prevertebral and paraspinal soft tissues are normal.  DISC LEVELS:  T12-L1 through L3-4: No disc bulge, canal stenosis nor neural foraminal narrowing.  L4-5: Small broad-based disc bulge asymmetric to the RIGHT. Mild facet arthropathy and ligamentum flavum redundancy. No canal stenosis. Mild-to-moderate RIGHT, mild LEFT neural foraminal narrowing.  L5-S1: Similar RIGHT central disc protrusion and annular fissure. Mild facet arthropathy. No canal stenosis or neural foraminal narrowing.  IMPRESSION: 1. Moderately motion degraded examination without fracture, malalignment or acute osseous process. 2. Similar degenerative change of the lower lumbar spine. No canal stenosis. Mild-to-moderate RIGHT L4-5 neural foraminal narrowing.   Electronically Signed   By: Elon Alas M.D.   On: 01/21/2018 03:57    Lumbar MR wo contrast: No procedure found. Lumbar MR w/wo contrast: No results found for this or any previous visit. Lumbar MR w/wo contrast: No results found for this or any previous visit. Lumbar MR w contrast: No results found for this or any previous visit. Lumbar CT wo contrast: No results found for this or any previous visit. Lumbar CT w/wo contrast: No results found for this or any previous visit. Lumbar CT  w/wo contrast: No results found for this or any previous visit. Lumbar CT w contrast:  Results for orders placed in visit on 10/09/15  CT Lumbar Spine W Contrast   Lumbar DG 1V: No results found for this or any previous visit. Lumbar DG 1V (Clearing): No results found for this or any previous visit. Lumbar DG 2-3V (Clearing): No results found for this or any previous visit. Lumbar DG 2-3 views: No results found for this or any previous visit. Lumbar DG (Complete) 4+V:  Results for orders placed during the hospital encounter of 02/18/15  DG Lumbar Spine Complete   Narrative CLINICAL DATA:  Low back and leg pain with radicular symptoms for a couple weeks.  EXAM: LUMBAR SPINE - COMPLETE 4+ VIEW  COMPARISON:  12/09/2014 CT reconstructions  FINDINGS: Slight curvature of the lumbar spine may be positional. Normal lumbar spine alignment. No acute fracture, compression deformity or focal kyphosis. Preserved vertebral body heights and disc spaces. No pars defects. Normal SI joints. Prior cholecystectomy noted. Normal bowel gas pattern.  IMPRESSION: No acute finding.   Electronically Signed   By: Jerilynn Mages.  Shick M.D.   On: 02/18/2015 12:23    Lumbar DG F/E views: No results found for this or any previous visit. Lumbar DG Bending views: No results found for this or any previous visit. Lumbar DG Myelogram views: No results found for this or any previous visit. Lumbar DG Myelogram: No results found for this or any previous visit. Lumbar DG Myelogram: No results found for this or any previous visit. Lumbar DG Myelogram: No results found for this or any previous visit. Lumbar DG Myelogram Lumbosacral:  Results for orders placed during the hospital encounter of 04/20/15  DG MYELOGRAPHY LUMBAR INJ LUMBOSACRAL   Narrative CLINICAL DATA:  Lumbosacral spondylosis without myelopathy. BILATERAL lower extremity symptoms.  EXAM: LUMBAR MYELOGRAM  FLUOROSCOPY TIME:  1 minutes and 4 seconds.  16  spot films.  PROCEDURE: After thorough discussion of risks and benefits of the procedure including bleeding, infection, injury to nerves, blood vessels, adjacent structures as  well as headache and CSF leak, written and oral informed consent was obtained. Consent was obtained by Dr. Rolla Flatten. Time out form was completed.  Patient was positioned prone on the fluoroscopy table. Local anesthesia was provided with 1% lidocaine without epinephrine after prepped and draped in the usual sterile fashion. Puncture was performed at L3-4 using a 3 1/2 inch 22-gauge spinal needle via midline approach. Using a single pass through the dura, the needle was placed within the thecal sac, with return of clear CSF. 15 mL of Omnipaque-180 was injected into the thecal sac, with normal opacification of the nerve roots and cauda equina consistent with free flow within the subarachnoid space.  I personally performed the lumbar puncture and administered the intrathecal contrast. I also personally supervised acquisition of the myelogram images.  TECHNIQUE: Contiguous axial images were obtained through the Lumbar spine after the intrathecal infusion of infusion. Coronal and sagittal reconstructions were obtained of the axial image sets.  COMPARISON:  MRI lumbar spine 02/22/2015.  FINDINGS: LUMBAR MYELOGRAM FINDINGS:  Good opacification lumbar subarachnoid space. Anatomic alignment. No nerve root effacement or truncation. No spinal stenosis. Disc space narrowing L5-S1.  With patient standing, there is no dynamic instability.  CT LUMBAR MYELOGRAM FINDINGS:  No prevertebral or paraspinous masses. Cholecystectomy. No visible hydronephrosis or renal calculi. No worrisome osseous lesion. Normal conus.  L1-L2:  Normal.  L2-L3:  Normal.  L3-L4:  Normal.  L4-L5:  Normal.  L5-S1: Central protrusion extends more to the RIGHT than the LEFT. No pars defects. Disc osteophyte complex of a chronic  nature. Mild subarticular zone narrowing does not clearly compress the exiting S1 nerve roots. Mild foraminal narrowing similarly is does not clearly affect the exiting L5 nerve roots.  Compared with recent MR, good general agreement.  IMPRESSION: LUMBAR MYELOGRAM IMPRESSION:  No nerve root cut off or spinal stenosis. No dynamic instability. Disc space narrowing L5-S1.  CT LUMBAR MYELOGRAM IMPRESSION:  Central and rightward protrusion L5-S1. Mild BILATERAL foraminal narrowing. No neural impingement is demonstrated.   Electronically Signed   By: Staci Righter M.D.   On: 04/20/2015 15:37    Lumbar DG Diskogram views: No results found for this or any previous visit. Lumbar DG Diskogram views: No results found for this or any previous visit. Lumbar DG Epidurogram OP: No results found for this or any previous visit. Lumbar DG Epidurogram IP: No results found for this or any previous visit.  Sacroiliac Joint Imaging: Sacroiliac Joint DG: No results found for this or any previous visit. Sacroiliac Joint MR w/wo contrast: No results found for this or any previous visit. Sacroiliac Joint MR wo contrast: No results found for this or any previous visit.  Spine Imaging: Whole Spine DG Myelogram views: No results found for this or any previous visit. Whole Spine MR Mets screen: No results found for this or any previous visit. Whole Spine MR Mets screen: No results found for this or any previous visit. Whole Spine MR w/wo: No results found for this or any previous visit. MRA Spinal Canal w/ cm: No results found for this or any previous visit. MRA Spinal Canal wo/ cm: No procedure found. MRA Spinal Canal w/wo cm: No results found for this or any previous visit. Spine Outside MR Films: No results found for this or any previous visit. Spine Outside CT Films: No results found for this or any previous visit. CT-Guided Biopsy: No results found for this or any previous visit. CT-Guided Needle  Placement: No results found for  this or any previous visit. DG Spine outside: No results found for this or any previous visit. IR Spine outside: No results found for this or any previous visit. NM Spine outside: No results found for this or any previous visit.  Hip Imaging: Hip-R MR w contrast: No results found for this or any previous visit. Hip-L MR w contrast: No results found for this or any previous visit. Hip-R MR w/wo contrast: No results found for this or any previous visit. Hip-L MR w/wo contrast: No results found for this or any previous visit. Hip-R MR wo contrast: No results found for this or any previous visit. Hip-L MR wo contrast: No results found for this or any previous visit. Hip-R CT w contrast: No results found for this or any previous visit. Hip-L CT w contrast: No results found for this or any previous visit. Hip-R CT w/wo contrast: No results found for this or any previous visit. Hip-L CT w/wo contrast: No results found for this or any previous visit. Hip-R CT wo contrast: No results found for this or any previous visit. Hip-L CT wo contrast: No results found for this or any previous visit. Hip-R DG 2-3 views: No results found for this or any previous visit. Hip-L DG 2-3 views: No results found for this or any previous visit. Hip-R DG Arthrogram: No results found for this or any previous visit. Hip-L DG Arthrogram: No results found for this or any previous visit. Hip-B DG Bilateral: No results found for this or any previous visit.  Knee Imaging: Knee-R MR w contrast: No results found for this or any previous visit. Knee-L MR w contrast: No results found for this or any previous visit. Knee-R MR w/wo contrast: No results found for this or any previous visit. Knee-L MR w/wo contrast: No results found for this or any previous visit. Knee-R MR wo contrast: No results found for this or any previous visit. Knee-L MR wo contrast: No results found for this or any previous  visit. Knee-R CT w contrast: No results found for this or any previous visit. Knee-L CT w contrast: No results found for this or any previous visit. Knee-R CT w/wo contrast: No results found for this or any previous visit. Knee-L CT w/wo contrast: No results found for this or any previous visit. Knee-R CT wo contrast: No results found for this or any previous visit. Knee-L CT wo contrast: No results found for this or any previous visit. Knee-R DG 1-2 views: No results found for this or any previous visit. Knee-L DG 1-2 views: No results found for this or any previous visit. Knee-R DG 3 views: No results found for this or any previous visit. Knee-L DG 3 views: No results found for this or any previous visit. Knee-R DG 4 views: No results found for this or any previous visit. Knee-L DG 4 views: No results found for this or any previous visit. Knee-R DG Arthrogram: No results found for this or any previous visit. Knee-L DG Arthrogram: No results found for this or any previous visit.  Ankle Imaging: Ankle-R DG Complete: No results found for this or any previous visit. Ankle-L DG Complete: No results found for this or any previous visit.  Foot Imaging: Foot-R DG Complete: No results found for this or any previous visit. Foot-L DG Complete: No results found for this or any previous visit.  Elbow Imaging: Elbow-R DG Complete: No results found for this or any previous visit. Elbow-L DG Complete: No results found for  this or any previous visit.  Wrist Imaging: Wrist-R DG Complete: No results found for this or any previous visit. Wrist-L DG Complete: No results found for this or any previous visit.  Hand Imaging: Hand-R DG Complete: No results found for this or any previous visit. Hand-L DG Complete: No results found for this or any previous visit.  Complexity Note: Imaging results reviewed. Results shared with Ms. Witts, using Layman's terms.                         ROS   Cardiovascular: {Hx; Cardiovascular History:210120525} Pulmonary or Respiratory: {Hx; Pumonary and/or Respiratory History:210120523} Neurological: {Hx; Neurological:210120504} Review of Past Neurological Studies:  Results for orders placed or performed during the hospital encounter of 01/04/09  MR Brain W Wo Contrast   Narrative   Clinical Data: Headaches and blurred vision.  Numbness in both arms.   MRI HEAD WITHOUT AND WITH CONTRAST   Technique:  Multiplanar, multiecho pulse sequences of the brain and surrounding structures were obtained according to standard protocol without and with intravenous contrast   Contrast: At 20 ml Multihance.   Comparison: None available.   Findings: No acute intracranial abnormalities are present. Specifically, there is no evidence for acute infarct, hemorrhage, mass, hydrocephalus, or extra-axial fluid collection.  No significant white matter disease is present.  Flow is present in the major intracranial arteries.  The globes and orbits are intact. The paranasal sinuses and mastoid air cells are clear.   Post contrast images demonstrate no areas of pathologic enhancement.   IMPRESSION: No acute intracranial abnormality or focal lesion to explain symptoms.  Provider: Loni Beckwith   Psychological-Psychiatric: {Hx; Psychological-Psychiatric History:210120512} Gastrointestinal: {Hx; Gastrointestinal:210120527} Genitourinary: {Hx; Genitourinary:210120506} Hematological: {Hx; Hematological:210120510} Endocrine: {Hx; Endocrine history:210120509} Rheumatologic: {Hx; Rheumatological:210120530} Musculoskeletal: {Hx; Musculoskeletal:210120528} Work History: {Hx; Work history:210120514}  Allergies  Ms. Goold is allergic to compazine [prochlorperazine edisylate] and ketorolac.  Laboratory Chemistry  Inflammation Markers (CRP: Acute Phase) (ESR: Chronic Phase) No results found for: CRP, ESRSEDRATE, LATICACIDVEN                        Rheumatology Markers No results found for: RF, ANA, LABURIC, URICUR, LYMEIGGIGMAB, LYMEABIGMQN, HLAB27                      Renal Function Markers Lab Results  Component Value Date   BUN 10 01/18/2017   CREATININE 0.66 01/18/2017   GFRAA >60 01/18/2017   GFRNONAA >60 01/18/2017                             Hepatic Function Markers Lab Results  Component Value Date   AST 24 01/18/2017   ALT 17 01/18/2017   ALBUMIN 4.0 01/18/2017   ALKPHOS 58 01/18/2017   AMYLASE 75 10/26/2014   LIPASE 28 01/18/2017                        Electrolytes Lab Results  Component Value Date   NA 139 01/18/2017   K 4.0 01/18/2017   CL 105 01/18/2017   CALCIUM 9.2 01/18/2017   MG 1.9 07/05/2015   PHOS 3.5 10/26/2014                        Neuropathy Markers Lab Results  Component Value Date   VITAMINB12 193 07/05/2015   FOLATE 8.0  07/05/2015   HGBA1C 5.1 07/05/2015                        Bone Pathology Markers Lab Results  Component Value Date   VD25OH 10.0 (L) 07/05/2015   VD125OH2TOT 111 10/26/2014   [id number] 111 10/26/2014   [id number] <10 10/26/2014                         Coagulation Parameters Lab Results  Component Value Date   INR 0.97 10/26/2014   LABPROT 13.1 10/26/2014   PLT 196 07/10/2017                        Cardiovascular Markers Lab Results  Component Value Date   CKTOTAL 66 09/22/2015   TROPONINI <0.03 05/14/2016   HGB 12.8 07/10/2017   HCT 38.5 07/10/2017                         CA Markers No results found for: CEA, CA125, LABCA2                      Note: Lab results reviewed.  [place]  Drug: Ms. Diliberto  reports that she does not use drugs. Alcohol:  reports that she does not drink alcohol. Tobacco:  reports that she quit smoking about 18 years ago. Her smoking use included cigarettes. She smoked 0.00 packs per day for 8.00 years. She has never used smokeless tobacco. Medical:  has a past medical history of Anemia, Anxiety, Bulge of cervical disc  without myelopathy, Bulging lumbar disc, Depression, Family history of adverse reaction to anesthesia, GERD (gastroesophageal reflux disease), Heart murmur (2005), History of cardiovascular stress test (2005), History of kidney stones, Hypertension, IDA (iron deficiency anemia), PONV (postoperative nausea and vomiting), Sleep apnea, and Wears contact lenses. Family: family history includes Breast cancer in her paternal aunt and paternal grandmother; Breast cancer (age of onset: 14) in her mother; Cancer in her maternal uncle and mother; Colon polyps in her father and mother; Diabetes in her mother; Hyperlipidemia in her father and mother; Hypertension in her brother, father, mother, and sister.  Past Surgical History:  Procedure Laterality Date  . ABDOMINAL HYSTERECTOMY  12/2014   ovaries in situ  . BALLOON DILATION  08/29/2017   Procedure: BALLOON DILATION;  Surgeon: [name], MD;  Location: [facility];  Service: Endoscopy;;  . BILATERAL SALPINGECTOMY Bilateral 12/27/2014   Procedure: BILATERAL SALPINGECTOMY;  Surgeon: Will Bonnet, MD;  Location: [facility];  Service: Gynecology;  Laterality: Bilateral;  . COLON SURGERY  08/01/2017   Resection and revision of Bypass - [facility], Dr Duke Salvia  . COLONOSCOPY  2010  . CYSTOSCOPY N/A 12/27/2014   Procedure: CYSTOSCOPY;  Surgeon: Will Bonnet, MD;  Location: [facility];  Service: Gynecology;  Laterality: N/A;  . DILATION AND CURETTAGE OF UTERUS    . ELBOW ARTHROSCOPY Left 03/30/2015   Procedure: ARTHROSCOPY ELBOW WITH DEBRIDEMENT OF THE SYMPTOMATIC PLICA;  Surgeon: Corky Mull, MD;  Location: [place];  Service: Orthopedics;  Laterality: Left;  . ELBOW SURGERY Left   . ERCP    . ESOPHAGOGASTRODUODENOSCOPY (EGD) WITH PROPOFOL N/A 06/06/2016   Procedure: ESOPHAGOGASTRODUODENOSCOPY (EGD) WITH PROPOFOL;  Surgeon: [name], MD;  Location: [place];  Service: Endoscopy;  Laterality: N/A;  . ESOPHAGOGASTRODUODENOSCOPY  (EGD) WITH PROPOFOL N/A 08/29/2017   Procedure: ESOPHAGOGASTRODUODENOSCOPY (  EGD) WITH PROPOFOL;  Surgeon: [name], MD;  Location: [place];  Service: Endoscopy;  Laterality: N/A;  . ESOPHAGOGASTRODUODENOSCOPY (EGD) WITH PROPOFOL N/A 01/08/2018   Procedure: ESOPHAGOGASTRODUODENOSCOPY (EGD) WITH PROPOFOL;  Surgeon: Lin Landsman, MD;  Location: [facility] ENDOSCOPY;  Service: Gastroenterology;  Laterality: N/A;  . gallbladder removed    . GASTRIC BYPASS  2012  . LAPAROSCOPIC HYSTERECTOMY N/A 12/27/2014   Procedure: HYSTERECTOMY TOTAL LAPAROSCOPIC;  Surgeon: Will Bonnet, MD;  Location: [facility];  Service: Gynecology;  Laterality: N/A;  . laproscopy    . RENAL ENDOSCOPY VIA NEPHROSTOMY / PYELOSTOMY  2010  . TUBAL LIGATION     Active Ambulatory Problems    Diagnosis Date Noted  . IDA (iron deficiency anemia) 12/01/2014  . Status post laparoscopic hysterectomy 12/27/2014  . Chronic female pelvic pain 12/08/2014  . Multinodular goiter 01/02/2015  . Allergic rhinitis 02/18/2015  . Anxiety 02/18/2015  . Gastroesophageal reflux disease 10/19/2009  . H/O renal calculi 02/18/2015  . 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 Neuritis or radiculitis due to rupture of lumbar intervertebral disc 06/07/2015  . 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 DDD (degenerative disc disease), lumbar 10/03/2015  . Abdominal pain, chronic, epigastric 01/29/2012  . Abdominal pain, epigastric   . Bariatric surgery status   . Esophageal dysphagia   . Gastrojejunal anastomotic stricture   . History of Roux-en-Y gastric bypass    Resolved Ambulatory Problems    Diagnosis Date Noted  . DYSPNEA 12/29/2009  . CHEST PAIN UNSPECIFIED 12/29/2009  . Dizziness 03/25/2011  . Palpitations 03/25/2011  . Dysmenorrhea 12/08/2014  . Coitalgia 12/08/2014  . Abnormal LFTs 01/29/2012  . Adiposity 01/02/2015  . Calculi, ureter 11/15/2013  . Calculus (=stone) 01/02/2015  . Absolute anemia 02/18/2015  .  Endometriosis of uterus 09/16/2008  . BP (high blood pressure) 02/18/2015  . Obstructive apnea 02/18/2015  . Plica syndrome 16/03/9603  . Obesity 04/07/2015  . Tachycardia 07/25/2015  . Chest pain 09/22/2015  . Left arm pain 09/22/2015  . Facet syndrome, lumbar 10/03/2015  . Lumbar radiculopathy 10/03/2015  . Adiposity 10/27/2015  . H/O sinus tachycardia 12/21/2015  . Gastric ulcer 08/11/2017   Past Medical History:  Diagnosis Date  . Anemia   . 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 Bulging lumbar disc   . Depression   . Family history of adverse reaction to anesthesia   . GERD (gastroesophageal reflux disease)   . Heart murmur 2005  . History of cardiovascular stress test 2005  . History of kidney stones   . Hypertension   . PONV (postoperative nausea and vomiting)   . Sleep apnea   . Wears contact lenses    Constitutional Exam  General appearance: Well nourished, well developed, and well hydrated. In no apparent acute distress Vitals:   01/21/18 1330  BP: (!) 122/59  Pulse: 86  Resp: 16  Temp: 98 F (36.7 C)  SpO2: 100%  Weight: 196 lb (88.9 kg)  Height: '5\' 9"'  (1.753 m)   BMI Assessment: Estimated body mass index is 28.94 kg/m as calculated from the following:   Height as of this encounter: '5\' 9"'  (1.753 m).   Weight as of this encounter: 196 lb (88.9 kg).  BMI interpretation table: BMI level Category Range association with higher incidence of chronic pain  <18 kg/m2 Underweight   18.5-24.9 kg/m2 Ideal body weight   25-29.9 kg/m2 Overweight Increased incidence by 20%  30-34.9 kg/m2 Obese (Class I) Increased incidence by  68%  35-39.9 kg/m2 Severe obesity (Class II) Increased incidence by 136%  >40 kg/m2 Extreme obesity (Class III) Increased incidence by 254%   Patient's current BMI Ideal Body weight  Body mass index is 28.94 kg/m. Ideal body weight: 66.2 kg (145 lb 15.1 oz) Adjusted ideal body weight: 75.3 kg (165 lb 15.5 oz)   BMI Readings from  Last 4 Encounters:  01/21/18 28.94 kg/m  01/08/18 28.65 kg/m  01/07/18 28.94 kg/m  12/01/17 27.91 kg/m   Wt Readings from Last 4 Encounters:  01/21/18 196 lb (88.9 kg)  01/08/18 194 lb 0.1 oz (88 kg)  01/07/18 196 lb (88.9 kg)  12/01/17 189 lb (85.7 kg)  Psych/Mental status: Alert, oriented x 3 (person, place, & time)       Eyes: PERLA Respiratory: No evidence of acute respiratory distress  Cervical Spine Area Exam  Skin & Axial Inspection: No masses, redness, edema, swelling, or associated skin lesions Alignment: Symmetrical Functional ROM: Unrestricted ROM      Stability: No instability detected Muscle Tone/Strength: Functionally intact. No obvious neuro-muscular anomalies detected. Sensory (Neurological): Unimpaired Palpation: No palpable anomalies              Upper Extremity (UE) Exam    Side: Right upper extremity  Side: Left upper extremity  Skin & Extremity Inspection: Skin color, temperature, and hair growth are WNL. No peripheral edema or cyanosis. No masses, redness, swelling, asymmetry, or associated skin lesions. No contractures.  Skin & Extremity Inspection: Skin color, temperature, and hair growth are WNL. No peripheral edema or cyanosis. No masses, redness, swelling, asymmetry, or associated skin lesions. No contractures.  Functional ROM: Unrestricted ROM          Functional ROM: Unrestricted ROM          Muscle Tone/Strength: Functionally intact. No obvious neuro-muscular anomalies detected.  Muscle Tone/Strength: Functionally intact. No obvious neuro-muscular anomalies detected.  Sensory (Neurological): Unimpaired          Sensory (Neurological): Unimpaired          Palpation: No palpable anomalies              Palpation: No palpable anomalies              Provocative Test(s):  Phalen's test: deferred Tinel's test: deferred Apley's scratch test (touch opposite shoulder):  Action 1 (Across chest): deferred Action 2 (Overhead): deferred Action 3 (LB reach):  deferred   Provocative Test(s):  Phalen's test: deferred Tinel's test: deferred Apley's scratch test (touch opposite shoulder):  Action 1 (Across chest): deferred Action 2 (Overhead): deferred Action 3 (LB reach): deferred    Thoracic Spine Area Exam  Skin & Axial Inspection: No masses, redness, or swelling Alignment: Symmetrical Functional ROM: Unrestricted ROM Stability: No instability detected Muscle Tone/Strength: Functionally intact. No obvious neuro-muscular anomalies detected. Sensory (Neurological): Unimpaired Muscle strength & Tone: No palpable anomalies  Lumbar Spine Area Exam  Skin & Axial Inspection: No masses, redness, or swelling Alignment: Symmetrical Functional ROM: Unrestricted ROM       Stability: No instability detected Muscle Tone/Strength: Functionally intact. No obvious neuro-muscular anomalies detected. Sensory (Neurological): Unimpaired Palpation: No palpable anomalies       Provocative Tests: Hyperextension/rotation test: deferred today       Lumbar quadrant test (Kemp's test): deferred today       Lateral bending test: deferred today       Patrick's Maneuver: deferred today  FABER test: deferred today                   S-I anterior distraction/compression test: deferred today         S-I lateral compression test: deferred today         S-I Thigh-thrust test: deferred today         S-I Gaenslen's test: deferred today          Gait & Posture Assessment  Ambulation: Unassisted Gait: Relatively normal for age and body habitus Posture: WNL   Lower Extremity Exam    Side: Right lower extremity  Side: Left lower extremity  Stability: No instability observed          Stability: No instability observed          Skin & Extremity Inspection: Skin color, temperature, and hair growth are WNL. No peripheral edema or cyanosis. No masses, redness, swelling, asymmetry, or associated skin lesions. No contractures.  Skin & Extremity Inspection:  Skin color, temperature, and hair growth are WNL. No peripheral edema or cyanosis. No masses, redness, swelling, asymmetry, or associated skin lesions. No contractures.  Functional ROM: Unrestricted ROM                  Functional ROM: Unrestricted ROM                  Muscle Tone/Strength: Functionally intact. No obvious neuro-muscular anomalies detected.  Muscle Tone/Strength: Functionally intact. No obvious neuro-muscular anomalies detected.  Sensory (Neurological): Unimpaired  Sensory (Neurological): Unimpaired  Palpation: No palpable anomalies  Palpation: No palpable anomalies   Assessment  Primary Diagnosis & Pertinent Problem List: The primary encounter diagnosis was Lumbar radiculopathy (L>R). Diagnoses of Neuritis or radiculitis due to rupture of lumbar intervertebral disc and Lumbar herniated disc were also pertinent to this visit.  Visit Diagnosis (New problems to examiner): 1. Lumbar radiculopathy (L>R)   2. Neuritis or radiculitis due to rupture of lumbar intervertebral disc   3. Lumbar herniated disc    Plan of Care (Initial workup plan)  Note: Please be advised that as per protocol, today's visit has been an evaluation only. We have not taken over the patient's controlled substance management.  Problem-specific plan: No problem-specific Assessment & Plan notes found for this encounter.  Ordered Lab-work, Procedure(s), Referral(s), & Consult(s): Orders Placed This Encounter  Procedures  . Ambulatory referral to Psychology   Pharmacotherapy (current): Medications ordered:  No orders of the defined types were placed in this encounter.  Medications administered during this visit: Karmon Y. Seckman had no medications administered during this visit.   Pharmacological management options:  Opioid Analgesics: The patient was informed that there is no guarantee that she would be a candidate for opioid analgesics. The decision will be made following CDC guidelines. This  decision will be based on the results of diagnostic studies, as well as Ms. Robley risk profile.   Membrane stabilizer: To be determined at a later time  Muscle relaxant: To be determined at a later time  NSAID: To be determined at a later time  Other analgesic(s): To be determined at a later time   Interventional management options: Ms. Rhody was informed that there is no guarantee that she would be a candidate for interventional therapies. The decision will be based on the results of diagnostic studies, as well as Ms. Coplen risk profile.  Procedure(s) under consideration:  ***   Provider-requested follow-up: Return in about 3 weeks (around 02/11/2018) for After  Psychological evaluation.  Future Appointments  Date Time Provider [place]  02/06/2018  1:30 PM Sindy Guadeloupe, MD [facility] None    Primary Care Physician: Carmon Ginsberg, PA Location: [facility] Note by: Gillis Santa, M.D, Date: 01/21/2018; Time: 3:15 PM  There are no Patient Instructions on file for this visit.

## 2018-01-22 DIAGNOSIS — M5416 Radiculopathy, lumbar region: Secondary | ICD-10-CM | POA: Insufficient documentation

## 2018-01-25 ENCOUNTER — Ambulatory Visit: Admission: RE | Admit: 2018-01-25 | Payer: 59 | Source: Ambulatory Visit

## 2018-01-26 ENCOUNTER — Other Ambulatory Visit: Payer: Self-pay

## 2018-01-27 ENCOUNTER — Encounter: Payer: Self-pay | Admitting: Psychology

## 2018-01-28 ENCOUNTER — Other Ambulatory Visit: Payer: Self-pay

## 2018-01-28 ENCOUNTER — Encounter: Payer: Self-pay | Admitting: *Deleted

## 2018-01-29 ENCOUNTER — Ambulatory Visit: Payer: 59 | Admitting: Family Medicine

## 2018-02-03 ENCOUNTER — Telehealth: Payer: Self-pay | Admitting: Student in an Organized Health Care Education/Training Program

## 2018-02-03 ENCOUNTER — Encounter: Payer: Self-pay | Admitting: Family Medicine

## 2018-02-03 ENCOUNTER — Encounter: Payer: Self-pay | Admitting: Student in an Organized Health Care Education/Training Program

## 2018-02-03 NOTE — Telephone Encounter (Signed)
Patient lvmail Fri 01-30-18 at 11:25 asking if there was something Dr. Holley Raring could do to help with pain until she gets SCS trial. Her SCS psych eval isnt until the end of Oct. She is in a lot of pain.

## 2018-02-03 NOTE — Telephone Encounter (Signed)
Patient states that her psych appointment is not until October.  Her PCP has decreased her pain med from 6 per day until 4 per day.  She states that she is in a lot of pain and is having difficulty even going to work.  Would like to  knoe if there was anything you could do to help her?

## 2018-02-03 NOTE — Telephone Encounter (Signed)
Attempted to call patient.  LEft message for her to call us back.

## 2018-02-04 ENCOUNTER — Encounter: Payer: Self-pay | Admitting: Family Medicine

## 2018-02-04 ENCOUNTER — Telehealth: Payer: Self-pay

## 2018-02-04 ENCOUNTER — Ambulatory Visit (INDEPENDENT_AMBULATORY_CARE_PROVIDER_SITE_OTHER): Payer: 59 | Admitting: Family Medicine

## 2018-02-04 VITALS — BP 110/80 | HR 74 | Temp 98.5°F | Resp 16 | Wt 198.8 lb

## 2018-02-04 DIAGNOSIS — M545 Low back pain, unspecified: Secondary | ICD-10-CM

## 2018-02-04 DIAGNOSIS — G8929 Other chronic pain: Secondary | ICD-10-CM | POA: Diagnosis not present

## 2018-02-04 MED ORDER — PREDNISONE 20 MG PO TABS: ORAL_TABLET | ORAL | 0 refills | Status: AC

## 2018-02-04 MED ORDER — KETOROLAC TROMETHAMINE 60 MG/2ML IM SOLN
60.0000 mg | Freq: Once | INTRAMUSCULAR | Status: AC
  Administered 2018-02-04: 60 mg via INTRAMUSCULAR

## 2018-02-04 NOTE — Progress Notes (Signed)
  Subjective:     Patient ID: Misty Zamora, female   DOB: [birth year], 42 y.o.   MRN: [id number] Chief Complaint  Patient presents with  . Back Pain    Patient returns to [facility] today to address pain in lower back. Patient states that she was seen at pain clinic for consult for pain stimulator. She states that he was advised that she needs a psych consult prior to getting stimulator, patient eports that appt with psych isnt until 03/26/2018 and reports pain is constant. Patient states that when she walks she feels that she is going to fall over at times and describes pain as burning.    HPI Localizes pain to her lower lumbar vertebral area. She wishes an increase in pain medication as I have tapered her from oxycodone 60 mg.tp 40 mg. Over two months. 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 This is all compounded by being involved in a law suit with her former employer. She is transferring her care to [facility] 9/25 as she is an employee there now.  Review of Systems     Objective:   Physical Exam  Constitutional: She appears well-developed and well-nourished. No distress.  Musculoskeletal:  Muscle strength in lower extremities 5/5. SLR's to 90 degrees without radiation of back pain. Mild tenderness at her sacral vertebral area.  Psychiatric:  Flat affect       Assessment:    1. Chronic bilateral low back pain without sciatica; Prednisone taper - ketorolac (TORADOL) injection 60 mg    Plan:    F/u with the pain clinic and [place] as scheduled.

## 2018-02-04 NOTE — Telephone Encounter (Signed)
Pt called and understands Dr Holley Raring is out but she is in a lot of pain and wants someone to call her with suggestions on how to handle the pain

## 2018-02-04 NOTE — Telephone Encounter (Signed)
Patient states she is at primary care office and they will not give her any more pain meds than she is currently on because they think it is her anxiety. Discussed with patient that Dr Holley Raring has not ordered medication as of yet and that he is not in the office.  Patient states PCP is weaning her off of her pain meds.  Informed patient that if she felt it was necessary, she could go to the ED.  Patient states understanding.

## 2018-02-04 NOTE — Patient Instructions (Signed)
Do follow up with Dr. Holley Raring and Dr. Doy Hutching as scheduled.

## 2018-02-05 ENCOUNTER — Telehealth: Payer: Self-pay

## 2018-02-05 ENCOUNTER — Encounter: Payer: Self-pay | Admitting: Family Medicine

## 2018-02-05 ENCOUNTER — Encounter: Payer: 59 | Attending: Psychology | Admitting: Psychology

## 2018-02-05 ENCOUNTER — Encounter: Payer: Self-pay | Admitting: Psychology

## 2018-02-05 DIAGNOSIS — Z8249 Family history of ischemic heart disease and other diseases of the circulatory system: Secondary | ICD-10-CM | POA: Insufficient documentation

## 2018-02-05 DIAGNOSIS — Z833 Family history of diabetes mellitus: Secondary | ICD-10-CM | POA: Insufficient documentation

## 2018-02-05 DIAGNOSIS — M5416 Radiculopathy, lumbar region: Secondary | ICD-10-CM | POA: Diagnosis not present

## 2018-02-05 DIAGNOSIS — Z90710 Acquired absence of both cervix and uterus: Secondary | ICD-10-CM | POA: Diagnosis not present

## 2018-02-05 DIAGNOSIS — F064 Anxiety disorder due to known physiological condition: Secondary | ICD-10-CM

## 2018-02-05 DIAGNOSIS — F419 Anxiety disorder, unspecified: Secondary | ICD-10-CM | POA: Diagnosis not present

## 2018-02-05 DIAGNOSIS — Z8739 Personal history of other diseases of the musculoskeletal system and connective tissue: Secondary | ICD-10-CM

## 2018-02-05 NOTE — Telephone Encounter (Signed)
She was able to get in earlier for the scs psyc eval. She also wants to continue seeing Dr. Holley Raring for pain management until the scs is placed because her pcp is not treating her for pain any longer.

## 2018-02-05 NOTE — Discharge Instructions (Signed)
General Anesthesia, Adult, Care After °These instructions provide you with information about caring for yourself after your procedure. Your health care provider may also give you more specific instructions. Your treatment has been planned according to current medical practices, but problems sometimes occur. Call your health care provider if you have any problems or questions after your procedure. °What can I expect after the procedure? °After the procedure, it is common to have: °· Vomiting. °· A sore throat. °· Mental slowness. ° °It is common to feel: °· Nauseous. °· Cold or shivery. °· Sleepy. °· Tired. °· Sore or achy, even in parts of your body where you did not have surgery. ° °Follow these instructions at home: °For at least 24 hours after the procedure: °· Do not: °? Participate in activities where you could fall or become injured. °? Drive. °? Use heavy machinery. °? Drink alcohol. °? Take sleeping pills or medicines that cause drowsiness. °? Make important decisions or sign legal documents. °? Take care of children on your own. °· Rest. °Eating and drinking °· If you vomit, drink water, juice, or soup when you can drink without vomiting. °· Drink enough fluid to keep your urine clear or pale yellow. °· Make sure you have little or no nausea before eating solid foods. °· Follow the diet recommended by your health care provider. °General instructions °· Have a responsible adult stay with you until you are awake and alert. °· Return to your normal activities as told by your health care provider. Ask your health care provider what activities are safe for you. °· Take over-the-counter and prescription medicines only as told by your health care provider. °· If you smoke, do not smoke without supervision. °· Keep all follow-up visits as told by your health care provider. This is important. °Contact a health care provider if: °· You continue to have nausea or vomiting at home, and medicines are not helpful. °· You  cannot drink fluids or start eating again. °· You cannot urinate after 8-12 hours. °· You develop a skin rash. °· You have fever. °· You have increasing redness at the site of your procedure. °Get help right away if: °· You have difficulty breathing. °· You have chest pain. °· You have unexpected bleeding. °· You feel that you are having a life-threatening or urgent problem. °This information is not intended to replace advice given to you by your health care provider. Make sure you discuss any questions you have with your health care provider. °Document Released: 08/26/2000 Document Revised: 10/23/2015 Document Reviewed: 05/04/2015 °Elsevier Interactive Patient Education © 2018 Elsevier Inc. ° °

## 2018-02-05 NOTE — Progress Notes (Signed)
Neuropsychological Consultation   Patient:   Misty Zamora   DOB:   December 10, 1975  MR Number:  [id number]  Location:  [facility] [place], [place] [id number] [place] [id number] Dept: [phone]           Date of Service:   02/05/2018  Start Time:   4 PM End Time:   5 PM  Provider/Observer:  Ilean Skill, Psy.D.       Clinical Neuropsychologist       Billing Code/Service: [phone] 4 Units  Chief Complaint:    Misty Zamora is a 42 year old female referred by Dr. Holley Raring for psychological evaluation as part of the standard protocol and work-up for consideration of spinal cord stimulator trialing and possible implantation.  The patient has a history of back pain, leg pain as well as anxiety disorder.  The patient has been dealing with degenerative disc disease and lumbar radiculopathy for some time.  She reports that it started approximately 2 or 3 years ago.  Reason for Service:  Misty Zamora is a 42 year old female referred by Dr. Holley Raring for psychological evaluation as part of the standard work-up for consideration of spinal cord stimulator trialing and possible implantation.  The patient reports that she started having significant back pain that also radiated down her hip and leg 2 or 3 years ago.  The patient is seen 2 prior neurosurgeons but they did not feel she was a candidate for surgery at this time.  The patient was tried on gabapentin and Lyrica but these medications were not particularly helpful for her pain.  She also experienced significant weight gain with these medicines.  The patient has had several injections in her back and is currently taking oxycodone for her pain.  The patient reports that she began experiencing anxiety symptoms 5 or 6 years ago when her mother was diagnosed with breast cancer.  She reports that while her mother was treated and is now in  remission and doing quite well the patient continues to have some worries about death and other anxiety symptoms.  She did see a psychiatrist for a period of time who placed her on Paxil and it helped but eventually stopped and she does continue to take a low-dose Klonopin for her anxiety.  The patient also has a prior history of gastric bypass surgery which she felt was quite successful.  Besides her surgery for gastric bypass she also had to have a revision done of the gastric bypass due to some complications.  She has had an endoscopic done, hysterectomy, as well as having her gallbladder removed as well.  The patient reports that she has significant difficulty sleeping well due to pain.  She describes her appetite is fine and that cognitive function is fine.  The patient does feel rather helpless and not being able to help around the family and do the things that she used to be do able to do.  Current Status:  The patient describes significant ongoing pain related to her lumbar radiculopathy and degenerative disc disease.  She does acknowledge some anxiety but is generally under control.  Reliability of Information: The information is derived from 1 hour face-to-face clinical interview as well as review of available medical records.  Behavioral Observation: Misty Zamora  presents as a 42 y.o.-year-old Right Caucasian Female who appeared her stated age. her dress was Appropriate and she was Well Groomed and her manners were  Appropriate to the situation.  her participation was indicative of Appropriate and Attentive behaviors.  There were not any physical disabilities noted.  she displayed an appropriate level of cooperation and motivation.     Interactions:    Active Appropriate and Attentive  Attention:   within normal limits and attention span and concentration were age appropriate  Memory:   within normal limits; recent and remote memory intact  Visuo-spatial:  within normal limits  Speech  (Volume):  normal  Speech:   normal; normal  Thought Process:  Coherent and Relevant  Though Content:  WNL; not suicidal and not homicidal  Orientation:   person, place, time/date and situation  Judgment:   Good  Planning:   Good  Affect:    Anxious  Mood:    Anxious  Insight:   Good  Intelligence:   normal  Marital Status/Living: The patient reports that she was born and raised in [place] with 2 siblings.  The patient was born via C-section and weighed 9 pounds at birth.  Walking and talking milestones were achieved at the appropriate time.  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  Substance Use:  No concerns of substance abuse are reported.the patient does take opiate pain medications for her pain symptoms but has no history of abuse or difficulties with those.  She also takes a low-dose Klonopin for anxiety.  Education:   HS Graduate  Medical History:   Past Medical History:  Diagnosis Date  . Anemia    had iron infusions. last one in Sept. told levels are now normal  . Anxiety   . 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 Bulging lumbar disc    causes bilateral leg pain  . Depression   . Family history of adverse reaction to anesthesia    Father - PONV  . GERD (gastroesophageal reflux disease)    occasional  . Heart murmur 2005  . History of cardiovascular stress test 2005   showed MR and TR  . History of kidney stones   . Hypertension    no meds since gastric bypass  . IDA (iron deficiency anemia)   . PONV (postoperative nausea and vomiting)    hypotension with epidural  . Sleep apnea    was better after gastric bypass, hasd recently started snoring again.  . Wears contact lenses          Abuse/Trauma History: The  patient does not report any prior history of abuse or trauma.  Psychiatric History:  The patient does have a history of anxiety that she reports started about 5 or 6 years ago after her mother was diagnosed with breast cancer.  Even though her mother has recovered and is doing quite well the patient is continued to have some level of anxiety particular around anxiety of people getting sick or having medical issues and dying.  Family Med/Psych History:  Family History  Problem Relation Age of Onset  . Hypertension Mother   . Hyperlipidemia Mother   . Diabetes Mother   . Cancer Mother        breast cancer; 04/2011  . Colon polyps Mother   . Breast cancer Mother 55  . Hyperlipidemia Father   . Hypertension Father   . Colon polyps Father   .  Hypertension Sister   . Hypertension Brother   . Cancer Maternal Uncle        terminal lung cancer  . Breast cancer Paternal Aunt        59's  . Breast cancer Paternal Grandmother   . Bladder Cancer Neg Hx   . Kidney cancer Neg Hx     Risk of Suicide/Violence: virtually non-existent the patient denies any suicidal or homicidal ideation.  Impression/DX:  Misty Zamora is a 43 year old female referred by Dr. Holley Raring for psychological evaluation as part of the standard work-up for consideration of spinal cord stimulator trialing and possible implantation.  The patient reports that she started having significant back pain that also radiated down her hip and leg 2 or 3 years ago.  The patient is seen 2 prior neurosurgeons but they did not feel she was a candidate for surgery at this time.  The patient was tried on gabapentin and Lyrica but these medications were not particularly helpful for her pain.  She also experienced significant weight gain with these medicines.  The patient has had several injections in her back and is currently taking oxycodone for her pain.  The patient reports that she began experiencing anxiety symptoms 5 or 6 years ago when her  mother was diagnosed with breast cancer.  She reports that while her mother was treated and is now in remission and doing quite well the patient continues to have some worries about death and other anxiety symptoms.  She did see a psychiatrist for a period of time who placed her on Paxil and it helped but eventually stopped and she does continue to take a low-dose Klonopin for her anxiety.  The patient also has a prior history of gastric bypass surgery which she felt was quite successful.  Besides her surgery for gastric bypass she also had to have a revision done of the gastric bypass due to some complications.  She has had an endoscopic done, hysterectomy, as well as having her gallbladder removed as well.  The patient reports that she has significant difficulty sleeping well due to pain.  She describes her appetite is fine and that cognitive function is fine.  The patient does feel rather helpless and not being able to help around the family and do the things that she used to be do able to do.  The patient describes significant ongoing pain related to her lumbar radiculopathy and degenerative disc disease.  She does acknowledge some anxiety but is generally under control.  Disposition/Plan:  The patient will complete the [place] multiphasic personality inventory-IV as well as the pain patient profile.  Once these objective assessment tools are completed a formal psychological report will be completed and supplied to her referring physician.  Diagnosis:    Lumbar radiculopathy  H/O degenerative disc disease  Anxiety disorder due to known physiological condition         Electronically Signed   _______________________ Ilean Skill, Psy.D.

## 2018-02-06 ENCOUNTER — Ambulatory Visit: Payer: 59 | Admitting: Anesthesiology

## 2018-02-06 ENCOUNTER — Encounter
Admission: RE | Disposition: A | Discharge status: Home / Self Care | Payer: Self-pay | Source: Ambulatory Visit | Attending: Gastroenterology

## 2018-02-06 ENCOUNTER — Inpatient Hospital Stay: Payer: 59 | Attending: Oncology | Admitting: Oncology

## 2018-02-06 ENCOUNTER — Ambulatory Visit
Admission: RE | Admit: 2018-02-06 | Discharge: 2018-02-06 | Disposition: A | Discharge status: Home / Self Care | Payer: 59 | Source: Ambulatory Visit | Attending: Gastroenterology | Admitting: Gastroenterology

## 2018-02-06 DIAGNOSIS — Z87891 Personal history of nicotine dependence: Secondary | ICD-10-CM | POA: Diagnosis not present

## 2018-02-06 DIAGNOSIS — K9589 Other complications of other bariatric procedure: Secondary | ICD-10-CM | POA: Diagnosis present

## 2018-02-06 DIAGNOSIS — R1319 Other dysphagia: Secondary | ICD-10-CM

## 2018-02-06 DIAGNOSIS — I10 Essential (primary) hypertension: Secondary | ICD-10-CM | POA: Diagnosis not present

## 2018-02-06 DIAGNOSIS — F419 Anxiety disorder, unspecified: Secondary | ICD-10-CM | POA: Insufficient documentation

## 2018-02-06 DIAGNOSIS — K9189 Other postprocedural complications and disorders of digestive system: Secondary | ICD-10-CM | POA: Diagnosis not present

## 2018-02-06 DIAGNOSIS — R1310 Dysphagia, unspecified: Secondary | ICD-10-CM

## 2018-02-06 DIAGNOSIS — Z980 Intestinal bypass and anastomosis status: Secondary | ICD-10-CM | POA: Diagnosis not present

## 2018-02-06 DIAGNOSIS — Z9884 Bariatric surgery status: Secondary | ICD-10-CM | POA: Insufficient documentation

## 2018-02-06 DIAGNOSIS — G4730 Sleep apnea, unspecified: Secondary | ICD-10-CM | POA: Diagnosis not present

## 2018-02-06 HISTORY — PX: ESOPHAGOGASTRODUODENOSCOPY (EGD) WITH PROPOFOL: SHX5813

## 2018-02-06 SURGERY — ESOPHAGOGASTRODUODENOSCOPY (EGD) WITH PROPOFOL
Anesthesia: General | Site: Throat | Wound class: Clean Contaminated

## 2018-02-06 MED ORDER — SODIUM CHLORIDE 0.9 % IV SOLN: INTRAVENOUS | Status: DC

## 2018-02-06 MED ORDER — LACTATED RINGERS IV SOLN
INTRAVENOUS | Status: DC
  Administered 2018-02-06: 13:00:00 via INTRAVENOUS

## 2018-02-06 MED ORDER — PROPOFOL 10 MG/ML IV BOLUS
INTRAVENOUS | Status: DC | PRN
  Administered 2018-02-06: 40 mg via INTRAVENOUS
  Administered 2018-02-06 (×2): 30 mg via INTRAVENOUS
  Administered 2018-02-06: 20 mg via INTRAVENOUS
  Administered 2018-02-06: 80 mg via INTRAVENOUS
  Administered 2018-02-06: 20 mg via INTRAVENOUS

## 2018-02-06 MED ORDER — LIDOCAINE HCL (CARDIAC) PF 100 MG/5ML IV SOSY
PREFILLED_SYRINGE | INTRAVENOUS | Status: DC | PRN
  Administered 2018-02-06: 40 mg via INTRAVENOUS

## 2018-02-06 MED ORDER — ACETAMINOPHEN 325 MG PO TABS
325.0000 mg | ORAL_TABLET | ORAL | Status: DC | PRN
  Administered 2018-02-06: 650 mg via ORAL

## 2018-02-06 MED ORDER — GLYCOPYRROLATE 0.2 MG/ML IJ SOLN
INTRAMUSCULAR | Status: DC | PRN
  Administered 2018-02-06: 0.2 mg via INTRAVENOUS

## 2018-02-06 MED ORDER — ACETAMINOPHEN 160 MG/5ML PO SOLN: 325.0000 mg | ORAL | Status: DC | PRN

## 2018-02-06 SURGICAL SUPPLY — 8 items
BALLN DILATOR CRE 15-18 240 (BALLOONS) ×2 IMPLANT
BLOCK BITE 60FR ADLT L/F GRN (MISCELLANEOUS) ×2 IMPLANT
CANISTER SUCT 1200ML W/VALVE (MISCELLANEOUS) ×2 IMPLANT
GOWN CVR UNV OPN BCK APRN NK (MISCELLANEOUS) ×2 IMPLANT
GOWN ISOL THUMB LOOP REG UNIV (MISCELLANEOUS) ×2
KIT ENDO PROCEDURE OLY (KITS) ×2 IMPLANT
SYR INFLATION 60ML (SYRINGE) ×2 IMPLANT
WATER STERILE IRR 250ML POUR (IV SOLUTION) ×2 IMPLANT

## 2018-02-06 NOTE — Op Note (Signed)
[facility] Gastroenterology Patient Name: Misty Zamora Procedure Date: 02/06/2018 1:42 PM MRN: [id number] Account #: [id number] Date of Birth: Jun 26, 1975 Admit Type: Outpatient Age: 42 Room: [facility] OR ROOM 01 Gender: Female Note Status: Finalized Procedure:            Upper GI endoscopy Indications:          Management of operative complication: Dilation of                        anastomotic stricture Providers:            [name] MD, MD Referring MD:         Rose Fillers. Jaynie Crumble, MD (Referring MD) Medicines:            Propofol per Anesthesia Complications:        No immediate complications. Procedure:            Pre-Anesthesia Assessment:                       - Prior to the procedure, a History and Physical was                        performed, and patient medications and allergies were                        reviewed. The patient's tolerance of previous                        anesthesia was also reviewed. The risks and benefits of                        the procedure and the sedation options and risks were                        discussed with the patient. All questions were                        answered, and informed consent was obtained. Prior                        Anticoagulants: The patient has taken no previous                        anticoagulant or antiplatelet agents. ASA Grade                        Assessment: II - A patient with mild systemic disease.                        After reviewing the risks and benefits, the patient was                        deemed in satisfactory condition to undergo the                        procedure.                       After obtaining informed consent, the endoscope was  passed under direct vision. Throughout the procedure,                        the patient's blood pressure, pulse, and oxygen                        saturations were monitored continuously. The was   introduced through the mouth, and advanced to the                        second part of duodenum. The upper GI endoscopy was                        accomplished without difficulty. The patient tolerated                        the procedure well. Findings:      The examined esophagus was normal.      Evidence of a Roux-en-Y gastrojejunostomy was found. The gastrojejunal       anastomosis was characterized by healthy appearing mucosa. This was       traversed. The pouch-to-jejunum limb was characterized by healthy       appearing mucosa. The jejunojejunal anastomosis was characterized by       healthy appearing mucosa.      The examined jejunum was normal.      A TTS dilator was passed through the scope. Dilation with a 15-16.5-18       mm anastomotic balloon dilator was performed at the anastomosis. Impression:           - Normal esophagus.                       - Roux-en-Y gastrojejunostomy with gastrojejunal                        anastomosis characterized by healthy appearing mucosa.                       - Normal examined jejunum.                       - Dilation performed at the anastomosis.                       - No specimens collected. Recommendation:       - Discharge patient to home.                       - Resume previous diet.                       - Continue present medications. Procedure Code(s):    --- Professional ---                       [phone], Esophagogastroduodenoscopy, flexible, transoral;                        with dilation of gastric/duodenal stricture(s) (eg,                        balloon, bougie) Diagnosis Code(s):    --- Professional ---  Z98.0, Intestinal bypass and anastomosis status                       K91.89, Other postprocedural complications and                        disorders of digestive system CPT copyright 2017 American Medical Association. All rights reserved. The codes documented in this report are preliminary and upon  coder review may  be revised to meet current compliance requirements. [name] MD, MD 02/06/2018 2:01:33 PM This report has been signed electronically. Number of Addenda: 0 Note Initiated On: 02/06/2018 1:42 PM      [facility]

## 2018-02-06 NOTE — Anesthesia Preprocedure Evaluation (Signed)
Anesthesia Evaluation  Patient identified by MRN, date of birth, ID band Patient awake    Reviewed: Allergy & Precautions, H&P , NPO status , Patient's Chart, lab work & pertinent test results, reviewed documented beta blocker date and time   Airway Mallampati: III  TM Distance: >3 FB Neck ROM: full    Dental no notable dental hx.    Pulmonary former smoker,    Pulmonary exam normal breath sounds clear to auscultation       Cardiovascular Exercise Tolerance: Good hypertension, negative cardio ROS Normal cardiovascular exam Rhythm:regular Rate:Normal     Neuro/Psych Anxiety Depression negative neurological ROS     GI/Hepatic Neg liver ROS, GERD  ,S/p gastric bypass    Endo/Other  negative endocrine ROS  Renal/GU negative Renal ROS  negative genitourinary   Musculoskeletal   Abdominal   Peds  Hematology negative hematology ROS (+)   Anesthesia Other Findings   Reproductive/Obstetrics negative OB ROS                             Anesthesia Physical Anesthesia Plan  ASA: II  Anesthesia Plan: General   Post-op Pain Management:    Induction:   PONV Risk Score and Plan:   Airway Management Planned:   Additional Equipment:   Intra-op Plan:   Post-operative Plan:   Informed Consent: I have reviewed the patients History and Physical, chart, labs and discussed the procedure including the risks, benefits and alternatives for the proposed anesthesia with the patient or authorized representative who has indicated his/her understanding and acceptance.   Dental Advisory Given  Plan Discussed with: CRNA  Anesthesia Plan Comments:         Anesthesia Quick Evaluation

## 2018-02-06 NOTE — Transfer of Care (Signed)
Immediate Anesthesia Transfer of Care Note  Patient: Misty Zamora  Procedure(s) Performed: ESOPHAGOGASTRODUODENOSCOPY (EGD) WITH PROPOFOL with dilation (N/A Throat)  Patient Location: PACU  Anesthesia Type: General  Level of Consciousness: awake, alert  and patient cooperative  Airway and Oxygen Therapy: Patient Spontanous Breathing and Patient connected to supplemental oxygen  Post-op Assessment: Post-op Vital signs reviewed, Patient's Cardiovascular Status Stable, Respiratory Function Stable, Patent Airway and No signs of Nausea or vomiting  Post-op Vital Signs: Reviewed and stable  Complications: No apparent anesthesia complications

## 2018-02-06 NOTE — Anesthesia Procedure Notes (Signed)
Performed by: Donterius Filley, CRNA Pre-anesthesia Checklist: Patient identified, Emergency Drugs available, Suction available, Timeout performed and Patient being monitored Patient Re-evaluated:Patient Re-evaluated prior to induction Oxygen Delivery Method: Nasal cannula Placement Confirmation: positive ETCO2       

## 2018-02-06 NOTE — Anesthesia Postprocedure Evaluation (Signed)
Anesthesia Post Note  Patient: Misty Zamora  Procedure(s) Performed: ESOPHAGOGASTRODUODENOSCOPY (EGD) WITH PROPOFOL with dilation (N/A Throat)  Patient location during evaluation: PACU Anesthesia Type: General Level of consciousness: awake and alert Pain management: pain level controlled Vital Signs Assessment: post-procedure vital signs reviewed and stable Respiratory status: spontaneous breathing, nonlabored ventilation, respiratory function stable and patient connected to nasal cannula oxygen Cardiovascular status: blood pressure returned to baseline and stable Postop Assessment: no apparent nausea or vomiting Anesthetic complications: no    Trecia Rogers

## 2018-02-06 NOTE — H&P (Signed)
[name], MD [facility] [address] [place], [place] [address] Phone:[phone] Fax : [phone]  Primary Care Physician:  Idelle Crouch, MD Primary Gastroenterologist:  Dr. Allen Norris  Pre-Procedure History & Physical: HPI:  Misty Zamora is a 42 y.o. female is here for an endoscopy.   Past Medical History:  Diagnosis Date  . Anemia    had iron infusions. last one in Sept. told levels are now normal  . Anxiety   . 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 Bulging lumbar disc    causes bilateral leg pain  . Depression   . Family history of adverse reaction to anesthesia    Father - PONV  . GERD (gastroesophageal reflux disease)    occasional  . Heart murmur 2005  . History of cardiovascular stress test 2005   showed MR and TR  . History of kidney stones   . Hypertension    no meds since gastric bypass  . IDA (iron deficiency anemia)   . PONV (postoperative nausea and vomiting)    hypotension with epidural  . Sleep apnea    was better after gastric bypass, hasd recently started snoring again.  . Wears contact lenses     Past Surgical History:  Procedure Laterality Date  . ABDOMINAL HYSTERECTOMY  12/2014   ovaries in situ  . BALLOON DILATION  08/29/2017   Procedure: BALLOON DILATION;  Surgeon: [name], MD;  Location: [place];  Service: Endoscopy;;  . BILATERAL SALPINGECTOMY Bilateral 12/27/2014   Procedure: BILATERAL SALPINGECTOMY;  Surgeon: Will Bonnet, MD;  Location: [facility];  Service: Gynecology;  Laterality: Bilateral;  . COLON SURGERY  08/01/2017   Resection and revision of Bypass - [facility], Dr Duke Salvia  . COLONOSCOPY  2010  . CYSTOSCOPY N/A 12/27/2014   Procedure: CYSTOSCOPY;  Surgeon: Will Bonnet, MD;  Location: [facility];  Service: Gynecology;  Laterality: N/A;  . DILATION AND CURETTAGE OF UTERUS    . ELBOW ARTHROSCOPY Left 03/30/2015   Procedure: ARTHROSCOPY ELBOW WITH DEBRIDEMENT OF THE SYMPTOMATIC PLICA;   Surgeon: Corky Mull, MD;  Location: [place];  Service: Orthopedics;  Laterality: Left;  . ELBOW SURGERY Left   . ERCP    . ESOPHAGOGASTRODUODENOSCOPY (EGD) WITH PROPOFOL N/A 06/06/2016   Procedure: ESOPHAGOGASTRODUODENOSCOPY (EGD) WITH PROPOFOL;  Surgeon: [name], MD;  Location: [place];  Service: Endoscopy;  Laterality: N/A;  . ESOPHAGOGASTRODUODENOSCOPY (EGD) WITH PROPOFOL N/A 08/29/2017   Procedure: ESOPHAGOGASTRODUODENOSCOPY (EGD) WITH PROPOFOL;  Surgeon: [name], MD;  Location: [place];  Service: Endoscopy;  Laterality: N/A;  . ESOPHAGOGASTRODUODENOSCOPY (EGD) WITH PROPOFOL N/A 01/08/2018   Procedure: ESOPHAGOGASTRODUODENOSCOPY (EGD) WITH PROPOFOL;  Surgeon: Lin Landsman, MD;  Location: [facility] END[facility];  Service: Gastroenterology;  Laterality: N/A;  . gallbladder removed    . GASTRIC BYPASS  2012  . LAPAROSCOPIC HYSTERECTOMY N/A 12/27/2014   Procedure: HYSTERECTOMY TOTAL LAPAROSCOPIC;  Surgeon: Will Bonnet, MD;  Location: [facility];  Service: Gynecology;  Laterality: N/A;  . laproscopy    . RENAL ENDOSCOPY VIA NEPHROSTOMY / PYELOSTOMY  2010  . TUBAL LIGATION      Prior to Admission medications   Medication Sig Start Date End Date Taking? Authorizing Provider  clonazePAM (KLONOPIN) 0.5 MG tablet TAKE 1 TABLET BY MOUTH TWICE A DAY AS NEEDED FOR ANXIETY 09/15/17  Yes Carmon Ginsberg, PA  hydrOXYzine (ATARAX/VISTARIL) 25 MG tablet One or two every 6 hours as needed for anxiety or sleep  12/01/17  Yes Carmon Ginsberg, PA  Multiple Vitamins-Minerals (BARIATRIC MULTIVITAMINS/IRON PO) Take by mouth daily.   Yes [provider]  Oxycodone HCl 10 MG TABS One pill every 4 hours as needed for pain. Not to exceed 4 pills in a 24 hour period. 01/19/18  Yes Carmon Ginsberg, PA  predniSONE (DELTASONE) 20 MG tablet Taper as follows: 3 pills for 4 days, two pills for 4 days, one pill for four days 02/04/18  Yes Chauvin, Herbie Baltimore, PA  Vitamin D,  Ergocalciferol, (DRISDOL) 50000 units CAPS capsule TAKE ONE CAPSULE BY MOUTH ONE TIME PER WEEK 12/10/17  Yes [provider]    Allergies as of 01/08/2018 - Review Complete 01/08/2018  Allergen Reaction Noted  . Compazine [prochlorperazine edisylate] Other (See Comments) 01/02/2016  . Ketorolac Palpitations 12/21/2015    Family History  Problem Relation Age of Onset  . Hypertension Mother   . Hyperlipidemia Mother   . Diabetes Mother   . Cancer Mother        breast cancer; 04/2011  . Colon polyps Mother   . Breast cancer Mother 68  . Hyperlipidemia Father   . Hypertension Father   . Colon polyps Father   . Hypertension Sister   . Hypertension Brother   . Cancer Maternal Uncle        terminal lung cancer  . Breast cancer Paternal Aunt        60's  . Breast cancer Paternal Grandmother   . Bladder Cancer Neg Hx   . Kidney cancer Neg Hx     Social History   Socioeconomic History  . Marital status: Single    Spouse name: Not on file  . Number of children: Not on file  . Years of education: Not on file  . Highest education level: Not on file  Occupational History    Comment: Full Time  Social Needs  . Financial resource strain: Not on file  . Food insecurity:    Worry: Not on file    Inability: Not on file  . Transportation needs:    Medical: Not on file    Non-medical: Not on file  Tobacco Use  . Smoking status: Former Smoker    Packs/day: 0.00    Years: 8.00    Pack years: 0.00    Types: Cigarettes    Last attempt to quit: 06/04/1999    Years since quitting: 18.6  . Smokeless tobacco: Never Used  Substance and Sexual Activity  . Alcohol use: No    Comment: 1 drink every few months  . Drug use: No  . Sexual activity: Not on file  Lifestyle  . Physical activity:    Days per week: Not on file    Minutes per session: Not on file  . Stress: Not on file  Relationships  . Social connections:    Talks on phone: Not on file    Gets together: Not on  file    Attends religious service: Not on file    Active member of club or organization: Not on file    Attends meetings of clubs or organizations: Not on file    Relationship status: Not on file  . Intimate partner violence:    Fear of current or ex partner: Not on file    Emotionally abused: Not on file    Physically abused: Not on file    Forced sexual activity: Not on file  Other Topics Concern  . Not on file  Social History Narrative  No regular exercise          Review of Systems: See HPI, otherwise negative ROS  Physical Exam: BP 123/73   Pulse 73   Temp 97.7 F (36.5 C) (Temporal)   Resp 16   Ht 5\' 9"  (1.753 m)   Wt 88.9 kg   LMP 12/14/2014 (Exact Date)   SpO2 100%   BMI 28.94 kg/m  General:   Alert,  pleasant and cooperative in NAD Head:  Normocephalic and atraumatic. Neck:  Supple; no masses or thyromegaly. Lungs:  Clear throughout to auscultation.    Heart:  Regular rate and rhythm. Abdomen:  Soft, nontender and nondistended. Normal bowel sounds, without guarding, and without rebound.   Neurologic:  Alert and  oriented x4;  grossly normal neurologically.  Impression/Plan: DELANY STEURY is here for an endoscopy to be performed for peptic stricture  Risks, benefits, limitations, and alternatives regarding  endoscopy have been reviewed with the patient.  Questions have been answered.  All parties agreeable.   [name], MD  02/06/2018, 1:41 PM

## 2018-02-09 ENCOUNTER — Encounter: Payer: Self-pay | Admitting: Student in an Organized Health Care Education/Training Program

## 2018-02-09 ENCOUNTER — Telehealth: Payer: Self-pay | Admitting: *Deleted

## 2018-02-09 MED ORDER — PREGABALIN 25 MG PO CAPS: 25.0000 mg | ORAL_CAPSULE | Freq: Three times a day (TID) | ORAL | 1 refills | Status: AC

## 2018-02-09 NOTE — Telephone Encounter (Signed)
Spoke with patient on the phone explaining to her that she would not be a candidate for opioid therapy at our clinic given her concomitant use of Klonopin.  I explained to her the CDC guidelines as well as increased risk of respiratory depression with concomitant opioid and benzodiazepine therapy.  Patient is complaining of worsening lower extremity pain.  I will prescribe her Lyrica 25 mg 3 times daily to help out with her neuropathic pain.

## 2018-02-09 NOTE — Telephone Encounter (Signed)
Spoke with patient on the phone explaining to her that she would not be a candidate for opioid therapy at our clinic given her concomitant use of Klonopin.  I explained to her the CDC guidelines as well as increased risk of respiratory depression with concomitant opioid and benzodiazepine therapy.  Patient is complaining of worsening lower extremity pain.  I will prescribe her Lyrica 25 mg 3 times daily to help out with her neuropathic pain.  Requested Prescriptions   Signed Prescriptions Disp Refills  . pregabalin (LYRICA) 25 MG capsule 90 capsule 1    Sig: Take 1 capsule (25 mg total) by mouth 3 (three) times daily.    Authorizing Provider: Gillis Santa

## 2018-02-10 ENCOUNTER — Other Ambulatory Visit: Payer: Self-pay | Admitting: Family Medicine

## 2018-02-10 ENCOUNTER — Encounter: Payer: Self-pay | Admitting: Family Medicine

## 2018-02-10 ENCOUNTER — Encounter: Payer: Self-pay | Admitting: Student in an Organized Health Care Education/Training Program

## 2018-02-10 DIAGNOSIS — M5116 Intervertebral disc disorders with radiculopathy, lumbar region: Secondary | ICD-10-CM

## 2018-02-10 DIAGNOSIS — M5136 Other intervertebral disc degeneration, lumbar region: Secondary | ICD-10-CM

## 2018-02-11 ENCOUNTER — Encounter: Payer: Self-pay | Admitting: Student in an Organized Health Care Education/Training Program
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
## 2018-02-13 ENCOUNTER — Telehealth: Payer: Self-pay

## 2018-02-13 NOTE — Telephone Encounter (Signed)
Spoke with the patient to confirm appointment time and date for a new patient appointment. The patient states no she will not be coming to the appointment that she do not need any treatment at this time and she will call and reschedule if she need any service. I explained to her that she was a referral from a different office for anemia and Dr Janese Banks would like to see her the patient was not agreeable to come in for a visit. The patient still refused to come in for the appointment.

## 2018-02-16 ENCOUNTER — Other Ambulatory Visit: Payer: Self-pay | Admitting: Family Medicine

## 2018-02-16 ENCOUNTER — Encounter: Payer: Self-pay | Admitting: Family Medicine

## 2018-02-16 DIAGNOSIS — M5136 Other intervertebral disc degeneration, lumbar region: Secondary | ICD-10-CM

## 2018-02-16 DIAGNOSIS — M5116 Intervertebral disc disorders with radiculopathy, lumbar region: Secondary | ICD-10-CM

## 2018-02-17 ENCOUNTER — Other Ambulatory Visit: Payer: Self-pay | Admitting: Family Medicine

## 2018-02-17 ENCOUNTER — Telehealth: Payer: Self-pay | Admitting: Family Medicine

## 2018-02-17 MED ORDER — OXYCODONE HCL 10 MG PO TABS: ORAL_TABLET | ORAL | 0 refills | Status: AC

## 2018-02-17 NOTE — Telephone Encounter (Signed)
Patient needs refill on Oxycodone 10 mg. Sent to [facility] on [address]

## 2018-02-19 ENCOUNTER — Encounter: Payer: 59 | Admitting: Oncology

## 2018-03-05 ENCOUNTER — Encounter: Payer: 59 | Admitting: Psychology

## 2018-03-06 ENCOUNTER — Other Ambulatory Visit: Payer: Self-pay | Admitting: Family Medicine

## 2018-03-06 DIAGNOSIS — F419 Anxiety disorder, unspecified: Secondary | ICD-10-CM

## 2018-03-19 ENCOUNTER — Encounter: Payer: 59 | Admitting: Psychology

## 2018-03-26 ENCOUNTER — Encounter

## 2018-03-26 ENCOUNTER — Ambulatory Visit: Payer: 59 | Admitting: Psychology

## 2018-04-08 ENCOUNTER — Ambulatory Visit: Payer: Self-pay | Admitting: General Surgery

## 2018-04-13 ENCOUNTER — Emergency Department
Admission: EM | Admit: 2018-04-13 | Discharge: 2018-04-13 | Disposition: A | Discharge status: Home / Self Care | Payer: 59 | Attending: Emergency Medicine | Admitting: Emergency Medicine

## 2018-04-13 ENCOUNTER — Encounter: Payer: Self-pay | Admitting: Emergency Medicine

## 2018-04-13 ENCOUNTER — Other Ambulatory Visit: Payer: Self-pay

## 2018-04-13 ENCOUNTER — Emergency Department: Payer: 59

## 2018-04-13 DIAGNOSIS — Z980 Intestinal bypass and anastomosis status: Secondary | ICD-10-CM | POA: Insufficient documentation

## 2018-04-13 DIAGNOSIS — S301XXA Contusion of abdominal wall, initial encounter: Secondary | ICD-10-CM | POA: Diagnosis not present

## 2018-04-13 DIAGNOSIS — Y9389 Activity, other specified: Secondary | ICD-10-CM | POA: Insufficient documentation

## 2018-04-13 DIAGNOSIS — Z9884 Bariatric surgery status: Secondary | ICD-10-CM | POA: Diagnosis not present

## 2018-04-13 DIAGNOSIS — Z87891 Personal history of nicotine dependence: Secondary | ICD-10-CM | POA: Insufficient documentation

## 2018-04-13 DIAGNOSIS — Y999 Unspecified external cause status: Secondary | ICD-10-CM | POA: Insufficient documentation

## 2018-04-13 DIAGNOSIS — I10 Essential (primary) hypertension: Secondary | ICD-10-CM | POA: Insufficient documentation

## 2018-04-13 DIAGNOSIS — Y92415 Exit ramp or entrance ramp of street or highway as the place of occurrence of the external cause: Secondary | ICD-10-CM | POA: Insufficient documentation

## 2018-04-13 DIAGNOSIS — S299XXA Unspecified injury of thorax, initial encounter: Secondary | ICD-10-CM | POA: Diagnosis present

## 2018-04-13 DIAGNOSIS — M542 Cervicalgia: Secondary | ICD-10-CM | POA: Insufficient documentation

## 2018-04-13 DIAGNOSIS — Z79899 Other long term (current) drug therapy: Secondary | ICD-10-CM | POA: Diagnosis not present

## 2018-04-13 DIAGNOSIS — S20219A Contusion of unspecified front wall of thorax, initial encounter: Secondary | ICD-10-CM | POA: Diagnosis not present

## 2018-04-13 DIAGNOSIS — G44319 Acute post-traumatic headache, not intractable: Secondary | ICD-10-CM | POA: Diagnosis not present

## 2018-04-13 LAB — CBC WITH DIFFERENTIAL/PLATELET
Abs Immature Granulocytes: 0.01 10*3/uL (ref 0.00–0.07)
BASOS ABS: 0.1 10*3/uL (ref 0.0–0.1)
BASOS PCT: 1 %
EOS ABS: 0.2 10*3/uL (ref 0.0–0.5)
EOS PCT: 3 %
HEMATOCRIT: 35.9 % — AB (ref 36.0–46.0)
Hemoglobin: 11.6 g/dL — ABNORMAL LOW (ref 12.0–15.0)
Immature Granulocytes: 0 %
LYMPHS ABS: 2.2 10*3/uL (ref 0.7–4.0)
Lymphocytes Relative: 38 %
MCH: 28.9 pg (ref 26.0–34.0)
MCHC: 32.3 g/dL (ref 30.0–36.0)
MCV: 89.5 fL (ref 80.0–100.0)
Monocytes Absolute: 0.4 10*3/uL (ref 0.1–1.0)
Monocytes Relative: 7 %
NRBC: 0 % (ref 0.0–0.2)
Neutro Abs: 3 10*3/uL (ref 1.7–7.7)
Neutrophils Relative %: 51 %
Platelets: 231 10*3/uL (ref 150–400)
RBC: 4.01 MIL/uL (ref 3.87–5.11)
RDW: 13 % (ref 11.5–15.5)
WBC: 5.8 10*3/uL (ref 4.0–10.5)

## 2018-04-13 LAB — COMPREHENSIVE METABOLIC PANEL
ALT: 16 U/L (ref 0–44)
AST: 18 U/L (ref 15–41)
Albumin: 3.6 g/dL (ref 3.5–5.0)
Alkaline Phosphatase: 48 U/L (ref 38–126)
Anion gap: 10 (ref 5–15)
BILIRUBIN TOTAL: 0.4 mg/dL (ref 0.3–1.2)
BUN: 6 mg/dL (ref 6–20)
CALCIUM: 8.3 mg/dL — AB (ref 8.9–10.3)
CO2: 28 mmol/L (ref 22–32)
CREATININE: 0.74 mg/dL (ref 0.44–1.00)
Chloride: 102 mmol/L (ref 98–111)
GFR calc Af Amer: >60 mL/min (ref >60)
Glucose, Bld: 89 mg/dL (ref 70–99)
Potassium: 3.9 mmol/L (ref 3.5–5.1)
Sodium: 140 mmol/L (ref 135–145)
TOTAL PROTEIN: 6.5 g/dL (ref 6.5–8.1)

## 2018-04-13 MED ORDER — METHOCARBAMOL 500 MG PO TABS: 500.0000 mg | ORAL_TABLET | Freq: Four times a day (QID) | ORAL | 0 refills | Status: AC

## 2018-04-13 MED ORDER — ONDANSETRON 8 MG PO TBDP
8.0000 mg | ORAL_TABLET | Freq: Once | ORAL | Status: AC
  Administered 2018-04-13: 8 mg via ORAL
  Filled 2018-04-13: qty 1

## 2018-04-13 MED ORDER — SODIUM CHLORIDE 0.9 % IV BOLUS
1000.0000 mL | Freq: Once | INTRAVENOUS | Status: AC
  Administered 2018-04-13: 1000 mL via INTRAVENOUS

## 2018-04-13 MED ORDER — OXYCODONE-ACETAMINOPHEN 5-325 MG PO TABS
1.0000 | ORAL_TABLET | Freq: Once | ORAL | Status: AC
  Administered 2018-04-13: 1 via ORAL
  Filled 2018-04-13: qty 1

## 2018-04-13 MED ORDER — IOPAMIDOL (ISOVUE-300) INJECTION 61%
100.0000 mL | Freq: Once | INTRAVENOUS | Status: AC | PRN
  Administered 2018-04-13: 100 mL via INTRAVENOUS
  Filled 2018-04-13: qty 100

## 2018-04-13 MED ORDER — MELOXICAM 15 MG PO TABS: 15.0000 mg | ORAL_TABLET | Freq: Every day | ORAL | 0 refills | Status: AC

## 2018-04-13 NOTE — ED Notes (Signed)
Pt discharged home after verbalizing understanding of discharge instructions; nad noted. 
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
## 2018-05-05 ENCOUNTER — Ambulatory Visit: Payer: 59 | Admitting: Psychology

## 2018-05-08 ENCOUNTER — Encounter: Payer: 59 | Admitting: Psychology

## 2018-06-09 ENCOUNTER — Encounter: Payer: Self-pay | Admitting: *Deleted

## 2018-06-22 ENCOUNTER — Other Ambulatory Visit: Payer: Self-pay | Admitting: Internal Medicine

## 2018-06-22 DIAGNOSIS — Z1231 Encounter for screening mammogram for malignant neoplasm of breast: Secondary | ICD-10-CM

## 2018-06-23 ENCOUNTER — Inpatient Hospital Stay: Admission: RE | Admit: 2018-06-23 | Payer: 59 | Source: Ambulatory Visit

## 2018-07-21 ENCOUNTER — Ambulatory Visit: Payer: Managed Care, Other (non HMO) | Admitting: Gastroenterology

## 2018-10-07 IMAGING — CR DG CHEST 2V
1 series · 3 of 3 positions shown · non-contrast
Comparison: 06/26/2015

CLINICAL DATA: Cough for few days

EXAM:
CHEST  2 VIEW

[Series 1: w chest pa · 0.14mm/px · 3 of 3 slices shown]
[im 1/3]
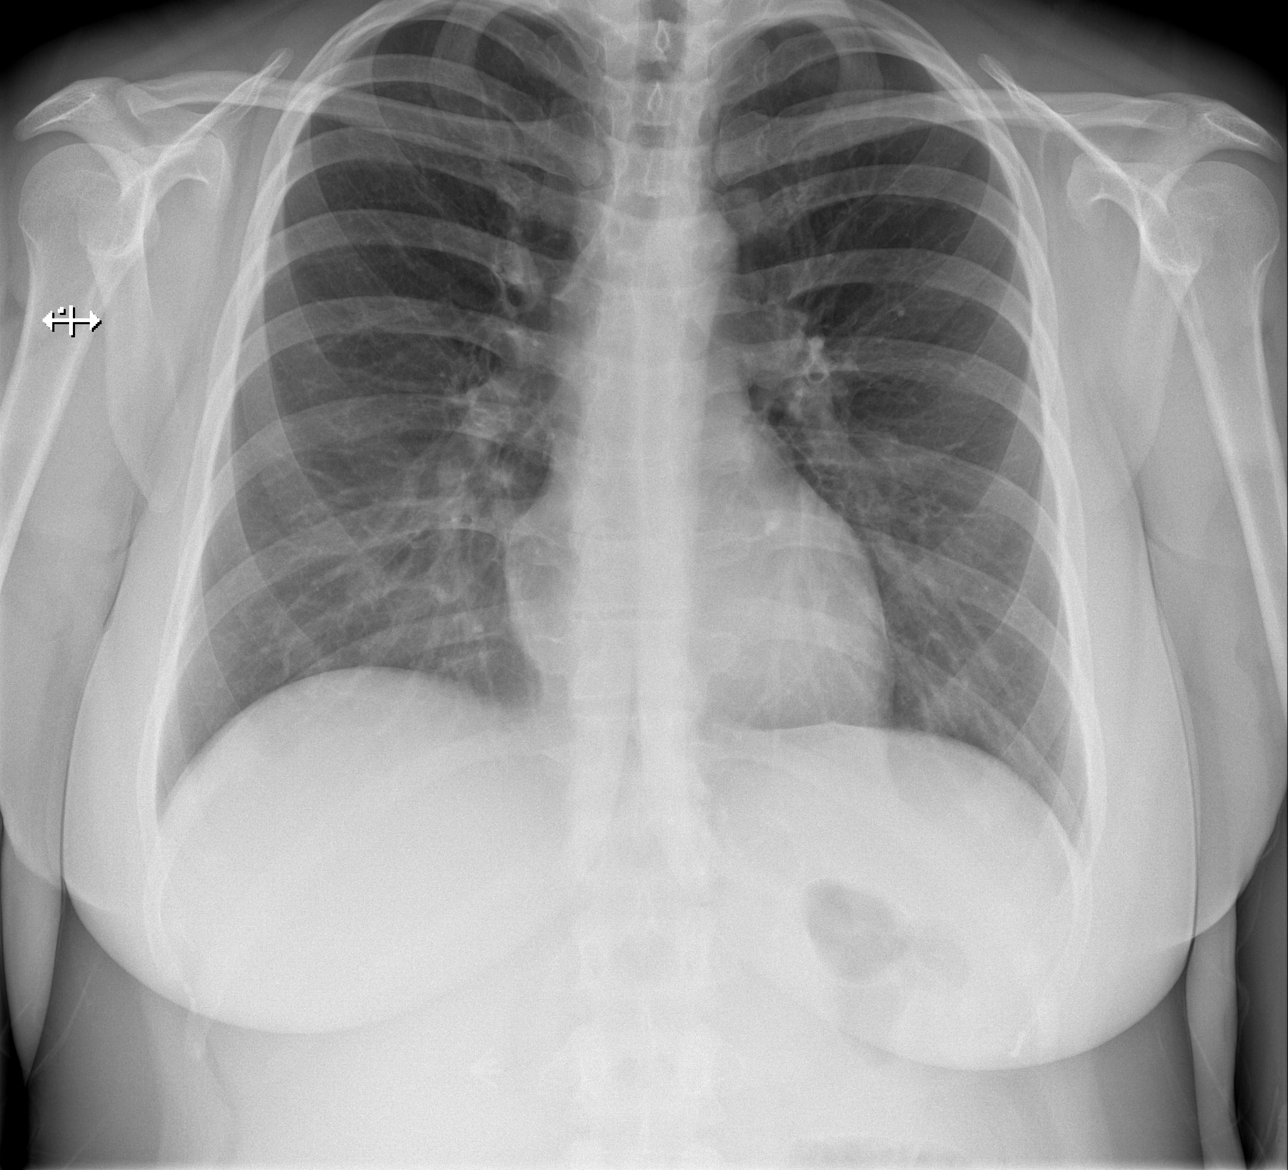
[im 2/3]
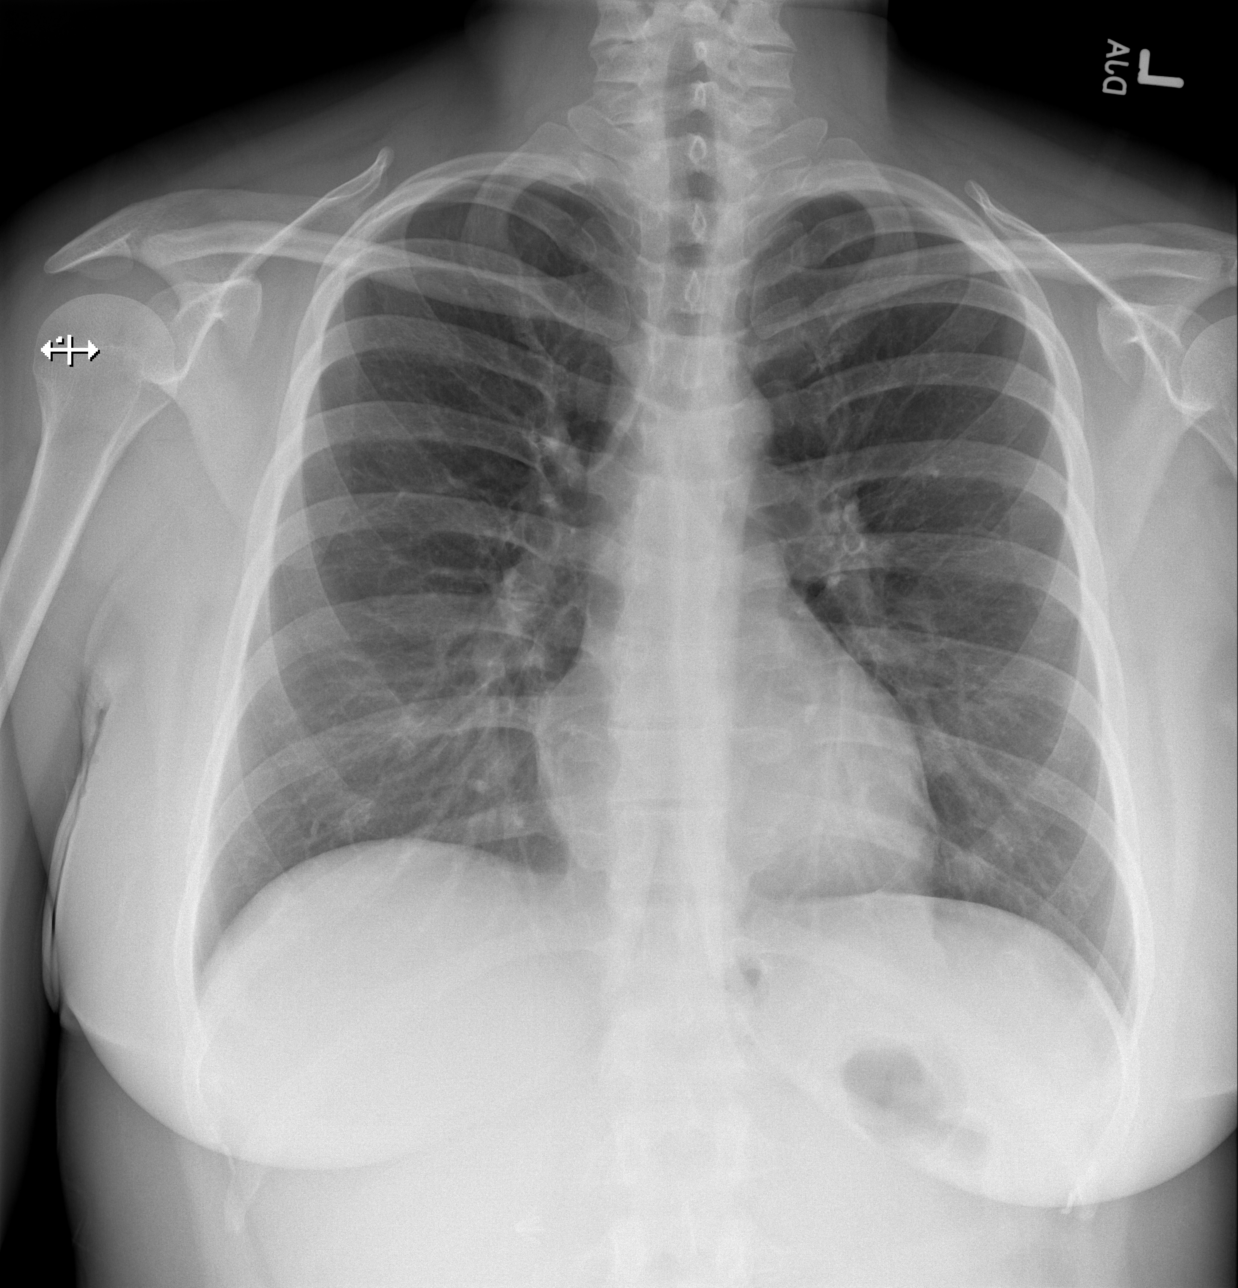
[im 3/3]
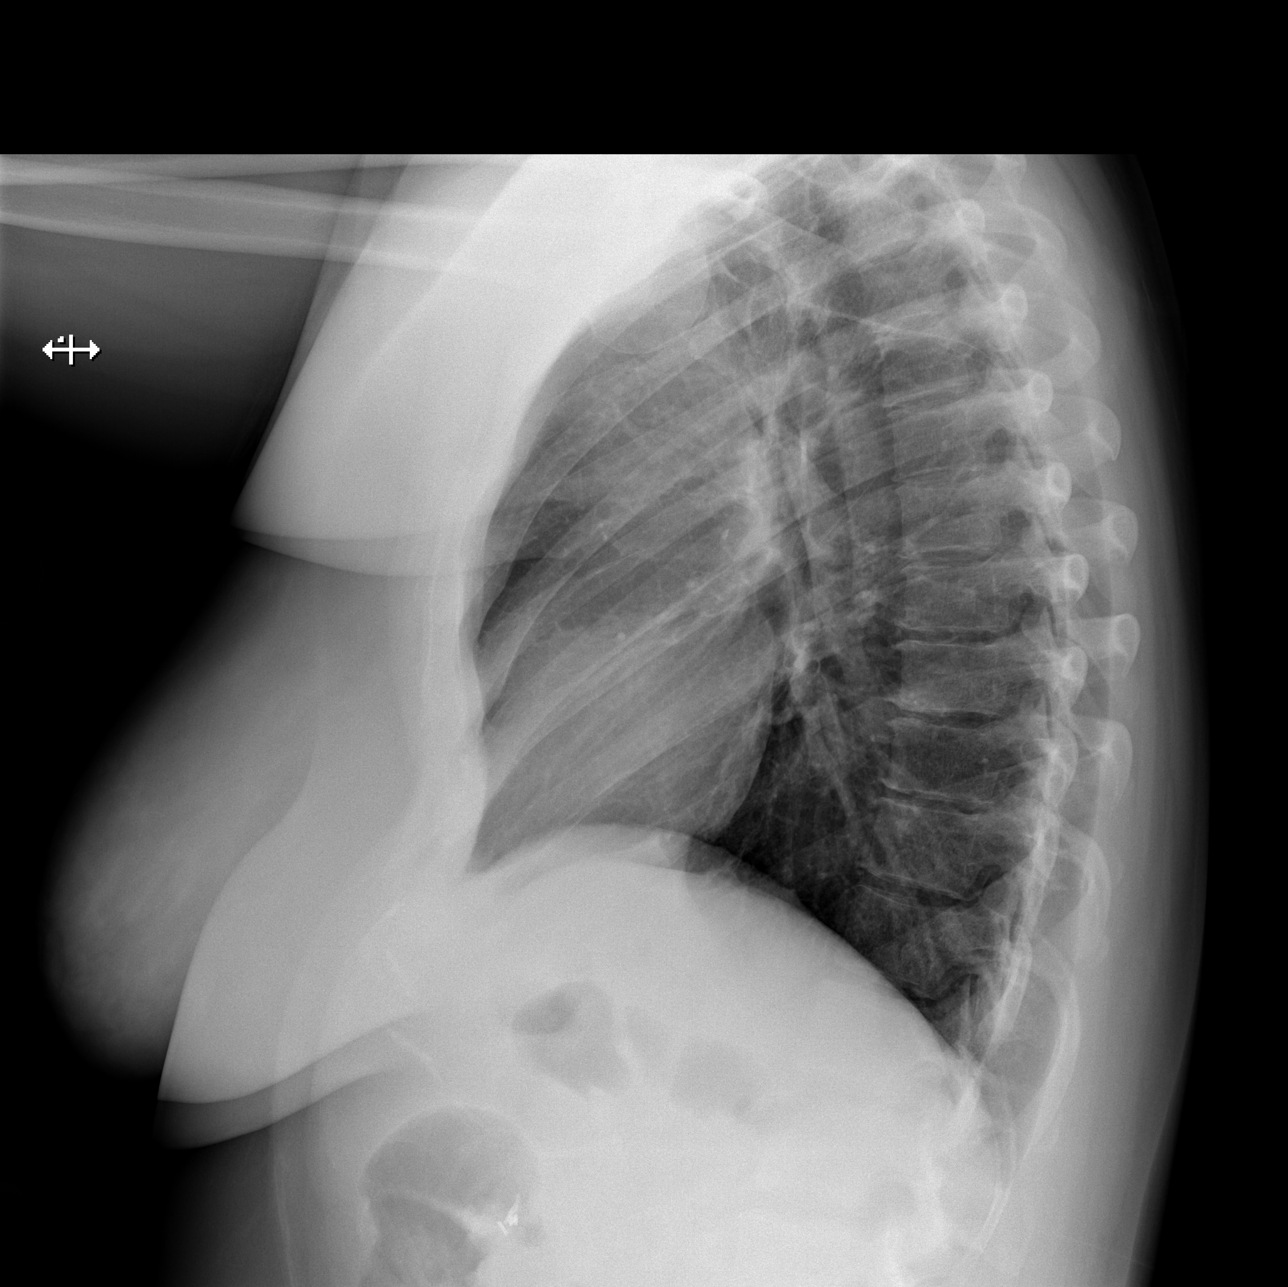

[3 of 3 positions shown; findings below may reference images not displayed]

FINDINGS: The heart size and mediastinal contours are within normal limits.
Both lungs are clear. The visualized skeletal structures are
unremarkable.
IMPRESSION: No active cardiopulmonary disease.

## 2018-12-24 ENCOUNTER — Ambulatory Visit: Payer: Self-pay | Admitting: Obstetrics and Gynecology

## 2019-02-05 ENCOUNTER — Other Ambulatory Visit: Payer: Self-pay | Admitting: *Deleted

## 2019-02-05 DIAGNOSIS — Z20822 Contact with and (suspected) exposure to covid-19: Secondary | ICD-10-CM

## 2019-02-06 LAB — NOVEL CORONAVIRUS, NAA: SARS-CoV-2, NAA: NOT DETECTED

## 2019-02-23 ENCOUNTER — Other Ambulatory Visit: Payer: Self-pay

## 2019-02-23 ENCOUNTER — Encounter: Payer: Self-pay | Admitting: Emergency Medicine

## 2019-02-23 ENCOUNTER — Emergency Department: Payer: Managed Care, Other (non HMO)

## 2019-02-23 ENCOUNTER — Emergency Department
Admission: EM | Admit: 2019-02-23 | Discharge: 2019-02-23 | Disposition: A | Discharge status: Home / Self Care | Payer: Managed Care, Other (non HMO) | Attending: Student in an Organized Health Care Education/Training Program | Admitting: Student in an Organized Health Care Education/Training Program

## 2019-02-23 DIAGNOSIS — Z20828 Contact with and (suspected) exposure to other viral communicable diseases: Secondary | ICD-10-CM | POA: Diagnosis not present

## 2019-02-23 DIAGNOSIS — R51 Headache: Secondary | ICD-10-CM | POA: Insufficient documentation

## 2019-02-23 DIAGNOSIS — Z79899 Other long term (current) drug therapy: Secondary | ICD-10-CM | POA: Diagnosis not present

## 2019-02-23 DIAGNOSIS — R197 Diarrhea, unspecified: Secondary | ICD-10-CM | POA: Insufficient documentation

## 2019-02-23 DIAGNOSIS — R0981 Nasal congestion: Secondary | ICD-10-CM

## 2019-02-23 DIAGNOSIS — R112 Nausea with vomiting, unspecified: Secondary | ICD-10-CM | POA: Diagnosis present

## 2019-02-23 DIAGNOSIS — Z87891 Personal history of nicotine dependence: Secondary | ICD-10-CM | POA: Diagnosis not present

## 2019-02-23 DIAGNOSIS — I10 Essential (primary) hypertension: Secondary | ICD-10-CM | POA: Insufficient documentation

## 2019-02-23 DIAGNOSIS — J111 Influenza due to unidentified influenza virus with other respiratory manifestations: Secondary | ICD-10-CM | POA: Insufficient documentation

## 2019-02-23 LAB — CBC WITH DIFFERENTIAL/PLATELET
Abs Immature Granulocytes: 0.01 10*3/uL (ref 0.00–0.07)
Basophils Absolute: 0 10*3/uL (ref 0.0–0.1)
Basophils Relative: 1 %
Eosinophils Absolute: 0.1 10*3/uL (ref 0.0–0.5)
Eosinophils Relative: 2 %
HCT: 33.1 % — ABNORMAL LOW (ref 36.0–46.0)
Hemoglobin: 10.2 g/dL — ABNORMAL LOW (ref 12.0–15.0)
Immature Granulocytes: 0 %
Lymphocytes Relative: 41 %
Lymphs Abs: 1.6 10*3/uL (ref 0.7–4.0)
MCH: 24.3 pg — ABNORMAL LOW (ref 26.0–34.0)
MCHC: 30.8 g/dL (ref 30.0–36.0)
MCV: 79 fL — ABNORMAL LOW (ref 80.0–100.0)
Monocytes Absolute: 0.2 10*3/uL (ref 0.1–1.0)
Monocytes Relative: 6 %
Neutro Abs: 1.9 10*3/uL (ref 1.7–7.7)
Neutrophils Relative %: 50 %
Platelets: 248 10*3/uL (ref 150–400)
RBC: 4.19 MIL/uL (ref 3.87–5.11)
RDW: 17.2 % — ABNORMAL HIGH (ref 11.5–15.5)
WBC: 3.9 10*3/uL — ABNORMAL LOW (ref 4.0–10.5)
nRBC: 0 % (ref 0.0–0.2)

## 2019-02-23 LAB — COMPREHENSIVE METABOLIC PANEL
ALT: 26 U/L (ref 0–44)
AST: 19 U/L (ref 15–41)
Albumin: 3.7 g/dL (ref 3.5–5.0)
Alkaline Phosphatase: 56 U/L (ref 38–126)
Anion gap: 8 (ref 5–15)
BUN: 6 mg/dL (ref 6–20)
CO2: 28 mmol/L (ref 22–32)
Calcium: 8.8 mg/dL — ABNORMAL LOW (ref 8.9–10.3)
Chloride: 102 mmol/L (ref 98–111)
Creatinine, Ser: 0.73 mg/dL (ref 0.44–1.00)
GFR calc Af Amer: >60 mL/min (ref >60)
GFR calc non Af Amer: >60 mL/min (ref >60)
Glucose, Bld: 92 mg/dL (ref 70–99)
Potassium: 3.8 mmol/L (ref 3.5–5.1)
Sodium: 138 mmol/L (ref 135–145)
Total Bilirubin: 0.5 mg/dL (ref 0.3–1.2)
Total Protein: 6.4 g/dL — ABNORMAL LOW (ref 6.5–8.1)

## 2019-02-23 LAB — SARS CORONAVIRUS 2 (TAT 6-24 HRS): SARS Coronavirus 2: NEGATIVE

## 2019-02-23 MED ORDER — ONDANSETRON HCL 4 MG PO TABS: 4.0000 mg | ORAL_TABLET | Freq: Every day | ORAL | 0 refills | Status: AC | PRN

## 2019-02-23 MED ORDER — ALBUTEROL SULFATE HFA 108 (90 BASE) MCG/ACT IN AERS: 2.0000 | INHALATION_SPRAY | Freq: Four times a day (QID) | RESPIRATORY_TRACT | 1 refills | Status: AC | PRN

## 2019-02-23 MED ORDER — ONDANSETRON 4 MG PO TBDP
4.0000 mg | ORAL_TABLET | Freq: Once | ORAL | Status: AC
  Administered 2019-02-23: 4 mg via ORAL
  Filled 2019-02-23: qty 1

## 2019-02-23 MED ORDER — ACETAMINOPHEN 500 MG PO TABS
1000.0000 mg | ORAL_TABLET | Freq: Once | ORAL | Status: AC
  Administered 2019-02-23: 1000 mg via ORAL
  Filled 2019-02-23: qty 2

## 2019-02-23 NOTE — ED Triage Notes (Signed)
PT arrives with complaints of n/v/d and headache. Pt reports diarrhea for the last 2 weeks, n/v for the last week, and headache for the last 2 days.

## 2019-02-23 NOTE — ED Provider Notes (Signed)
[facility] Emergency Department Provider Note    First MD Initiated Contact with Patient 02/23/19 1308     (approximate)  I have reviewed the triage vital signs and the nursing notes.   HISTORY  Chief Complaint Headache, Emesis, and Diarrhea    HPI Misty Zamora is a 43 y.o. female  the below listed past medical history presents the ER for several days of nausea vomiting loose diarrheal stool headache malaise and some cough and wheeze.  States she did use her husband's albuterol with improvement.  Has not had any measured fevers.  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  Has had multiple coworkers that were diagnosed with [place].  She denies any focal abdominal pain.  Denies any blood in her stool.  States that she has tried Imodium with some improvement.  Denies any history of cardiac or lung disease.   Past Medical History:  Diagnosis Date  . Anemia    had iron infusions. last one in Sept. told levels are now normal  . Anxiety   . 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 Bulging lumbar disc    causes bilateral leg pain  . Depression   . Family history of adverse reaction to anesthesia    Father - PONV  . GERD (gastroesophageal reflux disease)    occasional  . Heart murmur 2005  . History of cardiovascular stress test 2005   showed MR and TR  . History of kidney stones   . Hypertension    no meds since gastric bypass  . IDA (iron deficiency anemia)   . PONV (postoperative nausea and vomiting)    hypotension with epidural  . Sleep apnea    was better after gastric bypass, hasd recently started snoring again.  . Wears contact lenses    Family History  Problem Relation Age of Onset  . Hypertension Mother   . Hyperlipidemia Mother   . Diabetes Mother   . Cancer Mother        breast cancer; 04/2011  . Colon polyps Mother   . Breast cancer Mother 83  .  Hyperlipidemia Father   . Hypertension Father   . Colon polyps Father   . Hypertension Sister   . Hypertension Brother   . Cancer Maternal Uncle        terminal lung cancer  . Breast cancer Paternal Aunt        70's  . Breast cancer Paternal Grandmother 33  . Breast cancer Maternal Grandmother 55  . Ovarian cancer Other   . Bladder Cancer Neg Hx   . Kidney cancer Neg Hx    Past Surgical History:  Procedure Laterality Date  . ABDOMINAL HYSTERECTOMY  12/2014   ovaries in situ  . BALLOON DILATION  08/29/2017   Procedure: BALLOON DILATION;  Surgeon: [name], MD;  Location: [place];  Service: Endoscopy;;  . BILATERAL SALPINGECTOMY Bilateral 12/27/2014   Procedure: BILATERAL SALPINGECTOMY;  Surgeon: Will Bonnet, MD;  Location: [facility];  Service: Gynecology;  Laterality: Bilateral;  . COLON SURGERY  08/01/2017   Resection and revision of Bypass - [facility], Dr Duke Salvia  . COLONOSCOPY  2010  . CYSTOSCOPY N/A 12/27/2014   Procedure: CYSTOSCOPY;  Surgeon: Will Bonnet, MD;  Location: [facility];  Service: Gynecology;  Laterality: N/A;  . DILATION AND CURETTAGE OF UTERUS    . ELBOW ARTHROSCOPY Left  03/30/2015   Procedure: ARTHROSCOPY ELBOW WITH DEBRIDEMENT OF THE SYMPTOMATIC PLICA;  Surgeon: Corky Mull, MD;  Location: [place];  Service: Orthopedics;  Laterality: Left;  . ELBOW SURGERY Left   . ERCP    . ESOPHAGOGASTRODUODENOSCOPY (EGD) WITH PROPOFOL N/A 06/06/2016   Procedure: ESOPHAGOGASTRODUODENOSCOPY (EGD) WITH PROPOFOL;  Surgeon: [name], MD;  Location: [place];  Service: Endoscopy;  Laterality: N/A;  . ESOPHAGOGASTRODUODENOSCOPY (EGD) WITH PROPOFOL N/A 08/29/2017   Procedure: ESOPHAGOGASTRODUODENOSCOPY (EGD) WITH PROPOFOL;  Surgeon: [name], MD;  Location: [place];  Service: Endoscopy;  Laterality: N/A;  . ESOPHAGOGASTRODUODENOSCOPY (EGD) WITH PROPOFOL N/A 01/08/2018   Procedure: ESOPHAGOGASTRODUODENOSCOPY (EGD) WITH  PROPOFOL;  Surgeon: Lin Landsman, MD;  Location: [facility];  Service: Gastroenterology;  Laterality: N/A;  . ESOPHAGOGASTRODUODENOSCOPY (EGD) WITH PROPOFOL N/A 02/06/2018   Procedure: ESOPHAGOGASTRODUODENOSCOPY (EGD) WITH PROPOFOL with dilation;  Surgeon: [name], MD;  Location: [place];  Service: Endoscopy;  Laterality: N/A;  . gallbladder removed    . GASTRIC BYPASS  2012  . LAPAROSCOPIC HYSTERECTOMY N/A 12/27/2014   Procedure: HYSTERECTOMY TOTAL LAPAROSCOPIC;  Surgeon: Will Bonnet, MD;  Location: [facility];  Service: Gynecology;  Laterality: N/A;  . laproscopy    . RENAL ENDOSCOPY VIA NEPHROSTOMY / PYELOSTOMY  2010  . TUBAL LIGATION     Patient Active Problem List   Diagnosis Date Noted  . Intestinal bypass or anastomosis status   . Other postprocedural complications and disorders of digestive system   . Lumbar radiculopathy (L>R) L5, S1 01/22/2018  . History of Roux-en-Y gastric bypass   . Esophageal dysphagia   . Gastrojejunal anastomotic stricture   . Abdominal pain, epigastric   . Bariatric surgery status   . DDD (degenerative disc disease), lumbar 10/03/2015  . Neuritis or radiculitis due to rupture of lumbar intervertebral disc 06/07/2015  . Lumbar herniated disc 06/07/2015  . Allergic rhinitis 02/18/2015  . Anxiety 02/18/2015  . H/O renal calculi 02/18/2015  . 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 Multinodular goiter 01/02/2015  . Status post laparoscopic hysterectomy 12/27/2014  . Chronic female pelvic pain 12/08/2014  . IDA (iron deficiency anemia) 12/01/2014  . Abdominal pain, chronic, epigastric 01/29/2012  . Gastroesophageal reflux disease 10/19/2009      Prior to Admission medications   Medication Sig Start Date End Date Taking? Authorizing Provider  albuterol (VENTOLIN HFA) 108 (90 Base) MCG/ACT inhaler Inhale 2 puffs into the lungs every 6 (six) hours as needed for wheezing or shortness of breath. 02/23/19   Merlyn Lot, MD   clonazePAM (KLONOPIN) 0.5 MG tablet TAKE 1 TABLET BY MOUTH TWICE A DAY AS NEEDED FOR ANXIETY 09/15/17   Carmon Ginsberg, PA  hydrOXYzine (ATARAX/VISTARIL) 25 MG tablet One or two every 6 hours as needed for anxiety or sleep 12/01/17   Carmon Ginsberg, PA  meloxicam (MOBIC) 15 MG tablet Take 1 tablet (15 mg total) by mouth daily. 04/13/18   Cuthriell, Charline Bills, PA-C  methocarbamol (ROBAXIN) 500 MG tablet Take 1 tablet (500 mg total) by mouth 4 (four) times daily. 04/13/18   Cuthriell, Charline Bills, PA-C  Multiple Vitamins-Minerals (BARIATRIC MULTIVITAMINS/IRON PO) Take by mouth daily.    [provider]  ondansetron (ZOFRAN) 4 MG tablet Take 1 tablet (4 mg total) by mouth daily as needed. 02/23/19 02/23/20  Merlyn Lot, MD  Oxycodone HCl 10 MG TABS One pill every 6 hours as needed for pain. Not to exceed 4 pills in a 24 hour period. 02/17/18   Chauvin,  Herbie Baltimore, PA  predniSONE (DELTASONE) 20 MG tablet Taper as follows: 3 pills for 4 days, two pills for 4 days, one pill for four days 02/04/18   Carmon Ginsberg, PA  pregabalin (LYRICA) 25 MG capsule Take 1 capsule (25 mg total) by mouth 3 (three) times daily. 02/09/18   Gillis Santa, MD  Vitamin D, Ergocalciferol, (DRISDOL) 50000 units CAPS capsule TAKE ONE CAPSULE BY MOUTH ONE TIME PER WEEK 12/10/17   [provider]    Allergies Compazine [prochlorperazine edisylate] and Ketorolac    Social History Social History   Tobacco Use  . Smoking status: Former Smoker    Packs/day: 0.00    Years: 8.00    Pack years: 0.00    Types: Cigarettes    Quit date: 06/04/1999    Years since quitting: 19.7  . Smokeless tobacco: Never Used  Substance Use Topics  . Alcohol use: No    Comment: 1 drink every few months  . Drug use: No    Review of Systems Patient denies headaches, rhinorrhea, blurry vision, numbness, shortness of breath, chest pain, edema, cough, abdominal pain, nausea, vomiting, diarrhea, dysuria, fevers, rashes or  hallucinations unless otherwise stated above in HPI. ____________________________________________   PHYSICAL EXAM:  VITAL SIGNS: Vitals:   02/23/19 1203  BP: 131/76  Pulse: 81  Resp: 18  Temp: 98.6 F (37 C)  SpO2: 100%    Constitutional: Alert and oriented.  Eyes: Conjunctivae are normal.  Head: Atraumatic. Nose: No congestion/rhinnorhea. Mouth/Throat: Mucous membranes are moist.   Neck: No stridor. Painless ROM.  Cardiovascular: Normal rate, regular rhythm. Grossly normal heart sounds.  Good peripheral circulation. Respiratory: Normal respiratory effort.  No retractions. Lungs CTAB. Gastrointestinal: Soft and nontender. No distention. No abdominal bruits. No CVA tenderness. Genitourinary:  Musculoskeletal: No lower extremity tenderness nor edema.  No joint effusions. Neurologic:  Normal speech and language. No gross focal neurologic deficits are appreciated. No facial droop Skin:  Skin is warm, dry and intact. No rash noted. Psychiatric: Mood and affect are normal. Speech and behavior are normal.  ____________________________________________   LABS (all labs ordered are listed, but only abnormal results are displayed)  Results for orders placed or performed during the hospital encounter of 02/23/19 (from the past 24 hour(s))  CBC with Differential     Status: Abnormal   Collection Time: 02/23/19 12:09 PM  Result Value Ref Range   WBC 3.9 (L) 4.0 - 10.5 K/uL   RBC 4.19 3.87 - 5.11 MIL/uL   Hemoglobin 10.2 (L) 12.0 - 15.0 g/dL   HCT 33.1 (L) 36.0 - 46.0 %   MCV 79.0 (L) 80.0 - 100.0 fL   MCH 24.3 (L) 26.0 - 34.0 pg   MCHC 30.8 30.0 - 36.0 g/dL   RDW 17.2 (H) 11.5 - 15.5 %   Platelets 248 150 - 400 K/uL   nRBC 0.0 0.0 - 0.2 %   Neutrophils Relative % 50 %   Neutro Abs 1.9 1.7 - 7.7 K/uL   Lymphocytes Relative 41 %   Lymphs Abs 1.6 0.7 - 4.0 K/uL   Monocytes Relative 6 %   Monocytes Absolute 0.2 0.1 - 1.0 K/uL   Eosinophils Relative 2 %   Eosinophils Absolute  0.1 0.0 - 0.5 K/uL   Basophils Relative 1 %   Basophils Absolute 0.0 0.0 - 0.1 K/uL   Immature Granulocytes 0 %   Abs Immature Granulocytes 0.01 0.00 - 0.07 K/uL  Comprehensive metabolic panel     Status: Abnormal  Collection Time: 02/23/19 12:09 PM  Result Value Ref Range   Sodium 138 135 - 145 mmol/L   Potassium 3.8 3.5 - 5.1 mmol/L   Chloride 102 98 - 111 mmol/L   CO2 28 22 - 32 mmol/L   Glucose, Bld 92 70 - 99 mg/dL   BUN 6 6 - 20 mg/dL   Creatinine, Ser 0.73 0.44 - 1.00 mg/dL   Calcium 8.8 (L) 8.9 - 10.3 mg/dL   Total Protein 6.4 (L) 6.5 - 8.1 g/dL   Albumin 3.7 3.5 - 5.0 g/dL   AST 19 15 - 41 U/L   ALT 26 0 - 44 U/L   Alkaline Phosphatase 56 38 - 126 U/L   Total Bilirubin 0.5 0.3 - 1.2 mg/dL   GFR calc non Af Amer >60 >60 mL/min   GFR calc Af Amer >60 >60 mL/min   Anion gap 8 5 - 15   ____________________________________________ __________________________________  RADIOLOGY  I personally reviewed all radiographic images ordered to evaluate for the above acute complaints and reviewed radiology reports and findings.  These findings were personally discussed with the patient.  Please see medical record for radiology report.  ____________________________________________   PROCEDURES  Procedure(s) performed:  Procedures    Critical Care performed: no ____________________________________________   INITIAL IMPRESSION / ASSESSMENT AND PLAN / ED COURSE  Pertinent labs & imaging results that were available during my care of the patient were reviewed by me and considered in my medical decision making (see chart for details).   DDX: covid 46, flu, enteritis, diverticulitis, c-dif, pna  Misty Zamora is a 43 y.o. who presents to the ED with symptoms as described above.  She is currently afebrile hemodynamically stable and nontoxic.  Does not have any meningismus.  Not consistent with encephalitis or meningitis.  Unlikely sepsis or bacteremia.  Her abdominal exam is  soft and benign.  Have a lower suspicion for C. difficile though she does have a history of this in the past.  Have asked her to provide a stool sample but she does not feel like she can.  Do not feel that CT imaging clinically indicated based on her presentation.  I have a high suspicion for COVID or other viral illness.  Will provide symptomatic treatment.  Clinical Course as of Feb 23 1404  Tue Feb 23, 2019  1401 Patient remains well-appearing in no acute distress.  She asked about receiving antibiotics for possible sinusitis however given her history of C. difficile colitis I am hesitant to initiate antibiotics without any evidence of sepsis as I do still have a high suspicion for viral illness and COVID.  We discussed symptomatic management follow-up with PCP.  Have discussed with the patient and available family all diagnostics and treatments performed thus far and all questions were answered to the best of my ability. The patient demonstrates understanding and agreement with plan.    [PR]    Clinical Course User Index [PR] Merlyn Lot, MD    The patient was evaluated in Emergency Department today for the symptoms described in the history of present illness. He/she was evaluated in the context of the global COVID-19 pandemic, which necessitated consideration that the patient might be at risk for infection with the SARS-CoV-2 virus that causes COVID-19. Institutional protocols and algorithms that pertain to the evaluation of patients at risk for COVID-19 are in a state of rapid change based on information released by regulatory bodies including the CDC and federal and state organizations. These policies  and algorithms were followed during the patient's care in the ED.  As part of my medical decision making, I reviewed the following data within the [facility] notes reviewed and incorporated, Labs reviewed, notes from prior ED visits and  Controlled Substance  Database   ____________________________________________   FINAL CLINICAL IMPRESSION(S) / ED DIAGNOSES  Final diagnoses:  Influenza-like illness  Nasal congestion      NEW MEDICATIONS STARTED DURING THIS VISIT:  New Prescriptions   ALBUTEROL (VENTOLIN HFA) 108 (90 BASE) MCG/ACT INHALER    Inhale 2 puffs into the lungs every 6 (six) hours as needed for wheezing or shortness of breath.   ONDANSETRON (ZOFRAN) 4 MG TABLET    Take 1 tablet (4 mg total) by mouth daily as needed.     Note:  This document was prepared using Dragon voice recognition software and may include unintentional dictation errors.    Merlyn Lot, MD 02/23/19 [phone]

## 2019-02-23 NOTE — ED Notes (Signed)
Patient ambulatory to lobby with steady gait and NAD noted. Verbalized understanding of discharge instructions and follow-up care.  

## 2019-02-26 ENCOUNTER — Ambulatory Visit
Admission: RE | Admit: 2019-02-26 | Discharge: 2019-02-26 | Disposition: A | Discharge status: Home / Self Care | Payer: Managed Care, Other (non HMO) | Source: Ambulatory Visit | Attending: Infectious Diseases | Admitting: Infectious Diseases

## 2019-02-26 ENCOUNTER — Other Ambulatory Visit: Payer: Self-pay

## 2019-02-26 ENCOUNTER — Other Ambulatory Visit: Payer: Self-pay | Admitting: Infectious Diseases

## 2019-02-26 DIAGNOSIS — K921 Melena: Secondary | ICD-10-CM

## 2019-02-26 DIAGNOSIS — D649 Anemia, unspecified: Secondary | ICD-10-CM | POA: Insufficient documentation

## 2019-02-26 DIAGNOSIS — R1084 Generalized abdominal pain: Secondary | ICD-10-CM

## 2019-02-26 MED ORDER — IOHEXOL 300 MG/ML  SOLN
100.0000 mL | Freq: Once | INTRAMUSCULAR | Status: AC | PRN
  Administered 2019-02-26: 16:00:00 100 mL via INTRAVENOUS

## 2019-04-16 ENCOUNTER — Other Ambulatory Visit: Payer: Self-pay | Admitting: Internal Medicine

## 2019-04-16 DIAGNOSIS — R1013 Epigastric pain: Secondary | ICD-10-CM

## 2019-04-16 DIAGNOSIS — K3189 Other diseases of stomach and duodenum: Secondary | ICD-10-CM

## 2019-04-20 ENCOUNTER — Other Ambulatory Visit: Payer: Self-pay

## 2019-04-20 ENCOUNTER — Ambulatory Visit
Admission: RE | Admit: 2019-04-20 | Discharge: 2019-04-20 | Disposition: A | Discharge status: Home / Self Care | Payer: Managed Care, Other (non HMO) | Source: Ambulatory Visit | Attending: Internal Medicine | Admitting: Internal Medicine

## 2019-04-20 DIAGNOSIS — R1013 Epigastric pain: Secondary | ICD-10-CM | POA: Diagnosis present

## 2019-04-20 DIAGNOSIS — K3189 Other diseases of stomach and duodenum: Secondary | ICD-10-CM | POA: Diagnosis not present

## 2019-05-11 ENCOUNTER — Other Ambulatory Visit: Payer: Self-pay | Admitting: Podiatry

## 2019-06-18 ENCOUNTER — Other Ambulatory Visit: Payer: Managed Care, Other (non HMO) | Attending: Podiatry

## 2019-08-10 ENCOUNTER — Ambulatory Visit: Admit: 2019-08-10 | Payer: Managed Care, Other (non HMO) | Admitting: Podiatry

## 2019-08-10 SURGERY — BUNIONECTOMY
Anesthesia: Choice | Site: Toe | Laterality: Right

## 2019-09-27 ENCOUNTER — Telehealth: Payer: Self-pay | Admitting: Psychiatry

## 2019-10-14 ENCOUNTER — Other Ambulatory Visit: Payer: Self-pay

## 2019-10-14 ENCOUNTER — Encounter: Payer: Self-pay | Admitting: Psychiatry

## 2019-10-14 ENCOUNTER — Telehealth (INDEPENDENT_AMBULATORY_CARE_PROVIDER_SITE_OTHER): Payer: Medicaid Other | Admitting: Psychiatry

## 2019-10-14 DIAGNOSIS — F3342 Major depressive disorder, recurrent, in full remission: Secondary | ICD-10-CM

## 2019-10-14 DIAGNOSIS — F419 Anxiety disorder, unspecified: Secondary | ICD-10-CM | POA: Diagnosis not present

## 2019-10-14 DIAGNOSIS — F3341 Major depressive disorder, recurrent, in partial remission: Secondary | ICD-10-CM | POA: Insufficient documentation

## 2019-10-14 NOTE — Addendum Note (Signed)
Addended by: [place] Crane on: 10/14/2019 03:21 PM   Modules accepted: Level of Service
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
## 2019-10-19 ENCOUNTER — Ambulatory Visit: Payer: Self-pay | Admitting: Podiatry

## 2019-11-09 ENCOUNTER — Ambulatory Visit: Payer: Medicaid Other

## 2019-11-09 ENCOUNTER — Encounter: Payer: Self-pay | Admitting: Podiatry

## 2019-11-09 DIAGNOSIS — M2011 Hallux valgus (acquired), right foot: Secondary | ICD-10-CM

## 2019-11-15 NOTE — Progress Notes (Signed)
This encounter was created in error - please disregard.

## 2019-12-22 ENCOUNTER — Ambulatory Visit: Payer: Medicaid Other | Admitting: Psychiatry

## 2019-12-22 ENCOUNTER — Encounter (HOSPITAL_COMMUNITY): Payer: Self-pay | Admitting: Psychiatry

## 2019-12-22 ENCOUNTER — Telehealth (INDEPENDENT_AMBULATORY_CARE_PROVIDER_SITE_OTHER): Payer: Medicaid Other | Admitting: Psychiatry

## 2019-12-22 ENCOUNTER — Other Ambulatory Visit: Payer: Self-pay

## 2019-12-22 DIAGNOSIS — F3342 Major depressive disorder, recurrent, in full remission: Secondary | ICD-10-CM | POA: Insufficient documentation

## 2019-12-22 DIAGNOSIS — F419 Anxiety disorder, unspecified: Secondary | ICD-10-CM | POA: Diagnosis not present

## 2019-12-22 MED ORDER — BUSPIRONE HCL 10 MG PO TABS: 10.0000 mg | ORAL_TABLET | Freq: Three times a day (TID) | ORAL | 0 refills | Status: AC

## 2019-12-22 MED ORDER — VENLAFAXINE HCL 75 MG PO TABS: 75.0000 mg | ORAL_TABLET | Freq: Two times a day (BID) | ORAL | 0 refills | Status: AC

## 2019-12-22 MED ORDER — CLONAZEPAM 1 MG PO TABS: 1.0000 mg | ORAL_TABLET | Freq: Two times a day (BID) | ORAL | 1 refills | Status: AC

## 2019-12-22 MED ORDER — LAMOTRIGINE 100 MG PO TABS: 100.0000 mg | ORAL_TABLET | Freq: Two times a day (BID) | ORAL | 0 refills | Status: AC

## 2019-12-22 MED ORDER — TRAZODONE HCL 50 MG PO TABS: 50.0000 mg | ORAL_TABLET | Freq: Every evening | ORAL | 0 refills | Status: AC | PRN

## 2019-12-22 NOTE — Progress Notes (Signed)
BH MD/PA/NP OP Progress Note  Virtual Visit via Video Note  I connected with Misty Zamora on 12/22/19 at  9:00 AM EDT by a video enabled telemedicine application and verified that I am speaking with the correct person using two identifiers.  Location: Patient: Home Provider: Clinic   I discussed the limitations of evaluation and management by telemedicine and the availability of in person appointments. The patient expressed understanding and agreed to proceed.  I provided 16 minutes of non-face-to-face time during this encounter.    12/22/2019 9:23 AM Keith Rake  MRN:  [id number]   Chief Complaint:  " I am doing fine."  HPI: 44 year old female patient contacted today for follow-up psychiatric appointment for medication management.  Patient reports that she is doing well at this time and her medications have been effective in managing her symptoms. Patient denies any depressive symptoms today.  Patient reports that her anxiety has been well controlled on her klonopin medication.  Patient reports that she is taking her klonopin medication only as needed and she is agreeable to decrease her PRN dose frequency from three times a day to twice a day at this time.  Patient denies any sleep concerns and reports occasional use of trazodone medication as needed at bedtime. Patient denies any need for other medication adjustments and she is agreeable to continue all of her other medications at this time.  Patient reports that she is currently in [place] visiting family for the next few weeks.  She reports that she visits [place] frequently as a therapeutic way to reduce some of her stress related to her ongoing divorce.  Patient denies any concerns at this time or any acute changes since her last visit.    Visit Diagnosis:    ICD-10-CM   1. Recurrent major depressive disorder, in full remission ([place])  F33.42   2. Anxiety  F41.9     Past Psychiatric History: Depression and anxiety  Past  Medical History:  Past Medical History:  Diagnosis Date  . Anemia    had iron infusions. last one in Sept. told levels are now normal  . Anxiety   . 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 Bulging lumbar disc    causes bilateral leg pain  . Depression   . Family history of adverse reaction to anesthesia    Father - PONV  . GERD (gastroesophageal reflux disease)    occasional  . Heart murmur 2005  . History of cardiovascular stress test 2005   showed MR and TR  . History of kidney stones   . Hypertension    no meds since gastric bypass  . IDA (iron deficiency anemia)   . PONV (postoperative nausea and vomiting)    hypotension with epidural  . Sleep apnea    was better after gastric bypass, hasd recently started snoring again.  . Wears contact lenses     Past Surgical History:  Procedure Laterality Date  . ABDOMINAL HYSTERECTOMY  12/2014   ovaries in situ  . BALLOON DILATION  08/29/2017   Procedure: BALLOON DILATION;  Surgeon: [name], MD;  Location: [facility];  Service: Endoscopy;;  . BILATERAL SALPINGECTOMY Bilateral 12/27/2014   Procedure: BILATERAL SALPINGECTOMY;  Surgeon: Will Bonnet, MD;  Location: [facility];  Service: Gynecology;  Laterality: Bilateral;  . COLON SURGERY  08/01/2017   Resection and revision of Bypass - [facility], Dr Duke Salvia  . COLONOSCOPY  2010  . CYSTOSCOPY N/A 12/27/2014   Procedure:  CYSTOSCOPY;  Surgeon: Will Bonnet, MD;  Location: [facility];  Service: Gynecology;  Laterality: N/A;  . DILATION AND CURETTAGE OF UTERUS    . ELBOW ARTHROSCOPY Left 03/30/2015   Procedure: ARTHROSCOPY ELBOW WITH DEBRIDEMENT OF THE SYMPTOMATIC PLICA;  Surgeon: Corky Mull, MD;  Location: [facility];  Service: Orthopedics;  Laterality: Left;  . ELBOW SURGERY Left   . ERCP    . ESOPHAGOGASTRODUODENOSCOPY (EGD) WITH PROPOFOL N/A 06/06/2016   Procedure: ESOPHAGOGASTRODUODENOSCOPY (EGD) WITH PROPOFOL;  Surgeon: [name], MD;  Location:  [facility];  Service: Endoscopy;  Laterality: N/A;  . ESOPHAGOGASTRODUODENOSCOPY (EGD) WITH PROPOFOL N/A 08/29/2017   Procedure: ESOPHAGOGASTRODUODENOSCOPY (EGD) WITH PROPOFOL;  Surgeon: [name], MD;  Location: [facility];  Service: Endoscopy;  Laterality: N/A;  . ESOPHAGOGASTRODUODENOSCOPY (EGD) WITH PROPOFOL N/A 01/08/2018   Procedure: ESOPHAGOGASTRODUODENOSCOPY (EGD) WITH PROPOFOL;  Surgeon: Lin Landsman, MD;  Location: [facility];  Service: Gastroenterology;  Laterality: N/A;  . ESOPHAGOGASTRODUODENOSCOPY (EGD) WITH PROPOFOL N/A 02/06/2018   Procedure: ESOPHAGOGASTRODUODENOSCOPY (EGD) WITH PROPOFOL with dilation;  Surgeon: [name], MD;  Location: [facility];  Service: Endoscopy;  Laterality: N/A;  . gallbladder removed    . GASTRIC BYPASS  2012  . LAPAROSCOPIC HYSTERECTOMY N/A 12/27/2014   Procedure: HYSTERECTOMY TOTAL LAPAROSCOPIC;  Surgeon: Will Bonnet, MD;  Location: [facility];  Service: Gynecology;  Laterality: N/A;  . laproscopy    . RENAL ENDOSCOPY VIA NEPHROSTOMY / PYELOSTOMY  2010  . TUBAL LIGATION      Family Psychiatric History: Denies  Family History:  Family History  Problem Relation Age of Onset  . Hypertension Mother   . Hyperlipidemia Mother   . Diabetes Mother   . Cancer Mother        breast cancer; 04/2011  . Colon polyps Mother   . Breast cancer Mother 53  . Hyperlipidemia Father   . Hypertension Father   . Colon polyps Father   . Hypertension Sister   . Hypertension Brother   . Cancer Maternal Uncle        terminal lung cancer  . Breast cancer Paternal Aunt        73's  . Breast cancer Paternal Grandmother 7  . Breast cancer Maternal Grandmother 93  . Ovarian cancer Other   . Bladder Cancer Neg Hx   . Kidney cancer Neg Hx     Social History:  Social History   Socioeconomic History  . Marital status: Single    Spouse name: Not on file  . Number of children: Not on file  . Years of education: Not  on file  . Highest education level: Not on file  Occupational History    Comment: Full Time  Tobacco Use  . Smoking status: Former Smoker    Packs/day: 0.00    Years: 8.00    Pack years: 0.00    Types: Cigarettes    Quit date: 06/04/1999    Years since quitting: 20.5  . Smokeless tobacco: Never Used  Substance and Sexual Activity  . Alcohol use: No    Comment: 1 drink every few months  . Drug use: No  . Sexual activity: Not on file  Other Topics Concern  . Not on file  Social History Narrative   No regular exercise         Social Determinants of Health   Financial Resource Strain:   . Difficulty of Paying Living Expenses:   Food Insecurity:   . Worried About Running  Out of Food in the Last Year:   . [place] in the Last Year:   Transportation Needs:   . Lack of Transportation (Medical):   Marland Kitchen Lack of Transportation (Non-Medical):   Physical Activity:   . Days of Exercise per Week:   . Minutes of Exercise per Session:   Stress:   . Feeling of Stress :   Social Connections:   . Frequency of Communication with Friends and Family:   . Frequency of Social Gatherings with Friends and Family:   . Attends Religious Services:   . Active Member of Clubs or Organizations:   . Attends Archivist Meetings:   Marland Kitchen Marital Status:     Allergies:  Allergies  Allergen Reactions  . Compazine [Prochlorperazine Edisylate] Other (See Comments)    Patient states it made her feel" very jittery and nervous"  . Ketorolac Palpitations    Metabolic Disorder Labs: Lab Results  Component Value Date   HGBA1C 5.1 07/05/2015   No results found for: PROLACTIN Lab Results  Component Value Date   CHOL 114 01/23/2012   TRIG 50 01/23/2012   HDL 39 (L) 01/23/2012   VLDL 10 01/23/2012   LDLCALC 65 01/23/2012   Lab Results  Component Value Date   TSH 1.407 05/15/2016   TSH 4.500 05/12/2016    Therapeutic Level Labs: No results found for: LITHIUM No results found for:  VALPROATE No components found for:  CBMZ  Current Medications: Current Outpatient Medications  Medication Sig Dispense Refill  . albuterol (VENTOLIN HFA) 108 (90 Base) MCG/ACT inhaler Inhale 2 puffs into the lungs every 6 (six) hours as needed for wheezing or shortness of breath. 8 g 1  . busPIRone (BUSPAR) 10 MG tablet Take 10 mg by mouth 3 (three) times daily.    . clonazePAM (KLONOPIN) 1 MG tablet Take 1 mg by mouth in the morning, at noon, and at bedtime.    . lamoTRIgine (LAMICTAL) 100 MG tablet Take 100 mg by mouth 2 (two) times daily.    . meloxicam (MOBIC) 15 MG tablet Take 1 tablet (15 mg total) by mouth daily. 30 tablet 0  . methocarbamol (ROBAXIN) 500 MG tablet Take 1 tablet (500 mg total) by mouth 4 (four) times daily. 16 tablet 0  . Multiple Vitamins-Minerals (BARIATRIC MULTIVITAMINS/IRON PO) Take by mouth daily.    . ondansetron (ZOFRAN) 4 MG tablet Take 1 tablet (4 mg total) by mouth daily as needed. 14 tablet 0  . Oxycodone HCl 10 MG TABS One pill every 6 hours as needed for pain. Not to exceed 4 pills in a 24 hour period. 120 tablet 0  . predniSONE (DELTASONE) 20 MG tablet Taper as follows: 3 pills for 4 days, two pills for 4 days, one pill for four days 24 tablet 0  . pregabalin (LYRICA) 25 MG capsule Take 1 capsule (25 mg total) by mouth 3 (three) times daily. 90 capsule 1  . traZODone (DESYREL) 50 MG tablet Take 50 mg by mouth at bedtime as needed.    . venlafaxine (EFFEXOR) 75 MG tablet Take 75 mg by mouth 2 (two) times daily.    . Vitamin D, Ergocalciferol, (DRISDOL) 50000 units CAPS capsule TAKE ONE CAPSULE BY MOUTH ONE TIME PER WEEK  4   No current facility-administered medications for this visit.     Musculoskeletal: Strength & Muscle Tone: Unable to assess via telehealth visit Gait & Station: Unable to assess via telehealth visit Patient leans: N/A  Psychiatric  Specialty Exam: Review of Systems  Last menstrual period 12/14/2014.There is no height or weight on  file to calculate BMI.  General Appearance: Casual and Well Groomed  Eye Contact:  Good  Speech:  Clear and Coherent and Normal Rate  Volume:  Normal  Mood:  Euthymic  Affect:  Congruent  Thought Process:  Coherent and Linear  Orientation:  Full (Time, Place, and Person)  Thought Content: Logical   Suicidal Thoughts:  No  Homicidal Thoughts:  No  Memory:  Immediate;   Good Recent;   Good Remote;   Good  Judgement:  Good  Insight:  Good  Psychomotor Activity:  Unable to assess via telehealth visit  Concentration:  Concentration: Good and Attention Span: Good  Recall:  Good  Fund of Knowledge: Good  Language: Good  Akathisia:  Negative  Handed:  Right  AIMS (if indicated): Not done  Assets:  Communication Skills Desire for Improvement Housing Social Support  ADL's:  Intact  Cognition: WNL  Sleep:  Fair   Screenings: PHQ2-9     Office Visit from 01/21/2018 in [facility] Office Visit from 01/31/2016 in [facility] Procedure visit from 12/13/2015 in [facility] Procedure visit from 10/16/2015 in [facility] Office Visit from 10/03/2015 in [facility]  PHQ-2 Total Score 0 0 0 0 0       Assessment and Plan: Patient reports that her medications have been working well to control her anxiety and depressive symptoms.  Patient reports that she is using her Klonopin medication as needed and she is agreeable to decrease the PRN frequency of her medication from three times a day to twice a day. Patient is pleased with all of her other medications and denies the need for additional medication adjustments at this time.  Patient is agreeable to continue her current medication regimen at this time.   1. Recurrent major depressive disorder, in full remission ([place])  - traZODone  (DESYREL) 50 MG tablet; Take 1 tablet (50 mg total) by mouth at bedtime as needed.  Dispense: 90 tablet; Refill: 0 - venlafaxine (EFFEXOR) 75 MG tablet; Take 1 tablet (75 mg total) by mouth 2 (two) times daily with a meal.  Dispense: 180 tablet; Refill: 0 - lamoTRIgine (LAMICTAL) 100 MG tablet; Take 1 tablet (100 mg total) by mouth 2 (two) times daily.  Dispense: 180 tablet; Refill: 0  2. Anxiety  - venlafaxine (EFFEXOR) 75 MG tablet; Take 1 tablet (75 mg total) by mouth 2 (two) times daily with a meal.  Dispense: 180 tablet; Refill: 0 - busPIRone (BUSPAR) 10 MG tablet; Take 1 tablet (10 mg total) by mouth 3 (three) times daily.  Dispense: 270 tablet; Refill: 0 - clonazePAM (KLONOPIN) 1 MG tablet; Take 1 tablet (1 mg total) by mouth 2 (two) times daily.  Dispense: 60 tablet; Refill: 1   Continue same medication regimen. Follow-up in 2 months.  [place] Crane, MD 12/22/2019, 9:23 AM

## 2020-02-17 ENCOUNTER — Telehealth (HOSPITAL_COMMUNITY): Payer: Medicaid Other | Admitting: Psychiatry

## 2020-03-20 ENCOUNTER — Telehealth (INDEPENDENT_AMBULATORY_CARE_PROVIDER_SITE_OTHER): Payer: Medicaid Other | Admitting: Psychiatry

## 2020-03-20 ENCOUNTER — Other Ambulatory Visit: Payer: Self-pay

## 2020-03-20 ENCOUNTER — Encounter (HOSPITAL_COMMUNITY): Payer: Self-pay | Admitting: Psychiatry

## 2020-03-20 DIAGNOSIS — F3342 Major depressive disorder, recurrent, in full remission: Secondary | ICD-10-CM | POA: Diagnosis not present

## 2020-03-20 DIAGNOSIS — F419 Anxiety disorder, unspecified: Secondary | ICD-10-CM

## 2020-03-20 NOTE — Progress Notes (Signed)
BH MD/PA/NP OP Progress Note  Virtual Visit via Telephone Note  I connected with Misty Zamora on 03/20/20 at  1:40 PM EDT by telephone and verified that I am speaking with the correct person using two identifiers.  Location: Patient: home Provider: Clinic   I discussed the limitations, risks, security and privacy concerns of performing an evaluation and management service by telephone and the availability of in person appointments. I also discussed with the patient that there may be a patient responsible charge related to this service. The patient expressed understanding and agreed to proceed.   I provided 14 minutes of non-face-to-face time during this encounter.    03/20/2020 1:34 PM Misty Zamora  MRN:  [id number]   Chief Complaint:  " Everything is going well for now."  HPI: Patient reported that she is doing well overall.  She informed that she is currently in [place] with her parents.  Her divorce was finalized last week.  She has a few moments here and there but overall she is doing better.  She stated that her PCP has been filling her prescriptions and she does not really think she needs to be seen by psychiatry.  However later she stated that maybe she will touch base with the writer in a few months.  She stated that her plan is to get off of most of the medicines in the near future.  She asked if that would ever be possible. Writer advised that since she just underwent an divorce and also the winters are arriving maybe it will be a great idea to continue the same regimen and then touch base in 3 to 4 months to discuss that.  Patient was agreeable to this recommendation.     Visit Diagnosis:    ICD-10-CM   1. Recurrent major depressive disorder, in full remission ([place])  F33.42   2. Anxiety  F41.9     Past Psychiatric History: Depression and anxiety  Past Medical History:  Past Medical History:  Diagnosis Date  . Anemia    had iron infusions. last one in Sept. told  levels are now normal  . Anxiety   . 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 Bulging lumbar disc    causes bilateral leg pain  . Depression   . Family history of adverse reaction to anesthesia    Father - PONV  . GERD (gastroesophageal reflux disease)    occasional  . Heart murmur 2005  . History of cardiovascular stress test 2005   showed MR and TR  . History of kidney stones   . Hypertension    no meds since gastric bypass  . IDA (iron deficiency anemia)   . PONV (postoperative nausea and vomiting)    hypotension with epidural  . Sleep apnea    was better after gastric bypass, hasd recently started snoring again.  . Wears contact lenses     Past Surgical History:  Procedure Laterality Date  . ABDOMINAL HYSTERECTOMY  12/2014   ovaries in situ  . BALLOON DILATION  08/29/2017   Procedure: BALLOON DILATION;  Surgeon: [name], MD;  Location: [facility];  Service: Endoscopy;;  . BILATERAL SALPINGECTOMY Bilateral 12/27/2014   Procedure: BILATERAL SALPINGECTOMY;  Surgeon: Will Bonnet, MD;  Location: [facility];  Service: Gynecology;  Laterality: Bilateral;  . COLON SURGERY  08/01/2017   Resection and revision of Bypass - [facility], Dr Duke Salvia  . COLONOSCOPY  2010  . CYSTOSCOPY N/A 12/27/2014  Procedure: CYSTOSCOPY;  Surgeon: Will Bonnet, MD;  Location: [facility];  Service: Gynecology;  Laterality: N/A;  . DILATION AND CURETTAGE OF UTERUS    . ELBOW ARTHROSCOPY Left 03/30/2015   Procedure: ARTHROSCOPY ELBOW WITH DEBRIDEMENT OF THE SYMPTOMATIC PLICA;  Surgeon: Corky Mull, MD;  Location: [facility];  Service: Orthopedics;  Laterality: Left;  . ELBOW SURGERY Left   . ERCP    . ESOPHAGOGASTRODUODENOSCOPY (EGD) WITH PROPOFOL N/A 06/06/2016   Procedure: ESOPHAGOGASTRODUODENOSCOPY (EGD) WITH PROPOFOL;  Surgeon: [name], MD;  Location: [facility];  Service: Endoscopy;  Laterality: N/A;  . ESOPHAGOGASTRODUODENOSCOPY (EGD) WITH PROPOFOL N/A  08/29/2017   Procedure: ESOPHAGOGASTRODUODENOSCOPY (EGD) WITH PROPOFOL;  Surgeon: [name], MD;  Location: [facility];  Service: Endoscopy;  Laterality: N/A;  . ESOPHAGOGASTRODUODENOSCOPY (EGD) WITH PROPOFOL N/A 01/08/2018   Procedure: ESOPHAGOGASTRODUODENOSCOPY (EGD) WITH PROPOFOL;  Surgeon: Lin Landsman, MD;  Location: [facility];  Service: Gastroenterology;  Laterality: N/A;  . ESOPHAGOGASTRODUODENOSCOPY (EGD) WITH PROPOFOL N/A 02/06/2018   Procedure: ESOPHAGOGASTRODUODENOSCOPY (EGD) WITH PROPOFOL with dilation;  Surgeon: [name], MD;  Location: [facility];  Service: Endoscopy;  Laterality: N/A;  . gallbladder removed    . GASTRIC BYPASS  2012  . LAPAROSCOPIC HYSTERECTOMY N/A 12/27/2014   Procedure: HYSTERECTOMY TOTAL LAPAROSCOPIC;  Surgeon: Will Bonnet, MD;  Location: [facility];  Service: Gynecology;  Laterality: N/A;  . laproscopy    . RENAL ENDOSCOPY VIA NEPHROSTOMY / PYELOSTOMY  2010  . TUBAL LIGATION      Family Psychiatric History: Denies  Family History:  Family History  Problem Relation Age of Onset  . Hypertension Mother   . Hyperlipidemia Mother   . Diabetes Mother   . Cancer Mother        breast cancer; 04/2011  . Colon polyps Mother   . Breast cancer Mother 42  . Hyperlipidemia Father   . Hypertension Father   . Colon polyps Father   . Hypertension Sister   . Hypertension Brother   . Cancer Maternal Uncle        terminal lung cancer  . Breast cancer Paternal Aunt        32's  . Breast cancer Paternal Grandmother 86  . Breast cancer Maternal Grandmother 1  . Ovarian cancer Other   . Bladder Cancer Neg Hx   . Kidney cancer Neg Hx     Social History:  Social History   Socioeconomic History  . Marital status: Single    Spouse name: Not on file  . Number of children: Not on file  . Years of education: Not on file  . Highest education level: Not on file  Occupational History    Comment: Full Time  Tobacco Use  .  Smoking status: Former Smoker    Packs/day: 0.00    Years: 8.00    Pack years: 0.00    Types: Cigarettes    Quit date: 06/04/1999    Years since quitting: 20.8  . Smokeless tobacco: Never Used  Substance and Sexual Activity  . Alcohol use: No    Comment: 1 drink every few months  . Drug use: No  . Sexual activity: Not on file  Other Topics Concern  . Not on file  Social History Narrative   No regular exercise         Social Determinants of Health   Financial Resource Strain:   . Difficulty of Paying Living Expenses: Not on file  Food Insecurity:   .  Worried About Charity fundraiser in the Last Year: Not on file  . Ran Out of Food in the Last Year: Not on file  Transportation Needs:   . Lack of Transportation (Medical): Not on file  . Lack of Transportation (Non-Medical): Not on file  Physical Activity:   . Days of Exercise per Week: Not on file  . Minutes of Exercise per Session: Not on file  Stress:   . Feeling of Stress : Not on file  Social Connections:   . Frequency of Communication with Friends and Family: Not on file  . Frequency of Social Gatherings with Friends and Family: Not on file  . Attends Religious Services: Not on file  . Active Member of Clubs or Organizations: Not on file  . Attends Archivist Meetings: Not on file  . Marital Status: Not on file    Allergies:  Allergies  Allergen Reactions  . Compazine [Prochlorperazine Edisylate] Other (See Comments)    Patient states it made her feel" very jittery and nervous"  . Ketorolac Palpitations    Metabolic Disorder Labs: Lab Results  Component Value Date   HGBA1C 5.1 07/05/2015   No results found for: PROLACTIN Lab Results  Component Value Date   CHOL 114 01/23/2012   TRIG 50 01/23/2012   HDL 39 (L) 01/23/2012   VLDL 10 01/23/2012   LDLCALC 65 01/23/2012   Lab Results  Component Value Date   TSH 1.407 05/15/2016   TSH 4.500 05/12/2016    Therapeutic Level Labs: No results  found for: LITHIUM No results found for: VALPROATE No components found for:  CBMZ  Current Medications: Current Outpatient Medications  Medication Sig Dispense Refill  . albuterol (VENTOLIN HFA) 108 (90 Base) MCG/ACT inhaler Inhale 2 puffs into the lungs every 6 (six) hours as needed for wheezing or shortness of breath. 8 g 1  . busPIRone (BUSPAR) 10 MG tablet Take 1 tablet (10 mg total) by mouth 3 (three) times daily. 270 tablet 0  . clonazePAM (KLONOPIN) 1 MG tablet Take 1 tablet (1 mg total) by mouth 2 (two) times daily. 60 tablet 1  . lamoTRIgine (LAMICTAL) 100 MG tablet Take 1 tablet (100 mg total) by mouth 2 (two) times daily. 180 tablet 0  . meloxicam (MOBIC) 15 MG tablet Take 1 tablet (15 mg total) by mouth daily. 30 tablet 0  . methocarbamol (ROBAXIN) 500 MG tablet Take 1 tablet (500 mg total) by mouth 4 (four) times daily. 16 tablet 0  . Multiple Vitamins-Minerals (BARIATRIC MULTIVITAMINS/IRON PO) Take by mouth daily.    . Oxycodone HCl 10 MG TABS One pill every 6 hours as needed for pain. Not to exceed 4 pills in a 24 hour period. 120 tablet 0  . predniSONE (DELTASONE) 20 MG tablet Taper as follows: 3 pills for 4 days, two pills for 4 days, one pill for four days 24 tablet 0  . pregabalin (LYRICA) 25 MG capsule Take 1 capsule (25 mg total) by mouth 3 (three) times daily. 90 capsule 1  . traZODone (DESYREL) 50 MG tablet Take 1 tablet (50 mg total) by mouth at bedtime as needed. 90 tablet 0  . venlafaxine (EFFEXOR) 75 MG tablet Take 1 tablet (75 mg total) by mouth 2 (two) times daily with a meal. 180 tablet 0  . Vitamin D, Ergocalciferol, (DRISDOL) 50000 units CAPS capsule TAKE ONE CAPSULE BY MOUTH ONE TIME PER WEEK  4   No current facility-administered medications for this visit.  Psychiatric Specialty Exam: Review of Systems  Last menstrual period 12/14/2014.There is no height or weight on file to calculate BMI.  General Appearance: unable to assess due to phone visit  Eye  Contact:  unable to assess due to phone visit   Speech:  Clear and Coherent and Normal Rate  Volume:  Normal  Mood:  Euthymic  Affect:  Congruent  Thought Process:  Coherent and Linear  Orientation:  Full (Time, Place, and Person)  Thought Content: Logical   Suicidal Thoughts:  No  Homicidal Thoughts:  No  Memory:  Immediate;   Good Recent;   Good Remote;   Good  Judgement:  Good  Insight:  Good  Psychomotor Activity:  Unable to assess via telehealth visit  Concentration:  Concentration: Good and Attention Span: Good  Recall:  Good  Fund of Knowledge: Good  Language: Good  Akathisia:  Negative  Handed:  Right  AIMS (if indicated): Not done  Assets:  Communication Skills Desire for Improvement Housing Social Support  ADL's:  Intact  Cognition: WNL   Sleep:  Good   Screenings: PHQ2-9     Office Visit from 01/21/2018 in [facility] Office Visit from 01/31/2016 in [facility] Procedure visit from 12/13/2015 in [facility] Procedure visit from 10/16/2015 in [facility] Office Visit from 10/03/2015 in [facility]  PHQ-2 Total Score 0 0 0 0 0       Assessment and Plan: Patient just completed her divorce proceedings.  She is currently in [place] and is planning to come back to [place] in a few months.  We will continue the same regimen for now.  Her PCP has been giving her prescription refills.   1. Recurrent major depressive disorder, in full remission ([place])  - traZODone (DESYREL) 50 MG tablet; Take 1 tablet (50 mg total) by mouth at bedtime as needed.  Dispense: 90 tablet; Refill: 0 - venlafaxine (EFFEXOR) 75 MG tablet; Take 1 tablet (75 mg total) by mouth 2 (two) times daily with a meal.  Dispense: 180 tablet; Refill: 0 - lamoTRIgine (LAMICTAL) 100 MG tablet; Take 1  tablet (100 mg total) by mouth 2 (two) times daily.  Dispense: 180 tablet; Refill: 0  2. Anxiety  - venlafaxine (EFFEXOR) 75 MG tablet; Take 1 tablet (75 mg total) by mouth 2 (two) times daily with a meal.  Dispense: 180 tablet; Refill: 0 - busPIRone (BUSPAR) 10 MG tablet; Take 1 tablet (10 mg total) by mouth 3 (three) times daily.  Dispense: 270 tablet; Refill: 0 - clonazePAM (KLONOPIN) 1 MG tablet; Take 1 tablet (1 mg total) by mouth 2 (two) times daily.  Dispense: 60 tablet; Refill: 1   Continue same medication regimen. Follow-up in 4 months.  [place] Crane, MD 03/20/2020, 1:34 PM

## 2020-04-11 ENCOUNTER — Telehealth (HOSPITAL_COMMUNITY): Payer: Self-pay | Admitting: Psychiatry

## 2020-04-11 ENCOUNTER — Telehealth (HOSPITAL_COMMUNITY): Payer: Self-pay

## 2020-04-11 NOTE — Telephone Encounter (Signed)
Hi Ms. Collie Siad, can you call this pt back. I believe she is residing In [place] now so advice her to see a local provider there. If she wants to be seen sooner by the writer then offer earliest available appt. Thanks.

## 2020-04-11 NOTE — Telephone Encounter (Signed)
error 

## 2020-05-16 ENCOUNTER — Other Ambulatory Visit: Payer: Self-pay

## 2020-05-16 ENCOUNTER — Telehealth (HOSPITAL_COMMUNITY): Payer: Medicaid Other | Admitting: Psychiatry

## 2020-05-16 ENCOUNTER — Telehealth (HOSPITAL_COMMUNITY): Payer: Self-pay | Admitting: Psychiatry

## 2020-05-16 NOTE — Telephone Encounter (Signed)
I sent virtual visit link  X 2 and then called her phone x 2 to contact her for the virtual appointment today. However, there was no response. I checked PMP RX aware. Per the website, she recently filled # 90 tabs of clonazepam on 05/08/20 from a pharmacy in [place] written by a MD in [place]. She also filled a Rx for oxycodone by the same MD on 03/30/20.  If the pt contacts the clinic again for an appointment please inform her that she is considered a no-show for today and that she needs to find a psychiatrist based in [place] for her further psychiatric care.

## 2020-07-12 ENCOUNTER — Telehealth (HOSPITAL_COMMUNITY): Payer: Medicaid Other | Admitting: Psychiatry

## 2021-03-01 ENCOUNTER — Encounter: Payer: Self-pay | Admitting: General Surgery

## 2021-10-25 ENCOUNTER — Encounter: Payer: Self-pay | Admitting: Internal Medicine

## 2021-10-31 ENCOUNTER — Encounter: Payer: Self-pay | Admitting: Internal Medicine

## 2021-11-01 ENCOUNTER — Encounter: Payer: Self-pay | Admitting: Internal Medicine

## 2021-11-09 ENCOUNTER — Encounter: Payer: Self-pay | Admitting: Internal Medicine

## 2021-11-14 ENCOUNTER — Encounter: Payer: Self-pay | Admitting: Internal Medicine

## 2022-05-16 ENCOUNTER — Encounter: Payer: Self-pay | Admitting: Internal Medicine

## 2022-05-18 ENCOUNTER — Encounter: Payer: Self-pay | Admitting: Internal Medicine
